# Patient Record
Sex: Male | Born: 1940 | Race: White | Hispanic: No | Marital: Married | Smoking: Never smoker
Health system: Southern US, Community
[De-identification: ages and names within clinical notes are randomized; demographics above are authoritative.]

## PROBLEM LIST (undated history)

## (undated) DIAGNOSIS — I771 Stricture of artery: Secondary | ICD-10-CM

## (undated) DIAGNOSIS — I255 Ischemic cardiomyopathy: Secondary | ICD-10-CM

## (undated) DIAGNOSIS — C61 Malignant neoplasm of prostate: Secondary | ICD-10-CM

## (undated) DIAGNOSIS — Z951 Presence of aortocoronary bypass graft: Secondary | ICD-10-CM

## (undated) DIAGNOSIS — I7121 Aneurysm of the ascending aorta, without rupture: Secondary | ICD-10-CM

## (undated) DIAGNOSIS — Z953 Presence of xenogenic heart valve: Secondary | ICD-10-CM

## (undated) DIAGNOSIS — M1990 Unspecified osteoarthritis, unspecified site: Secondary | ICD-10-CM

## (undated) DIAGNOSIS — I2510 Atherosclerotic heart disease of native coronary artery without angina pectoris: Secondary | ICD-10-CM

## (undated) DIAGNOSIS — I6522 Occlusion and stenosis of left carotid artery: Secondary | ICD-10-CM

## (undated) DIAGNOSIS — I351 Nonrheumatic aortic (valve) insufficiency: Secondary | ICD-10-CM

## (undated) DIAGNOSIS — I712 Thoracic aortic aneurysm, without rupture: Secondary | ICD-10-CM

## (undated) DIAGNOSIS — Z9889 Other specified postprocedural states: Secondary | ICD-10-CM

## (undated) DIAGNOSIS — Z8679 Personal history of other diseases of the circulatory system: Secondary | ICD-10-CM

## (undated) DIAGNOSIS — I6521 Occlusion and stenosis of right carotid artery: Secondary | ICD-10-CM

## (undated) DIAGNOSIS — Z8719 Personal history of other diseases of the digestive system: Secondary | ICD-10-CM

## (undated) HISTORY — PX: TRANSURETHRAL RESECTION OF PROSTATE: SHX73

## (undated) HISTORY — DX: Ischemic cardiomyopathy: I25.5

## (undated) HISTORY — DX: Atherosclerotic heart disease of native coronary artery without angina pectoris: I25.10

---

## 2012-07-10 DIAGNOSIS — Z8546 Personal history of malignant neoplasm of prostate: Secondary | ICD-10-CM | POA: Insufficient documentation

## 2016-08-30 DIAGNOSIS — F39 Unspecified mood [affective] disorder: Secondary | ICD-10-CM | POA: Insufficient documentation

## 2016-08-30 DIAGNOSIS — M810 Age-related osteoporosis without current pathological fracture: Secondary | ICD-10-CM | POA: Insufficient documentation

## 2016-08-30 DIAGNOSIS — K219 Gastro-esophageal reflux disease without esophagitis: Secondary | ICD-10-CM | POA: Insufficient documentation

## 2016-08-30 DIAGNOSIS — G5791 Unspecified mononeuropathy of right lower limb: Secondary | ICD-10-CM | POA: Insufficient documentation

## 2016-12-04 ENCOUNTER — Inpatient Hospital Stay (HOSPITAL_COMMUNITY)
Admission: AD | Admit: 2016-12-04 | Discharge: 2016-12-16 | DRG: 216 | Disposition: A | Discharge status: Home Health | Payer: Medicare Other | Source: Other Acute Inpatient Hospital | Attending: Thoracic Surgery (Cardiothoracic Vascular Surgery) | Admitting: Thoracic Surgery (Cardiothoracic Vascular Surgery)

## 2016-12-04 ENCOUNTER — Inpatient Hospital Stay
Admission: EM | Admit: 2016-12-04 | Discharge: 2016-12-04 | DRG: 280 | Disposition: A | Discharge status: Other Acute Inpatient Hospital | Payer: Medicare Other | Source: Other Acute Inpatient Hospital | Attending: Internal Medicine | Admitting: Internal Medicine

## 2016-12-04 ENCOUNTER — Inpatient Hospital Stay: Payer: Medicare Other

## 2016-12-04 ENCOUNTER — Encounter
Admission: EM | Disposition: A | Discharge status: Other Acute Inpatient Hospital | Payer: Self-pay | Source: Other Acute Inpatient Hospital | Attending: Internal Medicine

## 2016-12-04 ENCOUNTER — Inpatient Hospital Stay
Admission: EM | Admit: 2016-12-04 | Payer: Medicare Other | Source: Other Acute Inpatient Hospital | Admitting: Internal Medicine

## 2016-12-04 DIAGNOSIS — I771 Stricture of artery: Secondary | ICD-10-CM | POA: Diagnosis present

## 2016-12-04 DIAGNOSIS — I130 Hypertensive heart and chronic kidney disease with heart failure and stage 1 through stage 4 chronic kidney disease, or unspecified chronic kidney disease: Secondary | ICD-10-CM | POA: Diagnosis present

## 2016-12-04 DIAGNOSIS — K449 Diaphragmatic hernia without obstruction or gangrene: Secondary | ICD-10-CM | POA: Diagnosis not present

## 2016-12-04 DIAGNOSIS — Z7983 Long term (current) use of bisphosphonates: Secondary | ICD-10-CM

## 2016-12-04 DIAGNOSIS — I110 Hypertensive heart disease with heart failure: Secondary | ICD-10-CM | POA: Diagnosis present

## 2016-12-04 DIAGNOSIS — R1033 Periumbilical pain: Secondary | ICD-10-CM | POA: Diagnosis present

## 2016-12-04 DIAGNOSIS — I272 Pulmonary hypertension, unspecified: Secondary | ICD-10-CM | POA: Diagnosis present

## 2016-12-04 DIAGNOSIS — I509 Heart failure, unspecified: Secondary | ICD-10-CM

## 2016-12-04 DIAGNOSIS — Z8679 Personal history of other diseases of the circulatory system: Secondary | ICD-10-CM

## 2016-12-04 DIAGNOSIS — K219 Gastro-esophageal reflux disease without esophagitis: Secondary | ICD-10-CM | POA: Diagnosis present

## 2016-12-04 DIAGNOSIS — I351 Nonrheumatic aortic (valve) insufficiency: Secondary | ICD-10-CM | POA: Diagnosis present

## 2016-12-04 DIAGNOSIS — R410 Disorientation, unspecified: Secondary | ICD-10-CM | POA: Diagnosis not present

## 2016-12-04 DIAGNOSIS — I160 Hypertensive urgency: Secondary | ICD-10-CM | POA: Diagnosis present

## 2016-12-04 DIAGNOSIS — I2510 Atherosclerotic heart disease of native coronary artery without angina pectoris: Secondary | ICD-10-CM | POA: Diagnosis present

## 2016-12-04 DIAGNOSIS — I6523 Occlusion and stenosis of bilateral carotid arteries: Secondary | ICD-10-CM | POA: Diagnosis present

## 2016-12-04 DIAGNOSIS — Z01810 Encounter for preprocedural cardiovascular examination: Secondary | ICD-10-CM | POA: Diagnosis not present

## 2016-12-04 DIAGNOSIS — N170 Acute kidney failure with tubular necrosis: Secondary | ICD-10-CM | POA: Diagnosis not present

## 2016-12-04 DIAGNOSIS — R001 Bradycardia, unspecified: Secondary | ICD-10-CM | POA: Diagnosis not present

## 2016-12-04 DIAGNOSIS — I340 Nonrheumatic mitral (valve) insufficiency: Secondary | ICD-10-CM | POA: Diagnosis not present

## 2016-12-04 DIAGNOSIS — E876 Hypokalemia: Secondary | ICD-10-CM | POA: Diagnosis not present

## 2016-12-04 DIAGNOSIS — R079 Chest pain, unspecified: Secondary | ICD-10-CM

## 2016-12-04 DIAGNOSIS — Z8546 Personal history of malignant neoplasm of prostate: Secondary | ICD-10-CM | POA: Diagnosis not present

## 2016-12-04 DIAGNOSIS — I214 Non-ST elevation (NSTEMI) myocardial infarction: Principal | ICD-10-CM | POA: Diagnosis present

## 2016-12-04 DIAGNOSIS — I6521 Occlusion and stenosis of right carotid artery: Secondary | ICD-10-CM | POA: Diagnosis present

## 2016-12-04 DIAGNOSIS — I5032 Chronic diastolic (congestive) heart failure: Secondary | ICD-10-CM | POA: Diagnosis present

## 2016-12-04 DIAGNOSIS — I714 Abdominal aortic aneurysm, without rupture: Secondary | ICD-10-CM | POA: Diagnosis present

## 2016-12-04 DIAGNOSIS — E1122 Type 2 diabetes mellitus with diabetic chronic kidney disease: Secondary | ICD-10-CM | POA: Diagnosis present

## 2016-12-04 DIAGNOSIS — I25110 Atherosclerotic heart disease of native coronary artery with unstable angina pectoris: Secondary | ICD-10-CM | POA: Diagnosis not present

## 2016-12-04 DIAGNOSIS — I6522 Occlusion and stenosis of left carotid artery: Secondary | ICD-10-CM | POA: Diagnosis present

## 2016-12-04 DIAGNOSIS — E7800 Pure hypercholesterolemia, unspecified: Secondary | ICD-10-CM | POA: Diagnosis not present

## 2016-12-04 DIAGNOSIS — Z9079 Acquired absence of other genital organ(s): Secondary | ICD-10-CM

## 2016-12-04 DIAGNOSIS — H547 Unspecified visual loss: Secondary | ICD-10-CM | POA: Diagnosis not present

## 2016-12-04 DIAGNOSIS — Z7982 Long term (current) use of aspirin: Secondary | ICD-10-CM

## 2016-12-04 DIAGNOSIS — E785 Hyperlipidemia, unspecified: Secondary | ICD-10-CM | POA: Diagnosis not present

## 2016-12-04 DIAGNOSIS — I472 Ventricular tachycardia: Secondary | ICD-10-CM | POA: Diagnosis present

## 2016-12-04 DIAGNOSIS — Z8673 Personal history of transient ischemic attack (TIA), and cerebral infarction without residual deficits: Secondary | ICD-10-CM

## 2016-12-04 DIAGNOSIS — I708 Atherosclerosis of other arteries: Secondary | ICD-10-CM | POA: Diagnosis not present

## 2016-12-04 DIAGNOSIS — Z9889 Other specified postprocedural states: Secondary | ICD-10-CM

## 2016-12-04 DIAGNOSIS — R109 Unspecified abdominal pain: Secondary | ICD-10-CM

## 2016-12-04 DIAGNOSIS — I252 Old myocardial infarction: Secondary | ICD-10-CM

## 2016-12-04 DIAGNOSIS — M1990 Unspecified osteoarthritis, unspecified site: Secondary | ICD-10-CM | POA: Diagnosis present

## 2016-12-04 DIAGNOSIS — I471 Supraventricular tachycardia: Secondary | ICD-10-CM | POA: Diagnosis present

## 2016-12-04 DIAGNOSIS — M1611 Unilateral primary osteoarthritis, right hip: Secondary | ICD-10-CM | POA: Diagnosis not present

## 2016-12-04 DIAGNOSIS — I10 Essential (primary) hypertension: Secondary | ICD-10-CM | POA: Diagnosis not present

## 2016-12-04 DIAGNOSIS — D62 Acute posthemorrhagic anemia: Secondary | ICD-10-CM | POA: Diagnosis not present

## 2016-12-04 DIAGNOSIS — N182 Chronic kidney disease, stage 2 (mild): Secondary | ICD-10-CM | POA: Diagnosis present

## 2016-12-04 DIAGNOSIS — I712 Thoracic aortic aneurysm, without rupture: Secondary | ICD-10-CM | POA: Diagnosis present

## 2016-12-04 DIAGNOSIS — J9811 Atelectasis: Secondary | ICD-10-CM

## 2016-12-04 DIAGNOSIS — Z951 Presence of aortocoronary bypass graft: Secondary | ICD-10-CM

## 2016-12-04 DIAGNOSIS — M545 Low back pain: Secondary | ICD-10-CM | POA: Diagnosis present

## 2016-12-04 DIAGNOSIS — N289 Disorder of kidney and ureter, unspecified: Secondary | ICD-10-CM | POA: Diagnosis present

## 2016-12-04 DIAGNOSIS — I25118 Atherosclerotic heart disease of native coronary artery with other forms of angina pectoris: Secondary | ICD-10-CM | POA: Diagnosis present

## 2016-12-04 DIAGNOSIS — Z79899 Other long term (current) drug therapy: Secondary | ICD-10-CM

## 2016-12-04 DIAGNOSIS — R21 Rash and other nonspecific skin eruption: Secondary | ICD-10-CM | POA: Diagnosis not present

## 2016-12-04 DIAGNOSIS — I5021 Acute systolic (congestive) heart failure: Secondary | ICD-10-CM | POA: Diagnosis present

## 2016-12-04 DIAGNOSIS — Z952 Presence of prosthetic heart valve: Secondary | ICD-10-CM

## 2016-12-04 DIAGNOSIS — Z9689 Presence of other specified functional implants: Secondary | ICD-10-CM

## 2016-12-04 DIAGNOSIS — I739 Peripheral vascular disease, unspecified: Secondary | ICD-10-CM | POA: Diagnosis present

## 2016-12-04 DIAGNOSIS — I7121 Aneurysm of the ascending aorta, without rupture: Secondary | ICD-10-CM | POA: Diagnosis present

## 2016-12-04 HISTORY — DX: Occlusion and stenosis of right carotid artery: I65.21

## 2016-12-04 HISTORY — DX: Malignant neoplasm of prostate: C61

## 2016-12-04 HISTORY — DX: Presence of aortocoronary bypass graft: Z95.1

## 2016-12-04 HISTORY — DX: Presence of xenogenic heart valve: Z95.3

## 2016-12-04 HISTORY — DX: Unspecified osteoarthritis, unspecified site: M19.90

## 2016-12-04 HISTORY — DX: Other specified postprocedural states: Z98.890

## 2016-12-04 HISTORY — DX: Personal history of other diseases of the circulatory system: Z86.79

## 2016-12-04 HISTORY — DX: Thoracic aortic aneurysm, without rupture: I71.2

## 2016-12-04 HISTORY — DX: Personal history of other diseases of the digestive system: Z87.19

## 2016-12-04 HISTORY — DX: Stricture of artery: I77.1

## 2016-12-04 HISTORY — PX: LEFT HEART CATH AND CORONARY ANGIOGRAPHY: CATH118249

## 2016-12-04 HISTORY — DX: Nonrheumatic aortic (valve) insufficiency: I35.1

## 2016-12-04 HISTORY — DX: Occlusion and stenosis of left carotid artery: I65.22

## 2016-12-04 HISTORY — DX: Aneurysm of the ascending aorta, without rupture: I71.21

## 2016-12-04 LAB — COMPREHENSIVE METABOLIC PANEL
ALT: 17 U/L (ref 17–63)
AST: 34 U/L (ref 15–41)
Albumin: 4.2 g/dL (ref 3.5–5.0)
Alkaline Phosphatase: 80 U/L (ref 38–126)
Anion gap: 13 (ref 5–15)
BUN: 21 mg/dL — ABNORMAL HIGH (ref 6–20)
CO2: 24 mmol/L (ref 22–32)
Calcium: 9.3 mg/dL (ref 8.9–10.3)
Chloride: 103 mmol/L (ref 101–111)
Creatinine, Ser: 1.26 mg/dL — ABNORMAL HIGH (ref 0.61–1.24)
GFR calc Af Amer: >60 mL/min (ref >60)
GFR calc non Af Amer: 54 mL/min — ABNORMAL LOW (ref >60)
Glucose, Bld: 110 mg/dL — ABNORMAL HIGH (ref 65–99)
Potassium: 3.3 mmol/L — ABNORMAL LOW (ref 3.5–5.1)
Sodium: 140 mmol/L (ref 135–145)
Total Bilirubin: 0.8 mg/dL (ref 0.3–1.2)
Total Protein: 7.8 g/dL (ref 6.5–8.1)

## 2016-12-04 LAB — CBC
HCT: 36.4 % — ABNORMAL LOW (ref 40.0–52.0)
Hemoglobin: 12.4 g/dL — ABNORMAL LOW (ref 13.0–18.0)
MCH: 30.7 pg (ref 26.0–34.0)
MCHC: 34 g/dL (ref 32.0–36.0)
MCV: 90.5 fL (ref 80.0–100.0)
Platelets: 274 10*3/uL (ref 150–440)
RBC: 4.02 MIL/uL — ABNORMAL LOW (ref 4.40–5.90)
RDW: 14.3 % (ref 11.5–14.5)
WBC: 11.7 10*3/uL — ABNORMAL HIGH (ref 3.8–10.6)

## 2016-12-04 LAB — TSH: TSH: 1.969 u[IU]/mL (ref 0.350–4.500)

## 2016-12-04 LAB — TROPONIN I
TROPONIN I: 0.59 ng/mL — AB (ref <0.03)
Troponin I: 0.57 ng/mL (ref <0.03)
Troponin I: 0.38 ng/mL (ref <0.03)

## 2016-12-04 LAB — HEPARIN LEVEL (UNFRACTIONATED): Heparin Unfractionated: 0.38 IU/mL (ref 0.30–0.70)

## 2016-12-04 LAB — HEMOGLOBIN A1C
Hgb A1c MFr Bld: 5.7 % — ABNORMAL HIGH (ref 4.8–5.6)
Mean Plasma Glucose: 116.89 mg/dL

## 2016-12-04 LAB — PROTIME-INR
INR: 1.03
Prothrombin Time: 13.4 seconds (ref 11.4–15.2)

## 2016-12-04 SURGERY — LEFT HEART CATH AND CORONARY ANGIOGRAPHY
Anesthesia: Moderate Sedation

## 2016-12-04 MED ORDER — ATORVASTATIN CALCIUM 80 MG PO TABS: 80.0000 mg | ORAL_TABLET | Freq: Every day | ORAL | Status: DC

## 2016-12-04 MED ORDER — AMLODIPINE BESYLATE 5 MG PO TABS
5.0000 mg | ORAL_TABLET | Freq: Every day | ORAL | Status: DC
Start: 2016-12-05 — End: 2016-12-07
  Administered 2016-12-05 – 2016-12-06 (×2): 5 mg via ORAL
  Filled 2016-12-04 (×2): qty 1

## 2016-12-04 MED ORDER — ONDANSETRON HCL 4 MG/2ML IJ SOLN: 4.0000 mg | Freq: Four times a day (QID) | INTRAMUSCULAR | Status: DC | PRN

## 2016-12-04 MED ORDER — SODIUM CHLORIDE 0.9% FLUSH: 3.0000 mL | INTRAVENOUS | Status: DC | PRN

## 2016-12-04 MED ORDER — ATORVASTATIN CALCIUM 80 MG PO TABS
80.0000 mg | ORAL_TABLET | Freq: Every day | ORAL | Status: DC
  Administered 2016-12-05 – 2016-12-06 (×2): 80 mg via ORAL
  Filled 2016-12-04 (×2): qty 1

## 2016-12-04 MED ORDER — CARVEDILOL 6.25 MG PO TABS
6.2500 mg | ORAL_TABLET | Freq: Two times a day (BID) | ORAL | Status: DC
  Administered 2016-12-05 – 2016-12-06 (×4): 6.25 mg via ORAL
  Filled 2016-12-04 (×4): qty 1

## 2016-12-04 MED ORDER — VERAPAMIL HCL 2.5 MG/ML IV SOLN
INTRAVENOUS | Status: DC | PRN
  Administered 2016-12-04: 2.5 mg via INTRA_ARTERIAL

## 2016-12-04 MED ORDER — ASPIRIN 81 MG PO CHEW
81.0000 mg | CHEWABLE_TABLET | Freq: Every day | ORAL | Status: DC
  Administered 2016-12-04: 81 mg via ORAL
  Filled 2016-12-04: qty 1

## 2016-12-04 MED ORDER — ACETAMINOPHEN 325 MG PO TABS: 650.0000 mg | ORAL_TABLET | ORAL | Status: DC | PRN

## 2016-12-04 MED ORDER — ACETAMINOPHEN 325 MG PO TABS: 650.0000 mg | ORAL_TABLET | Freq: Four times a day (QID) | ORAL | Status: DC | PRN

## 2016-12-04 MED ORDER — SODIUM CHLORIDE 0.9% FLUSH
3.0000 mL | Freq: Two times a day (BID) | INTRAVENOUS | Status: DC
  Administered 2016-12-04: 3 mL via INTRAVENOUS

## 2016-12-04 MED ORDER — ATORVASTATIN CALCIUM 20 MG PO TABS
80.0000 mg | ORAL_TABLET | Freq: Every day | ORAL | Status: DC
  Administered 2016-12-04: 80 mg via ORAL
  Filled 2016-12-04: qty 4

## 2016-12-04 MED ORDER — SODIUM CHLORIDE 0.9% FLUSH
3.0000 mL | Freq: Two times a day (BID) | INTRAVENOUS | Status: DC
  Administered 2016-12-05 – 2016-12-06 (×3): 3 mL via INTRAVENOUS

## 2016-12-04 MED ORDER — ASPIRIN 81 MG PO CHEW: 81.0000 mg | CHEWABLE_TABLET | ORAL | Status: DC

## 2016-12-04 MED ORDER — MIDAZOLAM HCL 2 MG/2ML IJ SOLN
INTRAMUSCULAR | Status: AC
  Filled 2016-12-04: qty 2

## 2016-12-04 MED ORDER — NITROGLYCERIN 2 % TD OINT: 1.0000 [in_us] | TOPICAL_OINTMENT | Freq: Four times a day (QID) | TRANSDERMAL | 0 refills | Status: DC

## 2016-12-04 MED ORDER — HYDRALAZINE HCL 20 MG/ML IJ SOLN: 10.0000 mg | Freq: Four times a day (QID) | INTRAMUSCULAR | Status: DC | PRN

## 2016-12-04 MED ORDER — SODIUM CHLORIDE 0.9 % IV SOLN: 250.0000 mL | INTRAVENOUS | Status: DC | PRN

## 2016-12-04 MED ORDER — POTASSIUM CHLORIDE 20 MEQ PO PACK: 20.0000 meq | PACK | Freq: Once | ORAL | Status: DC

## 2016-12-04 MED ORDER — ONDANSETRON HCL 4 MG PO TABS: 4.0000 mg | ORAL_TABLET | Freq: Four times a day (QID) | ORAL | Status: DC | PRN

## 2016-12-04 MED ORDER — HEPARIN (PORCINE) IN NACL 2-0.9 UNIT/ML-% IJ SOLN
INTRAMUSCULAR | Status: AC
  Filled 2016-12-04: qty 500

## 2016-12-04 MED ORDER — HEPARIN BOLUS VIA INFUSION
4000.0000 [IU] | Freq: Once | INTRAVENOUS | Status: DC
  Filled 2016-12-04: qty 4000

## 2016-12-04 MED ORDER — SODIUM CHLORIDE 0.9 % WEIGHT BASED INFUSION: 1.0000 mL/kg/h | INTRAVENOUS | Status: DC

## 2016-12-04 MED ORDER — NITROGLYCERIN 0.4 MG SL SUBL: 0.4000 mg | SUBLINGUAL_TABLET | SUBLINGUAL | Status: DC | PRN

## 2016-12-04 MED ORDER — NITROGLYCERIN 2 % TD OINT
1.0000 [in_us] | TOPICAL_OINTMENT | Freq: Four times a day (QID) | TRANSDERMAL | Status: DC
  Administered 2016-12-04: 1 [in_us] via TOPICAL
  Filled 2016-12-04 (×2): qty 1

## 2016-12-04 MED ORDER — ASPIRIN EC 81 MG PO TBEC
81.0000 mg | DELAYED_RELEASE_TABLET | Freq: Every day | ORAL | Status: DC
  Administered 2016-12-05 – 2016-12-06 (×2): 81 mg via ORAL
  Filled 2016-12-04 (×2): qty 1

## 2016-12-04 MED ORDER — HEPARIN SODIUM (PORCINE) 1000 UNIT/ML IJ SOLN
INTRAMUSCULAR | Status: AC
  Filled 2016-12-04: qty 1

## 2016-12-04 MED ORDER — FENTANYL CITRATE (PF) 100 MCG/2ML IJ SOLN
INTRAMUSCULAR | Status: AC
  Filled 2016-12-04: qty 2

## 2016-12-04 MED ORDER — HEPARIN (PORCINE) IN NACL 100-0.45 UNIT/ML-% IJ SOLN
850.0000 [IU]/h | INTRAMUSCULAR | Status: DC
  Administered 2016-12-04: 850 [IU]/h via INTRAVENOUS

## 2016-12-04 MED ORDER — FAMOTIDINE 20 MG PO TABS
20.0000 mg | ORAL_TABLET | Freq: Every day | ORAL | Status: DC
  Administered 2016-12-04: 20 mg via ORAL
  Filled 2016-12-04: qty 1

## 2016-12-04 MED ORDER — SODIUM CHLORIDE 0.9 % WEIGHT BASED INFUSION: 3.0000 mL/kg/h | INTRAVENOUS | Status: DC

## 2016-12-04 MED ORDER — SODIUM CHLORIDE 0.9% FLUSH: 3.0000 mL | Freq: Two times a day (BID) | INTRAVENOUS | Status: DC

## 2016-12-04 MED ORDER — CARVEDILOL 6.25 MG PO TABS: 6.2500 mg | ORAL_TABLET | Freq: Two times a day (BID) | ORAL | Status: DC

## 2016-12-04 MED ORDER — VERAPAMIL HCL 2.5 MG/ML IV SOLN
INTRAVENOUS | Status: AC
  Filled 2016-12-04: qty 2

## 2016-12-04 MED ORDER — IOPAMIDOL (ISOVUE-300) INJECTION 61%
INTRAVENOUS | Status: DC | PRN
  Administered 2016-12-04: 90 mL via INTRA_ARTERIAL

## 2016-12-04 MED ORDER — FENTANYL CITRATE (PF) 100 MCG/2ML IJ SOLN
INTRAMUSCULAR | Status: DC | PRN
  Administered 2016-12-04: 50 ug via INTRAVENOUS

## 2016-12-04 MED ORDER — MIDAZOLAM HCL 2 MG/2ML IJ SOLN
INTRAMUSCULAR | Status: DC | PRN
  Administered 2016-12-04: 1 mg via INTRAVENOUS

## 2016-12-04 MED ORDER — ACETAMINOPHEN 650 MG RE SUPP: 650.0000 mg | Freq: Four times a day (QID) | RECTAL | Status: DC | PRN

## 2016-12-04 MED ORDER — NITROGLYCERIN 2 % TD OINT
1.0000 [in_us] | TOPICAL_OINTMENT | Freq: Four times a day (QID) | TRANSDERMAL | Status: DC
  Administered 2016-12-04 – 2016-12-07 (×10): 1 [in_us] via TOPICAL
  Filled 2016-12-04: qty 30

## 2016-12-04 MED ORDER — DOCUSATE SODIUM 100 MG PO CAPS
100.0000 mg | ORAL_CAPSULE | Freq: Two times a day (BID) | ORAL | Status: DC
  Administered 2016-12-04: 100 mg via ORAL
  Filled 2016-12-04: qty 1

## 2016-12-04 MED ORDER — HEPARIN (PORCINE) IN NACL 100-0.45 UNIT/ML-% IJ SOLN: 850.0000 [IU]/h | INTRAMUSCULAR | Status: DC

## 2016-12-04 MED ORDER — CARVEDILOL 6.25 MG PO TABS
6.2500 mg | ORAL_TABLET | Freq: Two times a day (BID) | ORAL | Status: DC
  Administered 2016-12-04: 6.25 mg via ORAL
  Filled 2016-12-04: qty 1

## 2016-12-04 MED ORDER — SODIUM CHLORIDE 0.9 % IV SOLN
INTRAVENOUS | Status: DC
  Administered 2016-12-04: 06:00:00 via INTRAVENOUS

## 2016-12-04 MED ORDER — AMLODIPINE BESYLATE 5 MG PO TABS
5.0000 mg | ORAL_TABLET | Freq: Every day | ORAL | Status: DC
  Administered 2016-12-04: 5 mg via ORAL
  Filled 2016-12-04: qty 1

## 2016-12-04 MED ORDER — ASPIRIN 81 MG PO CHEW: 81.0000 mg | CHEWABLE_TABLET | Freq: Every day | ORAL | Status: DC

## 2016-12-04 MED ORDER — SODIUM CHLORIDE 0.9 % IV SOLN
INTRAVENOUS | Status: AC
  Administered 2016-12-04: 19:00:00 via INTRAVENOUS

## 2016-12-04 MED ORDER — HEPARIN SODIUM (PORCINE) 1000 UNIT/ML IJ SOLN
INTRAMUSCULAR | Status: DC | PRN
  Administered 2016-12-04: 3500 [IU] via INTRAVENOUS

## 2016-12-04 MED ORDER — HEPARIN (PORCINE) IN NACL 100-0.45 UNIT/ML-% IJ SOLN
850.0000 [IU]/h | INTRAMUSCULAR | Status: DC
  Administered 2016-12-05 – 2016-12-06 (×2): 850 [IU]/h via INTRAVENOUS
  Filled 2016-12-04 (×2): qty 250

## 2016-12-04 SURGICAL SUPPLY — 8 items
CATH AMP RT 5F (CATHETERS) ×3 IMPLANT
CATH INFINITI 5 FR JL3.5 (CATHETERS) ×3 IMPLANT
CATH INFINITI JR4 5F (CATHETERS) ×3 IMPLANT
DEVICE RAD TR BAND REGULAR (VASCULAR PRODUCTS) ×3 IMPLANT
GLIDESHEATH SLEND SS 6F .021 (SHEATH) ×3 IMPLANT
KIT MANI 3VAL PERCEP (MISCELLANEOUS) ×3 IMPLANT
PACK CARDIAC CATH (CUSTOM PROCEDURE TRAY) ×3 IMPLANT
WIRE ROSEN-J .035X260CM (WIRE) ×3 IMPLANT

## 2016-12-04 NOTE — Progress Notes (Signed)
Chaplain responded to an OR for prayer. [place] visit with pt but pt was not available. CH met wife and daughter who states that pt was taken to ultrasound. [place] will follow up pt as needed.   12/04/16 1000  Clinical Encounter Type  Visited With Family  Visit Type Initial;Spiritual support  Referral From Nurse  Consult/Referral To Chaplain  Spiritual Encounters  Spiritual Needs Prayer

## 2016-12-04 NOTE — Care Management (Signed)
Direct admit from [facility] to received cardiac care not available at [facility].

## 2016-12-04 NOTE — H&P (Signed)
Kevin Burgess is an 76 y.o. male.   Chief Complaint: Chest pain HPI: the patient with past medical history of prostate cancer presents to [facility] from [facility] for higher level of care. The patient developed chest pain at rest earlier this evening. Troponin at the other facility was 0.065 and EKG showed ST depressions. Dr. Audelia Acton of [facility] was contacted and agreed the patient may need catheterization. He was placed on a heparin drip en route. Upon arrival the patient was asymptomatic (chest pain resolved hours ago with sublingual nitroglycerin). He has also been short of breath for at least a week. CXR at the outside hospital showed pulmonary edema which improved with Lasix. He has no oxygen requirement at this time.   No past medical history on file.  No past surgical history on file.  No family history on file. Social History:  reports that he has never smoked. He has never used smokeless tobacco. He reports that he does not drink alcohol or use drugs.  Allergies: No Known Allergies  No prescriptions prior to admission.    Results for orders placed or performed during the hospital encounter of 12/04/16 (from the past 48 hour(s))  TSH     Status: None   Collection Time: 12/04/16  5:07 AM  Result Value Ref Range   TSH 1.969 0.350 - 4.500 uIU/mL    Comment: Performed by a 3rd Generation assay with a functional sensitivity of <=0.01 uIU/mL.  Troponin I     Status: Abnormal   Collection Time: 12/04/16  5:07 AM  Result Value Ref Range   Troponin I 0.59 (HH) <0.03 ng/mL    Comment: CRITICAL RESULT CALLED TO, READ BACK BY AND VERIFIED WITH ALISA SCOTT @ 0615 ON 12/04/2016 BY CAF   Comprehensive metabolic panel     Status: Abnormal   Collection Time: 12/04/16  5:07 AM  Result Value Ref Range   Sodium 140 135 - 145 mmol/L   Potassium 3.3 (L) 3.5 - 5.1 mmol/L   Chloride 103 101 - 111 mmol/L   CO2 24 22 - 32 mmol/L   Glucose, Bld 110 (H) 65 - 99 mg/dL   BUN 21 (H) 6 - 20 mg/dL   Creatinine, Ser 1.26 (H) 0.61 - 1.24 mg/dL   Calcium 9.3 8.9 - 10.3 mg/dL   Total Protein 7.8 6.5 - 8.1 g/dL   Albumin 4.2 3.5 - 5.0 g/dL   AST 34 15 - 41 U/L   ALT 17 17 - 63 U/L   Alkaline Phosphatase 80 38 - 126 U/L   Total Bilirubin 0.8 0.3 - 1.2 mg/dL   GFR calc non Af Amer 54 (L) >60 mL/min   GFR calc Af Amer >60 >60 mL/min    Comment: (NOTE) The eGFR has been calculated using the CKD EPI equation. This calculation has not been validated in all clinical situations. eGFR's persistently <60 mL/min signify possible Chronic Kidney Disease.    Anion gap 13 5 - 15  Protime-INR     Status: None   Collection Time: 12/04/16  5:07 AM  Result Value Ref Range   Prothrombin Time 13.4 11.4 - 15.2 seconds   INR 1.03   CBC     Status: Abnormal   Collection Time: 12/04/16  5:07 AM  Result Value Ref Range   WBC 11.7 (H) 3.8 - 10.6 K/uL   RBC 4.02 (L) 4.40 - 5.90 MIL/uL   Hemoglobin 12.4 (L) 13.0 - 18.0 g/dL   HCT 36.4 (L) 40.0 - 52.0 %  MCV 90.5 80.0 - 100.0 fL   MCH 30.7 26.0 - 34.0 pg   MCHC 34.0 32.0 - 36.0 g/dL   RDW 14.3 11.5 - 14.5 %   Platelets 274 150 - 440 K/uL  Heparin level (unfractionated)     Status: None   Collection Time: 12/04/16  5:07 AM  Result Value Ref Range   Heparin Unfractionated 0.38 0.30 - 0.70 IU/mL    Comment:        IF HEPARIN RESULTS ARE BELOW EXPECTED VALUES, AND PATIENT DOSAGE HAS BEEN CONFIRMED, SUGGEST FOLLOW UP TESTING OF ANTITHROMBIN III LEVELS.    No results found.  Review of Systems  Constitutional: Positive for diaphoresis. Negative for chills and fever.  HENT: Negative for sore throat and tinnitus.   Eyes: Negative for blurred vision and redness.  Respiratory: Positive for shortness of breath. Negative for cough.   Cardiovascular: Negative for chest pain, palpitations, orthopnea and PND.  Gastrointestinal: Positive for abdominal pain (resolved). Negative for diarrhea, nausea and vomiting.  Genitourinary: Negative for dysuria,  frequency and urgency.  Musculoskeletal: Negative for joint pain and myalgias.  Skin: Negative for rash.       No lesions  Neurological: Negative for speech change, focal weakness and weakness.  Endo/Heme/Allergies: Does not bruise/bleed easily.       No temperature intolerance  Psychiatric/Behavioral: Negative for depression and suicidal ideas.    Blood pressure (!) 188/86, pulse 93, temperature 98.1 F (36.7 C), temperature source Oral, resp. rate (!) 22, height 5' 7"  (1.702 m), weight 69.8 kg (153 lb 14.4 oz), SpO2 100 %. Physical Exam  Vitals reviewed. Constitutional: He is oriented to person, place, and time. He appears well-developed and well-nourished. No distress.  HENT:  Head: Normocephalic and atraumatic.  Mouth/Throat: Oropharynx is clear and moist.  Eyes: Pupils are equal, round, and reactive to light. Conjunctivae and EOM are normal. No scleral icterus.  Neck: Normal range of motion. Neck supple. No JVD present. No tracheal deviation present. No thyromegaly present.  Cardiovascular: Normal rate, regular rhythm and normal heart sounds.  Exam reveals no gallop and no friction rub.   No murmur heard. Respiratory: Effort normal and breath sounds normal. No respiratory distress. He has no wheezes.  GI: Soft. Bowel sounds are normal. He exhibits no distension. There is no tenderness.  Genitourinary:  Genitourinary Comments: Deferred  Musculoskeletal: Normal range of motion. He exhibits no edema.  Lymphadenopathy:    He has no cervical adenopathy.  Neurological: He is alert and oriented to person, place, and time. No cranial nerve deficit.  Skin: Skin is warm and dry. No rash noted. No erythema.  Psychiatric: He has a normal mood and affect. His behavior is normal. Judgment and thought content normal.     Assessment/Plan This is a 76 year old male admitted for NSTEMI. 1. NSTEMI: anterior lateral ST abnormalities with elevated troponin. Continue heparin drip. He is chest  pain-free. Cardiology is consulted. Monitor telemetry. 2. CHF: acute; systolic. 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 Pulmonary edema improved following Lasix. Assess EF during cath.  3. CAD: troponin trending downward. Check HbA1c and lipids 4. Hypertension: uncontrolled; start Coreg 5. GERD: Pepcid 6. DVT prophylaxis: as above 7. GI prophylaxis: as above The patient is a full code. Time spent on admission orders and patient care approximately 45 minutes.  Harrie Foreman, MD 12/04/2016, 7:20 AM

## 2016-12-04 NOTE — H&P (View-Only) (Signed)
Cardiology Consult    Patient ID: Kevin Burgess MRN: [id number], DOB/AGE: August 19, 1940   Admit date: 12/04/2016 Date of Consult: 12/04/2016  Primary Physician: [name], MD Primary Cardiologist: new - C. End, MD  Requesting Provider: S. Patel  Patient Profile    Kevin Burgess is a 76 y.o. male with a history of prostate cancer and probably untreated HTN, who is being seen today for the evaluation of NSTEMI at the request of Dr. Serita Grit.  Past Medical History   Past Medical History:  Diagnosis Date  . OA (osteoarthritis)    a. Pending R Hip THA  . Prostate cancer ([place])    a. s/p TURP ~ 1999.    Past Surgical History:  Procedure Laterality Date  . TRANSURETHRAL RESECTION OF PROSTATE     a. ~ 1999     Allergies  No Known Allergies  History of Present Illness    76 year old male without prior cardiac history. Does have a history of untreated hypertension, arthritis of the right hip, pending replacement, and prostate cancer status post TURP in 1999. Patient does not typically see doctors but in June of this year, was seen by primary care physician because of lower back and right hip pain. He subsequently underwent imaging which showed osteoarthritis of the right hip and he has been followed by orthopedics. He is tentatively scheduled for right total hip arthroplasty on October 9. His activity has been limited in that setting and recently, his wife has noticed that he has been getting short of breath with activity. He was otherwise in his usual state of health until the evening of September 24, with approximate 6 PM, he developed periumbilical pain that was followed by dyspnea, cough, and clear sputum/foaming from his mouth. The symptoms lasted for about 2+ hours prior to them calling their grandson who then drove him to [facility]. There, his chest x-ray showed pulmonary edema while blood pressure was elevated at 170/91. Troponin was mildly elevated at 0.065. He was  treated with sublingual nitroglycerin upon arrival with complete resolution of all symptoms. He was subsequent transferred to [facility] for further evaluation as there were no beds at [facility], [place], or [place]. Here, patient has been pain-free but has remained hypertensive. Follow-up troponin was higher at 0.59. Of note, though he denies prior episodes of chest discomfort, his wife said that last night he was holding his chest some and in the past has reported chest discomfort as well.  Inpatient Medications    . amLODipine  5 mg Oral Daily  . aspirin  81 mg Oral Daily  . [START ON 12/05/2016] aspirin  81 mg Oral Pre-Cath  . atorvastatin  80 mg Oral q1800  . carvedilol  6.25 mg Oral BID WC  . docusate sodium  100 mg Oral BID  . famotidine  20 mg Oral Daily  . nitroGLYCERIN  1 inch Topical Q6H  . potassium chloride  20 mEq Oral Once  . sodium chloride flush  3 mL Intravenous Q12H    Family History    No premature coronary artery disease. Both parents are deceased.  Social History    Social History   Social History  . Marital status: Married    Spouse name: N/A  . Number of children: N/A  . Years of education: N/A   Occupational History  . 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 Smoking status: Never Smoker  .  Smokeless tobacco: Never Used  . Alcohol use No  . Drug use: No  . Sexual activity: Not on file   Other Topics Concern  . Not on file   Social History Narrative   [place] of [place]  Raises chickens.     Review of Systems    General:  No chills, fever, night sweats or weight changes.  Cardiovascular:  +++ chest pain, +++ dyspnea on exertion, no edema, orthopnea, palpitations, paroxysmal nocturnal dyspnea. Dermatological: No rash, lesions/masses Respiratory: +++ cough, +++ dyspnea Urologic: No hematuria, dysuria Abdominal:   Abdominal/periumbilical discomfort last  night. No nausea, vomiting, diarrhea, bright red blood per rectum, melena, or hematemesis Neurologic:  No visual changes, wkns, changes in mental status. All other systems reviewed and are otherwise negative except as noted above.  Physical Exam    Blood pressure (!) 157/75, pulse 81, temperature 98.3 F (36.8 C), temperature source Oral, resp. rate 18, height 5\' 7"  (1.702 m), weight 153 lb 14.4 oz (69.8 kg), SpO2 95 %.  General: Pleasant, NAD Psych: Normal affect. Neuro: Alert and oriented X 3. Moves all extremities spontaneously. HEENT: Normal  Neck: Supple without bruits or JVD. Lungs:  Resp regular and unlabored, CTA. Heart: RRR no s3, s4, or murmurs. Abdomen: protuberant, firm, non-tender, BS + x 4.  Extremities: No clubbing, cyanosis or edema. DP/PT/Radials 2+ and equal bilaterally.  Labs     Recent Labs  12/04/16 0507 12/04/16 1106  TROPONINI 0.59* 0.57*   Lab Results  Component Value Date   WBC 11.7 (H) 12/04/2016   HGB 12.4 (L) 12/04/2016   HCT 36.4 (L) 12/04/2016   MCV 90.5 12/04/2016   PLT 274 12/04/2016     Recent Labs Lab 12/04/16 0507  NA 140  K 3.3*  CL 103  CO2 24  BUN 21*  CREATININE 1.26*  CALCIUM 9.3  PROT 7.8  BILITOT 0.8  ALKPHOS 80  ALT 17  AST 34  GLUCOSE 110*    Radiology Studies    [place] Retroperitoneal Comp  Result Date: 12/04/2016 CLINICAL DATA:  Abdominal pain. EXAM: ULTRASOUND RETROPERITONEAL COMPLETE TECHNIQUE: Ultrasound examination of the abdominal aorta was performed to evaluate for abdominal aortic aneurysm. The common iliac arteries, IVC, and kidneys were also evaluated. COMPARISON:  None. FINDINGS: Abdominal Aorta No aneurysm identified. Maximum AP Diameter:  2.3 cm. Maximum TRV Diameter: 2.0 cm. Right Common Iliac Artery No aneurysm identified. Left Common Iliac Artery No aneurysm identified. IVC No abnormality visualized. Right Kidney Length: 9.3 cm Echogenicity within normal limits. No mass or hydronephrosis visualized. Left  Kidney Length: 9 cm Echogenicity within normal limits. No mass or hydronephrosis visualized. IMPRESSION: No definite abnormality seen in the visualized retroperitoneum. No abdominal aortic aneurysm is noted. Normal kidneys. Electronically Signed   By: Marijo Conception, M.D.   On: 12/04/2016 10:34    ECG & Cardiac Imaging    Sinus tach, 104, LAE, LVH, repol abnormalities  Assessment & Plan    1.  NSTEMI:  Pt presented on tx from [facility] after presenting with periumbilical pain, HTN urgency, dyspnea, and productive cough.  CXR suggestive of pulm infiltrates/edema.  Ss resolved with sl NTG.  Trop mildly elevated initially.  Tx to [facility].  Trop now 0.59.  No recurrent Ss.  BP remains high - 188/86.  In the setting of periumbilical pain, I did obtain an abdominal vascular ultrasound which shows no evidence of abdominal aortic aneurysm. We will therefore plan on diagnostic catheterization today.  The patient understands that risks include but are not limited to stroke (1 in 1000), death (1 in 43), kidney failure [usually temporary] (1 in 500), bleeding (1 in 200), allergic reaction [possibly serious] (1 in 200), and agrees to proceed.  Cont  blocker.  Adding asa, statin, NTP.  Hold off on ACEI/ARB for now with mild renal insuff.  F/u echo.    2.  Abd Pain:  Pt presented w/ abd/peri-umbilical pain in the setting of marked HTN.  Based on ECG, w/ LVH, he likely has a long h/o untreated HTN (does not routinely seek medical care). Abdominal ultrasound negative for aneurysm.  BP mgmt as below.  3.  Hypertensive Urgency:  In setting of above.  Blood pressure now in the 150s with addition of  blocker, ccb, nitrate, and prn hydralazine.  Eventual ARB but holding off for now in setting of mild renal insuff.  4.  Lipids:  F/u lipids.  Adding high potency statin in setting of #1.  5.  Renal Insufficiency:  ? Chronicity.  Creat mildly elevated @ 1.26.  Follow.  6.  OA:  Pending R THA in [place] currently  sched for 10/9.  As above, needs cardiac eval/cath.  Pending results, surgery may need to be delayed.  Signed, Murray Hodgkins, NP 12/04/2016, 1:15 PM  For questions or updates, please contact   Please consult [email or web address] for contact info under Cardiology/STEMI.

## 2016-12-04 NOTE — Progress Notes (Signed)
Dr Marcille Blanco made aware of troponin results.  No new orders at this time.

## 2016-12-04 NOTE — Consult Note (Signed)
Cardiology Consult    Patient ID: Kevin Burgess MRN: [id number], DOB/AGE: 18-Mar-1940   Admit date: 12/04/2016 Date of Consult: 12/04/2016  Primary Physician: [name], MD Primary Cardiologist: new - C. End, MD  Requesting Provider: S. Patel  Patient Profile    Kevin Burgess is a 76 y.o. male with a history of prostate cancer and probably untreated HTN, who is being seen today for the evaluation of NSTEMI at the request of Dr. Serita Grit.  Past Medical History   Past Medical History:  Diagnosis Date  . OA (osteoarthritis)    a. Pending R Hip THA  . Prostate cancer ([place])    a. s/p TURP ~ 1999.    Past Surgical History:  Procedure Laterality Date  . TRANSURETHRAL RESECTION OF PROSTATE     a. ~ 1999     Allergies  No Known Allergies  History of Present Illness    76 year old male without prior cardiac history. Does have a history of untreated hypertension, arthritis of the right hip, pending replacement, and prostate cancer status post TURP in 1999. Patient does not typically see doctors but in June of this year, was seen by primary care physician because of lower back and right hip pain. He subsequently underwent imaging which showed osteoarthritis of the right hip and he has been followed by orthopedics. He is tentatively scheduled for right total hip arthroplasty on October 9. His activity has been limited in that setting and recently, his wife has noticed that he has been getting short of breath with activity. He was otherwise in his usual state of health until the evening of September 24, with approximate 6 PM, he developed periumbilical pain that was followed by dyspnea, cough, and clear sputum/foaming from his mouth. The symptoms lasted for about 2+ hours prior to them calling their grandson who then drove him to [facility]. There, his chest x-ray showed pulmonary edema while blood pressure was elevated at 170/91. Troponin was mildly elevated at 0.065. He was  treated with sublingual nitroglycerin upon arrival with complete resolution of all symptoms. He was subsequent transferred to [facility] for further evaluation as there were no beds at [facility], [place], or [place]. Here, patient has been pain-free but has remained hypertensive. Follow-up troponin was higher at 0.59. Of note, though he denies prior episodes of chest discomfort, his wife said that last night he was holding his chest some and in the past has reported chest discomfort as well.  Inpatient Medications    . amLODipine  5 mg Oral Daily  . aspirin  81 mg Oral Daily  . [START ON 12/05/2016] aspirin  81 mg Oral Pre-Cath  . atorvastatin  80 mg Oral q1800  . carvedilol  6.25 mg Oral BID WC  . docusate sodium  100 mg Oral BID  . famotidine  20 mg Oral Daily  . nitroGLYCERIN  1 inch Topical Q6H  . potassium chloride  20 mEq Oral Once  . sodium chloride flush  3 mL Intravenous Q12H    Family History    No premature coronary artery disease. Both parents are deceased.  Social History    Social History   Social History  . Marital status: Married    Spouse name: N/A  . Number of children: N/A  . Years of education: N/A   Occupational History  . 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 Smoking status: Never Smoker  .  Smokeless tobacco: Never Used  . Alcohol use No  . Drug use: No  . Sexual activity: Not on file   Other Topics Concern  . Not on file   Social History Narrative   Lives outside of [place] the [place]  Raises chickens.     Review of Systems    General:  No chills, fever, night sweats or weight changes.  Cardiovascular:  +++ chest pain, +++ dyspnea on exertion, no edema, orthopnea, palpitations, paroxysmal nocturnal dyspnea. Dermatological: No rash, lesions/masses Respiratory: +++ cough, +++ dyspnea Urologic: No hematuria, dysuria Abdominal:   Abdominal/periumbilical discomfort last  night. No nausea, vomiting, diarrhea, bright red blood per rectum, melena, or hematemesis Neurologic:  No visual changes, wkns, changes in mental status. All other systems reviewed and are otherwise negative except as noted above.  Physical Exam    Blood pressure (!) 157/75, pulse 81, temperature 98.3 F (36.8 C), temperature source Oral, resp. rate 18, height 5\' 7"  (1.702 m), weight 153 lb 14.4 oz (69.8 kg), SpO2 95 %.  General: Pleasant, NAD Psych: Normal affect. Neuro: Alert and oriented X 3. Moves all extremities spontaneously. HEENT: Normal  Neck: Supple without bruits or JVD. Lungs:  Resp regular and unlabored, CTA. Heart: RRR no s3, s4, or murmurs. Abdomen: protuberant, firm, non-tender, BS + x 4.  Extremities: No clubbing, cyanosis or edema. DP/PT/Radials 2+ and equal bilaterally.  Labs     Recent Labs  12/04/16 0507 12/04/16 1106  TROPONINI 0.59* 0.57*   Lab Results  Component Value Date   WBC 11.7 (H) 12/04/2016   HGB 12.4 (L) 12/04/2016   HCT 36.4 (L) 12/04/2016   MCV 90.5 12/04/2016   PLT 274 12/04/2016     Recent Labs Lab 12/04/16 0507  NA 140  K 3.3*  CL 103  CO2 24  BUN 21*  CREATININE 1.26*  CALCIUM 9.3  PROT 7.8  BILITOT 0.8  ALKPHOS 80  ALT 17  AST 34  GLUCOSE 110*    Radiology Studies    [place] Retroperitoneal Comp  Result Date: 12/04/2016 CLINICAL DATA:  Abdominal pain. EXAM: ULTRASOUND RETROPERITONEAL COMPLETE TECHNIQUE: Ultrasound examination of the abdominal aorta was performed to evaluate for abdominal aortic aneurysm. The common iliac arteries, IVC, and kidneys were also evaluated. COMPARISON:  None. FINDINGS: Abdominal Aorta No aneurysm identified. Maximum AP Diameter:  2.3 cm. Maximum TRV Diameter: 2.0 cm. Right Common Iliac Artery No aneurysm identified. Left Common Iliac Artery No aneurysm identified. IVC No abnormality visualized. Right Kidney Length: 9.3 cm Echogenicity within normal limits. No mass or hydronephrosis visualized. Left  Kidney Length: 9 cm Echogenicity within normal limits. No mass or hydronephrosis visualized. IMPRESSION: No definite abnormality seen in the visualized retroperitoneum. No abdominal aortic aneurysm is noted. Normal kidneys. Electronically Signed   By: Marijo Conception, M.D.   On: 12/04/2016 10:34    ECG & Cardiac Imaging    Sinus tach, 104, LAE, LVH, repol abnormalities  Assessment & Plan    1.  NSTEMI:  Pt presented on tx from [facility] after presenting with periumbilical pain, HTN urgency, dyspnea, and productive cough.  CXR suggestive of pulm infiltrates/edema.  Ss resolved with sl NTG.  Trop mildly elevated initially.  Tx to [facility].  Trop now 0.59.  No recurrent Ss.  BP remains high - 188/86.  In the setting of periumbilical pain, I did obtain an abdominal vascular ultrasound which shows no evidence of abdominal aortic aneurysm. We will therefore plan on diagnostic catheterization today.  The patient understands that risks include but are not limited to stroke (1 in 1000), death (1 in 73), kidney failure [usually temporary] (1 in 500), bleeding (1 in 200), allergic reaction [possibly serious] (1 in 200), and agrees to proceed.  Cont  blocker.  Adding asa, statin, NTP.  Hold off on ACEI/ARB for now with mild renal insuff.  F/u echo.    2.  Abd Pain:  Pt presented w/ abd/peri-umbilical pain in the setting of marked HTN.  Based on ECG, w/ LVH, he likely has a long h/o untreated HTN (does not routinely seek medical care). Abdominal ultrasound negative for aneurysm.  BP mgmt as below.  3.  Hypertensive Urgency:  In setting of above.  Blood pressure now in the 150s with addition of  blocker, ccb, nitrate, and prn hydralazine.  Eventual ARB but holding off for now in setting of mild renal insuff.  4.  Lipids:  F/u lipids.  Adding high potency statin in setting of #1.  5.  Renal Insufficiency:  ? Chronicity.  Creat mildly elevated @ 1.26.  Follow.  6.  OA:  Pending R THA in [place] currently  sched for 10/9.  As above, needs cardiac eval/cath.  Pending results, surgery may need to be delayed.  Signed, Murray Hodgkins, NP 12/04/2016, 1:15 PM  For questions or updates, please contact   Please consult [email or web address] for contact info under Cardiology/STEMI.

## 2016-12-04 NOTE — Progress Notes (Signed)
ANTICOAGULATION CONSULT NOTE - Initial Consult  Pharmacy Consult for heparin Indication: chest pain/ACS  No Known Allergies  Patient Measurements: Height: 5\' 7"  (170.2 cm) Weight: 153 lb (69.4 kg) IBW/kg (Calculated) : 66.1 Heparin Dosing Weight: 69.8 kg  Vital Signs: Temp: 98.5 F (36.9 C) (09/25 1320) Temp Source: Oral (09/25 1320) BP: 139/72 (09/25 1630) Pulse Rate: 70 (09/25 1645)  Labs:  Recent Labs  12/04/16 0507 12/04/16 1106  HGB 12.4*  --   HCT 36.4*  --   PLT 274  --   LABPROT 13.4  --   INR 1.03  --   HEPARINUNFRC 0.38  --   CREATININE 1.26*  --   TROPONINI 0.59* 0.57*    Estimated Creatinine Clearance: 47.4 mL/min (A) (by C-G formula based on SCr of 1.26 mg/dL (H)).   Medical History: Past Medical History:  Diagnosis Date  . History of hiatal hernia   . OA (osteoarthritis)    a. Pending R Hip THA  . Prostate cancer ([place])    a. s/p TURP ~ 1999.    Medications:  Scheduled:  . [MAR Hold] amLODipine  5 mg Oral Daily  . [MAR Hold] aspirin  81 mg Oral Daily  . [START ON 12/05/2016] aspirin  81 mg Oral Pre-Cath  . [MAR Hold] atorvastatin  80 mg Oral q1800  . [MAR Hold] carvedilol  6.25 mg Oral BID WC  . [MAR Hold] docusate sodium  100 mg Oral BID  . [MAR Hold] famotidine  20 mg Oral Daily  . [MAR Hold] nitroGLYCERIN  1 inch Topical Q6H  . [MAR Hold] potassium chloride  20 mEq Oral Once  . sodium chloride flush  3 mL Intravenous Q12H  . sodium chloride flush  3 mL Intravenous Q12H    Assessment: Patient was a direct admit from [facility]. Has CP and elevated trops @ 0.06. Was on heparin drip in route, but was stopped. He is being placed back on heparin here without bolus since he was receiving heparin prior.  Goal of Therapy:  Heparin level 0.3-0.7 units/ml Monitor platelets by anticoagulation protocol: Yes   Plan:  Will not bolus since patient received heparin bolus of 4400 units at outside hospital. Start heparin infusion at 850  units/hr  Baseline labs ordered including aPTT, PT/INR, CBC, HL. Will check follow up heparin level @ 1400 8 hrs after start of drip.  9/25 16:57 cath lab calls to tell us TR band was deflated at 16:45. Dr. Saunders Revel orders heparin to resume 2 hours after TR band removed. Will restart UFH at 850 units/hr at 18:45 and check HL 8 hours after resuming infusion.   Elyon Zoll A. Jordan Hawks, PharmD, BCPS Clinical Pharmacist 12/04/2016

## 2016-12-04 NOTE — Brief Op Note (Signed)
Brief Cardiac Catheterization Note  Date: 12/04/2016 Time: 2:55 PM  PATIENT:  Kevin Burgess  76 y.o. male  PRE-OPERATIVE DIAGNOSIS:  NSTEMI  POST-OPERATIVE DIAGNOSIS:  Same  PROCEDURE:  Procedure(s): LEFT HEART CATH AND CORONARY ANGIOGRAPHY (N/A)  SURGEON:  Surgeon(s) and Role:    Nelva Bush, MD - Primary  FINDINGS: 1. Severe 3-vessel coronary artery disease. 2. Moderately reduced LVEF. 3. Upper normal to mildly elevated LVEDP.  RECOMMENDATIONS: 1. Transfer to [facility] for CABG evaluation. 2. Restart heparin 2 hours after deflation of TR band. 3. Continue BP control. 4. Obtain transthoracic echo.  Nelva Bush, MD [facility] Pager: [phone]

## 2016-12-04 NOTE — H&P (Signed)
Cardiology Consult    Patient ID: Kevin Burgess MRN: [id number], DOB/AGE: 75-Jun-1942   Admit date: 12/04/2016 Date of Consult: 12/04/2016  Primary Physician: [name], MD Primary Cardiologist: new - C. End, MD  Requesting Provider: S. Patel  Patient Profile    Kevin Burgess is a 76 y.o. male with a history of prostate cancer and probably untreated HTN, who is being seen today for the evaluation of NSTEMI at the request of Dr. Serita Grit.  Past Medical History       Past Medical History:  Diagnosis Date  . OA (osteoarthritis)    a. Pending R Hip THA  . Prostate cancer ([place])    a. s/p TURP ~ 1999.         Past Surgical History:  Procedure Laterality Date  . TRANSURETHRAL RESECTION OF PROSTATE     a. ~ 1999     Allergies  No Known Allergies  History of Present Illness    76 year old male without prior cardiac history. Does have a history of untreated hypertension, arthritis of the right hip, pending replacement, and prostate cancer status post TURP in 1999. Patient does not typically see doctors but in June of this year, was seen by primary care physician because of lower back and right hip pain. He subsequently underwent imaging which showed osteoarthritis of the right hip and he has been followed by orthopedics. He is tentatively scheduled for right total hip arthroplasty on October 9. His activity has been limited in that setting and recently, his wife has noticed that he has been getting short of breath with activity. He was otherwise in his usual state of health until the evening of September 24, with approximate 6 PM, he developed periumbilical pain that was followed by dyspnea, cough, and clear sputum/foaming from his mouth. The symptoms lasted for about 2+ hours prior to them calling their grandson who then drove him to [facility]. There, his chest x-ray showed pulmonary edema while blood pressure was elevated at 170/91. Troponin was mildly  elevated at 0.065. He was treated with sublingual nitroglycerin upon arrival with complete resolution of all symptoms. He was subsequent transferred to [facility] for further evaluation as there were no beds at [facility], [place], or [place]. Here, patient has been pain-free but has remained hypertensive. Follow-up troponin was higher at 0.59. Of note, though he denies prior episodes of chest discomfort, his wife said that last night he was holding his chest some and in the past has reported chest discomfort as well.  Inpatient Medications    . amLODipine  5 mg Oral Daily  . aspirin  81 mg Oral Daily  . [START ON 12/05/2016] aspirin  81 mg Oral Pre-Cath  . atorvastatin  80 mg Oral q1800  . carvedilol  6.25 mg Oral BID WC  . docusate sodium  100 mg Oral BID  . famotidine  20 mg Oral Daily  . nitroGLYCERIN  1 inch Topical Q6H  . potassium chloride  20 mEq Oral Once  . sodium chloride flush  3 mL Intravenous Q12H    Family History    No premature coronary artery disease. Both parents are deceased.  Social History    Social History        Social History  . Marital status: Married    Spouse name: N/A  . Number of children: N/A  . Years of education: N/A       Occupational History  . Advertising account planner   . worked in  furniture industry for many years        Social History Main Topics  . Smoking status: Never Smoker  . Smokeless tobacco: Never Used  . Alcohol use No  . Drug use: No  . Sexual activity: Not on file       Other Topics Concern  . Not on file      Social History Narrative   [place] of [place]  Raises chickens.     Review of Systems    General:  No chills, fever, night sweats or weight changes.  Cardiovascular:  +++ chest pain, +++ dyspnea on exertion, no edema, orthopnea, palpitations, paroxysmal nocturnal dyspnea. Dermatological: No rash, lesions/masses Respiratory: +++ cough, +++ dyspnea Urologic: No hematuria,  dysuria Abdominal:   Abdominal/periumbilical discomfort last night. No nausea, vomiting, diarrhea, bright red blood per rectum, melena, or hematemesis Neurologic:  No visual changes, wkns, changes in mental status. All other systems reviewed and are otherwise negative except as noted above.  Physical Exam    Blood pressure (!) 157/75, pulse 81, temperature 98.3 F (36.8 C), temperature source Oral, resp. rate 18, height 5\' 7"  (1.702 m), weight 153 lb 14.4 oz (69.8 kg), SpO2 95 %.  General: Pleasant, NAD Psych: Normal affect. Neuro: Alert and oriented X 3. Moves all extremities spontaneously. HEENT: Normal  Neck: Supple without bruits or JVD. Lungs:  Resp regular and unlabored, CTA. Heart: RRR no s3, s4, or murmurs. Abdomen: protuberant, firm, non-tender, BS + x 4.  Extremities: No clubbing, cyanosis or edema. DP/PT/Radials 2+ and equal bilaterally.  Labs     Recent Labs (last 2 labs)    Recent Labs  12/04/16 0507 12/04/16 1106  TROPONINI 0.59* 0.57*     Recent Labs       Lab Results  Component Value Date   WBC 11.7 (H) 12/04/2016   HGB 12.4 (L) 12/04/2016   HCT 36.4 (L) 12/04/2016   MCV 90.5 12/04/2016   PLT 274 12/04/2016       Last Labs    Recent Labs Lab 12/04/16 0507  NA 140  K 3.3*  CL 103  CO2 24  BUN 21*  CREATININE 1.26*  CALCIUM 9.3  PROT 7.8  BILITOT 0.8  ALKPHOS 80  ALT 17  AST 34  GLUCOSE 110*      Radiology Studies     Imaging Results  [place] Retroperitoneal Comp  Result Date: 12/04/2016 CLINICAL DATA:  Abdominal pain. EXAM: ULTRASOUND RETROPERITONEAL COMPLETE TECHNIQUE: Ultrasound examination of the abdominal aorta was performed to evaluate for abdominal aortic aneurysm. The common iliac arteries, IVC, and kidneys were also evaluated. COMPARISON:  None. FINDINGS: Abdominal Aorta No aneurysm identified. Maximum AP Diameter:  2.3 cm. Maximum TRV Diameter: 2.0 cm. Right Common Iliac Artery No aneurysm identified. Left Common  Iliac Artery No aneurysm identified. IVC No abnormality visualized. Right Kidney Length: 9.3 cm Echogenicity within normal limits. No mass or hydronephrosis visualized. Left Kidney Length: 9 cm Echogenicity within normal limits. No mass or hydronephrosis visualized. IMPRESSION: No definite abnormality seen in the visualized retroperitoneum. No abdominal aortic aneurysm is noted. Normal kidneys. Electronically Signed   By: Marijo Conception, M.D.   On: 12/04/2016 10:34     ECG & Cardiac Imaging    Sinus tach, 104, LAE, LVH, repol abnormalities  Assessment & Plan    1.  NSTEMI:  Pt presented on tx from [facility] after presenting with periumbilical pain, HTN urgency, dyspnea, and productive cough.  CXR  suggestive of pulm infiltrates/edema.  Ss resolved with sl NTG.  Trop mildly elevated initially.  Tx to [facility].  Trop now 0.59.  No recurrent Ss.  BP remains high - 188/86.  In the setting of periumbilical pain, I did obtain an abdominal vascular ultrasound which shows no evidence of abdominal aortic aneurysm. Cath today has revealed severe 3VD.  Plan to tx to [facility] for CT surgical eval.  Cont ? blocker.  Adding asa, statin, NTP.  Hold off on ACEI/ARB for now with mild renal insuff.  F/u echo.    2.  Abd Pain:  Pt presented w/ abd/peri-umbilical pain in the setting of marked HTN.  Based on ECG, w/ LVH, he likely has a long h/o untreated HTN (does not routinely seek medical care). Abdominal ultrasound negative for aneurysm.  BP mgmt as below.  3.  Hypertensive Urgency:  In setting of above.  Blood pressure now in the 150s with addition of ? blocker, ccb, nitrate, and prn hydralazine.  Eventual ARB but holding off for now in setting of mild renal insuff.  4.  Lipids:  F/u lipids.  Adding high potency statin in setting of #1.  5.  Renal Insufficiency:  ? Chronicity.  Creat mildly elevated @ 1.26.  Follow.  6.  OA:  Pending R THA in [place] currently sched for 10/9.  This will need to be delayed in  the setting of above.  Signed, Murray Hodgkins, NP 12/04/2016, 4:40 PM  For questions or updates, please contact   Please consult [email or web address] contactinfo under Cardiology/STEMI.

## 2016-12-04 NOTE — Interval H&P Note (Signed)
History and Physical Interval Note:  12/04/2016 2:06 PM  Kevin Burgess  has presented today for cardiac catheterization, with the diagnosis of NSTEMI. The various methods of treatment have been discussed with the patient and family. After consideration of risks, benefits and other options for treatment, the patient has consented to  Procedure(s): LEFT HEART CATH AND CORONARY ANGIOGRAPHY (N/A) as a surgical intervention .  The patient's history has been reviewed, patient examined, no change in status, stable for surgery.  I have reviewed the patient's chart and labs.  Questions were answered to the patient's satisfaction.    Cath Lab Visit (complete for each Cath Lab visit)  Clinical Evaluation Leading to the Procedure:   ACS: Yes.    Non-ACS: N/A  Stavros Cail

## 2016-12-04 NOTE — Progress Notes (Signed)
CH made a follow up visit with pt. Pt was in the procedure and have not returned to the [facility]. [place] to follow up pt as needed.   12/04/16 1600  Clinical Encounter Type  Visited With Patient  Visit Type Follow-up  Referral From [place];Other (Comment)

## 2016-12-04 NOTE — Discharge Summary (Signed)
[facility] [place] at [facility], 76 y.o., DOB [birth year], MRN [id number]. Admission date: 12/04/2016 Discharge Date 12/04/2016 Primary MD [name], MD Admitting Physician Lance Coon, MD   TRANSFER Summary   Admission Diagnosis  CHF SHORTNESS OF BREATH HYPERKALEMIA nstemi nstemi  Discharge Diagnosis   Active Problems:   NSTEMI (non-ST elevated myocardial infarction) [facility]) Severe 3 vessel disease Acute chf type unknown Essential htn gerd H/o prostate cancer       Hospital Course  the patient with past medical history of prostate cancer presents to [facility] from [facility] for higher level of care. The patient developed chest pain at rest earlier this evening. Troponin at the other facility was 0.065 and EKG showed ST depressions. Pt was accepted in transfer, was seen by [facility]ilogy and underwent CATH which showed severe 3 vessel disease. Pt was arranged tranfer by cardiology to [facility] for CABG.            Consults cards Significant Tests:  See full reports for all details    [place] Retroperitoneal Comp  Result Date: 12/04/2016 CLINICAL DATA:  Abdominal pain. EXAM: ULTRASOUND RETROPERITONEAL COMPLETE TECHNIQUE: Ultrasound examination of the abdominal aorta was performed to evaluate for abdominal aortic aneurysm. The common iliac arteries, IVC, and kidneys were also evaluated. COMPARISON:  None. FINDINGS: Abdominal Aorta No aneurysm identified. Maximum AP Diameter:  2.3 cm. Maximum TRV Diameter: 2.0 cm. Right Common Iliac Artery No aneurysm identified. Left Common Iliac Artery No aneurysm identified. IVC No abnormality visualized. Right Kidney Length: 9.3 cm Echogenicity within normal limits. No mass or hydronephrosis visualized. Left Kidney Length: 9 cm Echogenicity within normal limits. No mass or hydronephrosis visualized. IMPRESSION: No definite abnormality seen in the visualized retroperitoneum. No abdominal aortic  aneurysm is noted. Normal kidneys. Electronically Signed   By: Marijo Conception, M.D.   On: 12/04/2016 10:34       Today   Subjective:   Kevin Burgess no further chest pain  Objective:   Blood pressure 139/72, pulse 70, temperature 98.5 F (36.9 C), temperature source Oral, resp. rate 19, height 5\' 7"  (1.702 m), weight 153 lb (69.4 kg), SpO2 92 %.  .  Intake/Output Summary (Last 24 hours) at 12/04/16 1925 Last data filed at 12/04/16 0609  Gross per 24 hour  Intake            24.46 ml  Output              200 ml  Net          -175.54 ml    Exam VITAL SIGNS: Blood pressure 139/72, pulse 70, temperature 98.5 F (36.9 C), temperature source Oral, resp. rate 19, height 5\' 7"  (1.702 m), weight 153 lb (69.4 kg), SpO2 92 %.  GENERAL:  76 y.o.-year-old patient lying in the bed with no acute distress.  EYES: Pupils equal, round, reactive to light and accommodation. No scleral icterus. Extraocular muscles intact.  HEENT: Head atraumatic, normocephalic. Oropharynx and nasopharynx clear.  NECK:  Supple, no jugular venous distention. No thyroid enlargement, no tenderness.  LUNGS: Normal breath sounds bilaterally, no wheezing, rales,rhonchi or crepitation. No use of accessory muscles of respiration.  CARDIOVASCULAR: S1, S2 normal. No murmurs, rubs, or gallops.  ABDOMEN: Soft, nontender, nondistended. Bowel sounds present. No organomegaly or mass.  EXTREMITIES: No pedal edema, cyanosis, or clubbing.  NEUROLOGIC: Cranial nerves II through XII are intact. Muscle strength 5/5 in all extremities. Sensation intact. Gait not checked.  PSYCHIATRIC:  The patient is alert and oriented x 3.  SKIN: No obvious rash, lesion, or ulcer.   Data Review     CBC w Diff: Lab Results  Component Value Date   WBC 11.7 (H) 12/04/2016   HGB 12.4 (L) 12/04/2016   HCT 36.4 (L) 12/04/2016   PLT 274 12/04/2016   CMP: Lab Results  Component Value Date   NA 140 12/04/2016   K 3.3 (L) 12/04/2016   CL 103  12/04/2016   CO2 24 12/04/2016   BUN 21 (H) 12/04/2016   CREATININE 1.26 (H) 12/04/2016   PROT 7.8 12/04/2016   ALBUMIN 4.2 12/04/2016   BILITOT 0.8 12/04/2016   ALKPHOS 80 12/04/2016   AST 34 12/04/2016   ALT 17 12/04/2016  .  Micro Results No results found for this or any previous visit (from the past 240 hour(s)).      Code Status Orders        Start     Ordered   12/04/16 0504  Full code  Continuous     12/04/16 0506    Code Status History    Date Active Date Inactive Code Status Order ID Comments User Context   This patient has a current code status but no historical code status.    Advance Directive Documentation     Most Recent Value  Type of Advance Directive  Healthcare Power of Attorney  Pre-existing out of facility DNR order (yellow form or pink MOST form)  -  "MOST" Form in Place?  -            Discharge Medications   Allergies as of 12/04/2016   No Known Allergies     Medication List    TAKE these medications   aspirin 81 MG chewable tablet Chew 1 tablet (81 mg total) by mouth daily.   atorvastatin 80 MG tablet Commonly known as:  LIPITOR Take 1 tablet (80 mg total) by mouth daily at 6 PM.   carvedilol 6.25 MG tablet Commonly known as:  COREG Take 1 tablet (6.25 mg total) by mouth 2 (two) times daily with a meal.   heparin 100-0.45 UNIT/ML-% infusion Inject 850 Units/hr into the vein continuous.   nitroGLYCERIN 2 % ointment Commonly known as:  NITROGLYN Apply 1 inch topically every 6 (six) hours.            Discharge Care Instructions        Start     Ordered   12/05/16 0000  aspirin 81 MG chewable tablet  Daily     12/04/16 1925   12/05/16 0000  nitroGLYCERIN (NITROGLYN) 2 % ointment  Every 6 hours     12/04/16 1925   12/05/16 0000  atorvastatin (LIPITOR) 80 MG tablet  Daily-1800     12/04/16 1925   12/04/16 0000  heparin 100-0.45 UNIT/ML-% infusion  Continuous     12/04/16 1925   12/04/16 0000  carvedilol (COREG)  6.25 MG tablet  2 times daily with meals     12/04/16 1925         Total Time in preparing paper work, data evaluation and todays exam - 35 minutes  Dustin Flock M.D on 12/04/2016 at 7:25 PM  [place]  [phone]

## 2016-12-04 NOTE — Progress Notes (Signed)
ANTICOAGULATION CONSULT NOTE - Initial Consult  Pharmacy Consult for heparin Indication: chest pain/ACS  No Known Allergies  Patient Measurements: Weight: 153 lb 14.4 oz (69.8 kg) Heparin Dosing Weight: 69.8 kg  Vital Signs: Temp: 98.1 F (36.7 C) (09/25 0402) Temp Source: Oral (09/25 0402) BP: 188/86 (09/25 0402) Pulse Rate: 93 (09/25 0402)  Labs:  Recent Labs  12/04/16 0507  LABPROT 13.4  INR 1.03    CrCl cannot be calculated (No order found.).   Medical History: No past medical history on file.  Medications:  Scheduled:  . docusate sodium  100 mg Oral BID  . heparin  4,000 Units Intravenous Once    Assessment: Patient was a direct admit from [facility]. Has CP and elevated trops @ 0.06. Was on heparin drip in route, but was stopped. He is being placed back on heparin here without bolus since he was receiving heparin prior.  Goal of Therapy:  Heparin level 0.3-0.7 units/ml Monitor platelets by anticoagulation protocol: Yes   Plan:  Will not bolus since patient received heparin bolus of 4400 units at outside hospital. Start heparin infusion at 850 units/hr  Baseline labs ordered including aPTT, PT/INR, CBC, HL. Will check follow up heparin level @ 1400 8 hrs after start of drip.  Tobie Lords, PharmD, BCPS Clinical Pharmacist 12/04/2016

## 2016-12-04 NOTE — Progress Notes (Signed)
[place] [facility]                                                                                                                                                                                  Patient Demographics   Kevin Burgess, is a 76 y.o. 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 No fatigue, weakness. No weight gain, no weight loss.  EYES: No blurry or double vision.  ENT: No tinnitus. No postnasal drip. No redness of the oropharynx.  RESPIRATORY: No cough, no wheeze, no hemoptysis. No dyspnea.  CARDIOVASCULAR: No chest pain. No orthopnea. No palpitations. No syncope.  GASTROINTESTINAL: No nausea, no vomiting or diarrhea. No abdominal pain. No melena or hematochezia.  GENITOURINARY: No dysuria or hematuria.  ENDOCRINE: No polyuria or nocturia. No heat or cold intolerance.  HEMATOLOGY: No anemia. No bruising. No bleeding.  INTEGUMENTARY: No rashes. No lesions.  MUSCULOSKELETAL: No arthritis. No swelling. No gout.  NEUROLOGIC: No numbness, tingling, or ataxia. No seizure-type activity.  PSYCHIATRIC: No anxiety. No insomnia. No ADD.    Vitals:   Vitals:   12/04/16 1401 12/04/16 1544 12/04/16 1600 12/04/16 1601  BP:  136/70  (!) 141/70  Pulse:  73 76 76  Resp:  17 20 20   Temp:      TempSrc:      SpO2: 99% 94% 93% 91%  Weight:      Height:        Wt Readings from Last 3 Encounters:  12/04/16 153 lb (69.4 kg)     Intake/Output Summary (Last 24 hours) at 12/04/16 1609 Last data filed at 12/04/16 0609  Gross per 24 hour  Intake            24.46 ml  Output              200 ml  Net           -175.54 ml    Physical Exam:   GENERAL: Pleasant-appearing in no apparent distress.  HEAD, EYES, EARS, NOSE AND THROAT: Atraumatic, normocephalic. Extraocular muscles are intact. Pupils equal and reactive to light. Sclerae anicteric. No conjunctival injection. No oro-pharyngeal erythema.  NECK: Supple. There is no jugular venous distention. No bruits, no lymphadenopathy, no thyromegaly.  HEART: Regular rate and rhythm,. No murmurs, no rubs, no clicks.  LUNGS: Clear to auscultation bilaterally. No rales or rhonchi. No wheezes.  ABDOMEN: Soft, flat, nontender, nondistended. Has good bowel sounds. No hepatosplenomegaly appreciated.  EXTREMITIES: No evidence of any cyanosis, clubbing, or peripheral edema.  +2 pedal and radial pulses bilaterally.  NEUROLOGIC: The patient is alert, awake, and oriented x3 with no focal motor or sensory deficits appreciated bilaterally.  SKIN: Moist and warm with no rashes appreciated.  Psych: Not anxious, depressed LN: No inguinal LN enlargement    Antibiotics   Anti-infectives    None      Medications   Scheduled Meds: . [MAR Hold] amLODipine  5 mg Oral Daily  . [MAR Hold] aspirin  81 mg Oral Daily  . [START ON 12/05/2016] aspirin  81 mg Oral Pre-Cath  . [MAR Hold] atorvastatin  80 mg Oral q1800  . [MAR Hold] carvedilol  6.25 mg Oral BID WC  . [MAR Hold] docusate sodium  100 mg Oral BID  . [MAR Hold] famotidine  20 mg Oral Daily  . [MAR Hold] nitroGLYCERIN  1 inch Topical Q6H  . [MAR Hold] potassium chloride  20 mEq Oral Once  . sodium chloride flush  3 mL Intravenous Q12H  . sodium chloride flush  3 mL Intravenous Q12H   Continuous Infusions: . sodium chloride 100 mL/hr at 12/04/16 0555  . sodium chloride    . sodium chloride    . sodium chloride    . [START ON 12/05/2016] sodium chloride     Followed by  . [START ON 12/05/2016] sodium chloride    . heparin 850 Units/hr (12/04/16 0601)   PRN Meds:.sodium chloride, sodium chloride, [MAR Hold]  acetaminophen **OR** [MAR Hold] acetaminophen, [MAR Hold] hydrALAZINE, [MAR Hold] ondansetron **OR** [MAR Hold] ondansetron (ZOFRAN) IV, sodium chloride flush, sodium chloride flush   Data Review:   Micro Results No results found for this or any previous visit (from the past 240 hour(s)).  Radiology Reports [place] Retroperitoneal Comp  Result Date: 12/04/2016 CLINICAL DATA:  Abdominal pain. EXAM: ULTRASOUND RETROPERITONEAL COMPLETE TECHNIQUE: Ultrasound examination of the abdominal aorta was performed to evaluate for abdominal aortic aneurysm. The common iliac arteries, IVC, and kidneys were also evaluated. COMPARISON:  None. FINDINGS: Abdominal Aorta No aneurysm identified. Maximum AP Diameter:  2.3 cm. Maximum TRV Diameter: 2.0 cm. Right Common Iliac Artery No aneurysm identified. Left Common Iliac Artery No aneurysm identified. IVC No abnormality visualized. Right Kidney Length: 9.3 cm Echogenicity within normal limits. No mass or hydronephrosis visualized. Left Kidney Length: 9 cm Echogenicity within normal limits. No mass or hydronephrosis visualized. IMPRESSION: No definite abnormality seen in the visualized retroperitoneum. No abdominal aortic aneurysm is noted. Normal kidneys. Electronically Signed   By: Marijo Conception, M.D.   On: 12/04/2016 10:34     CBC  Recent Labs Lab 12/04/16 0507  WBC 11.7*  HGB 12.4*  HCT 36.4*  PLT 274  MCV 90.5  MCH 30.7  MCHC 34.0  RDW 14.3    Chemistries   Recent Labs Lab 12/04/16 0507  NA 140  K 3.3*  CL 103  CO2 24  GLUCOSE 110*  BUN 21*  CREATININE 1.26*  CALCIUM 9.3  AST 34  ALT 17  ALKPHOS 80  BILITOT 0.8   ------------------------------------------------------------------------------------------------------------------ estimated creatinine clearance is 47.4 mL/min (A) (by C-G formula based on SCr of 1.26 mg/dL  (H)). ------------------------------------------------------------------------------------------------------------------  Recent Labs  12/04/16 0507  HGBA1C 5.7*   ------------------------------------------------------------------------------------------------------------------ No results for input(s): CHOL, HDL, LDLCALC, TRIG, CHOLHDL, LDLDIRECT in the last 72  hours. ------------------------------------------------------------------------------------------------------------------  Recent Labs  12/04/16 0507  TSH 1.969   ------------------------------------------------------------------------------------------------------------------ No results for input(s): VITAMINB12, FOLATE, FERRITIN, TIBC, IRON, RETICCTPCT in the last 72 hours.  Coagulation profile  Recent Labs Lab 12/04/16 0507  INR 1.03    No results for input(s): DDIMER in the last 72 hours.  Cardiac Enzymes  Recent Labs Lab 12/04/16 0507 12/04/16 1106  TROPONINI 0.59* 0.57*   ------------------------------------------------------------------------------------------------------------------ Invalid input(s): POCBNP    Assessment & Plan   1. NSTEMI: anterior lateral ST abnormalities with elevated troponin. Continued therapy with aspirin, lipitor,  Status post cardiac catheter with three-vessel disease cardiology to arrange transfer 2. CHF: acute; systolic. 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 Pulmonary edema improved following Lasix.echo pending 3. Hypertension: continue Coreg 4. GERD: Pepcid 5. DVT prophylaxis: as above 6 GI prophylaxis: as above      Code Status Orders        Start     Ordered   12/04/16 0504  Full code  Continuous     12/04/16 0506    Code Status History    Date Active Date Inactive Code Status Order ID Comments User Context   This patient has a current code status but no historical code status.    Advance Directive Documentation     Most Recent Value  Type of Advance Directive   Healthcare Power of Attorney  Pre-existing out of facility DNR order (yellow form or pink MOST form)  -  "MOST" Form in Place?  -           Consults  cardiology DVT Prophylaxis  Heparin,  Lab Results  Component Value Date   PLT 274 12/04/2016     Time Spent in minutes   55min Greater than 50% of time spent in care coordination and counseling patient regarding the condition and plan of care.   Dustin Flock M.D on 12/04/2016 at 4:09 PM  Between 7am to 6pm - Pager - [phone]  After 6pm go to [email or web address] - password [email or web address]   Office  [phone]

## 2016-12-05 ENCOUNTER — Other Ambulatory Visit (HOSPITAL_COMMUNITY): Payer: Medicare Other

## 2016-12-05 ENCOUNTER — Other Ambulatory Visit: Payer: Self-pay | Admitting: *Deleted

## 2016-12-05 ENCOUNTER — Encounter (HOSPITAL_COMMUNITY): Payer: Self-pay

## 2016-12-05 DIAGNOSIS — I214 Non-ST elevation (NSTEMI) myocardial infarction: Secondary | ICD-10-CM

## 2016-12-05 DIAGNOSIS — I10 Essential (primary) hypertension: Secondary | ICD-10-CM

## 2016-12-05 DIAGNOSIS — I2510 Atherosclerotic heart disease of native coronary artery without angina pectoris: Secondary | ICD-10-CM

## 2016-12-05 DIAGNOSIS — I25110 Atherosclerotic heart disease of native coronary artery with unstable angina pectoris: Secondary | ICD-10-CM

## 2016-12-05 DIAGNOSIS — E7800 Pure hypercholesterolemia, unspecified: Secondary | ICD-10-CM

## 2016-12-05 LAB — SURGICAL PCR SCREEN
MRSA, PCR: NEGATIVE
STAPHYLOCOCCUS AUREUS: POSITIVE — AB

## 2016-12-05 LAB — URINALYSIS, COMPLETE (UACMP) WITH MICROSCOPIC
Bacteria, UA: NONE SEEN
Bilirubin Urine: NEGATIVE
GLUCOSE, UA: NEGATIVE mg/dL
HGB URINE DIPSTICK: NEGATIVE
Ketones, ur: NEGATIVE mg/dL
Leukocytes, UA: NEGATIVE
NITRITE: NEGATIVE
PH: 6 (ref 5.0–8.0)
Protein, ur: NEGATIVE mg/dL
RBC / HPF: NONE SEEN RBC/hpf (ref 0–5)
SPECIFIC GRAVITY, URINE: 1.018 (ref 1.005–1.030)
Squamous Epithelial / LPF: NONE SEEN

## 2016-12-05 LAB — BASIC METABOLIC PANEL
ANION GAP: 7 (ref 5–15)
BUN: 15 mg/dL (ref 6–20)
CALCIUM: 8.8 mg/dL — AB (ref 8.9–10.3)
CHLORIDE: 107 mmol/L (ref 101–111)
CO2: 26 mmol/L (ref 22–32)
Creatinine, Ser: 1.16 mg/dL (ref 0.61–1.24)
GFR calc non Af Amer: 60 mL/min — ABNORMAL LOW (ref >60)
Glucose, Bld: 98 mg/dL (ref 65–99)
POTASSIUM: 3.5 mmol/L (ref 3.5–5.1)
Sodium: 140 mmol/L (ref 135–145)

## 2016-12-05 LAB — CBC
HEMATOCRIT: 32.2 % — AB (ref 39.0–52.0)
HEMOGLOBIN: 10.7 g/dL — AB (ref 13.0–17.0)
MCH: 30.2 pg (ref 26.0–34.0)
MCHC: 33.2 g/dL (ref 30.0–36.0)
MCV: 91 fL (ref 78.0–100.0)
Platelets: 262 10*3/uL (ref 150–400)
RBC: 3.54 MIL/uL — AB (ref 4.22–5.81)
RDW: 14 % (ref 11.5–15.5)
WBC: 8.3 10*3/uL (ref 4.0–10.5)

## 2016-12-05 LAB — LIPID PANEL
CHOLESTEROL: 256 mg/dL — AB (ref 0–200)
HDL: 47 mg/dL (ref >40)
LDL Cholesterol: 188 mg/dL — ABNORMAL HIGH (ref 0–99)
Total CHOL/HDL Ratio: 5.4 RATIO
Triglycerides: 106 mg/dL (ref <150)
VLDL: 21 mg/dL (ref 0–40)

## 2016-12-05 LAB — HEPARIN LEVEL (UNFRACTIONATED): HEPARIN UNFRACTIONATED: 0.37 [IU]/mL (ref 0.30–0.70)

## 2016-12-05 SURGERY — LEFT HEART CATH AND CORONARY ANGIOGRAPHY
Anesthesia: Moderate Sedation

## 2016-12-05 MED ORDER — MUPIROCIN 2 % EX OINT
1.0000 "application " | TOPICAL_OINTMENT | Freq: Two times a day (BID) | CUTANEOUS | Status: DC
  Administered 2016-12-05 – 2016-12-06 (×4): 1 via NASAL
  Filled 2016-12-05: qty 22

## 2016-12-05 MED ORDER — INFLUENZA VAC SPLIT HIGH-DOSE 0.5 ML IM SUSY
0.5000 mL | PREFILLED_SYRINGE | INTRAMUSCULAR | Status: DC
  Filled 2016-12-05: qty 0.5

## 2016-12-05 MED ORDER — CHLORHEXIDINE GLUCONATE CLOTH 2 % EX PADS
6.0000 | MEDICATED_PAD | Freq: Every day | CUTANEOUS | Status: DC
  Administered 2016-12-05 – 2016-12-06 (×2): 6 via TOPICAL

## 2016-12-05 MED ORDER — PNEUMOCOCCAL VAC POLYVALENT 25 MCG/0.5ML IJ INJ: 0.5000 mL | INJECTION | INTRAMUSCULAR | Status: DC

## 2016-12-05 NOTE — Consult Note (Addendum)
[address]       [place] [address]             [phone]          CARDIOTHORACIC SURGERY CONSULTATION REPORT  PCP is [name], MD Referring Provider is End, Harrell Gave, MD   Reason for consultation:  3 vessel CAD s/p acute non-STEMI  HPI:  Patient is a 76 year old male with no previous history of coronary artery disease and risk factors notable primarily for that of hyperlipidemia who was admitted to [facility] 12/03/2016 with acute systolic congestive heart failure and non-ST segment elevation myocardial infarction and was transferred to [facility] for surgical consultation to discuss treatment options for management of severe three-vessel coronary artery disease. The patient has been fairly healthy for most of his life and physically active. He notes that over the last several weeks or more he has developed exertional shortness of breath and occasional episodes of epigastric chest pain which typically were postprandial and attributed to possible dyspepsia. On 12/03/2016 he developed severe epigastric chest pain associated with shortness of breath after eating supper.  Symptoms persisted for more than 2 hours prompting the patient to be seen emergently at [facility] where the patient was notably hypertensive and chest x-ray revealed pulmonary edema.  Troponin levels were mildly elevated at 0.065. He was treated with sublingual nitroglycerin and symptoms probably resolved. He was transferred to [facility] and follow-up troponin levels rose further to 0.59. The patient subsequently underwent diagnostic cardiac catheterization by Dr. Saunders Revel and was found to have severe three-vessel coronary artery disease with preserved left ventricular systolic function. The patient was transferred to [facility] for further management.  He has not had any recurrent episodes of epigastric chest  pain nor resting shortness of breath since admission.  Patient is married and lives with his wife near [place], [place]. 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 He has been physically active and functionally independent all of his life and has been working up until immediately prior to admission. He has been having problems with pain in his right hip for the past year or more, and he was recently evaluated for possible hip replacement. He admits to exertional shortness of breath over the last several weeks or more. He has been slowing down a fair amount. He denies any resting shortness of breath, PND, orthopnea, or lower extremity edema. He has had some dizzy spells without syncope. Despite the pain in his hip he is still ambulatory and he does not require a cane or any other assistance for mobility.  Past Medical History:  Diagnosis Date  . History of hiatal hernia   . OA (osteoarthritis)    a. Pending R Hip THA  . Prostate cancer ([place])    a. s/p TURP ~ 1999.    Past Surgical History:  Procedure Laterality Date  . LEFT HEART CATH AND CORONARY ANGIOGRAPHY N/A 12/04/2016   Procedure: LEFT HEART CATH AND CORONARY ANGIOGRAPHY;  Surgeon: Nelva Bush, MD;  Location: [facility];  Service: Cardiovascular;  Laterality: N/A;  . TRANSURETHRAL RESECTION OF PROSTATE     a. ~ 1999    History reviewed. No pertinent family history.  Social History   Social History  . Marital status: Married    Spouse name: N/A  . Number of children: N/A  . Years of education: N/A   Occupational History  .  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 Smoking status: Never Smoker  . Smokeless tobacco: Never Used  . Alcohol use No  . Drug use: No  . Sexual activity: Not on file   Other Topics Concern  . Not on file   Social History Narrative   [place] of [place]  Raises chickens.    Prior to  Admission medications   Medication Sig Start Date End Date Taking? Authorizing Provider  alendronate (FOSAMAX) 70 MG tablet Take 70 mg by mouth every 7 (seven) days. 09/27/16  Yes [provider]  aspirin 81 MG chewable tablet Chew 1 tablet (81 mg total) by mouth daily. 12/05/16  Yes Dustin Flock, MD  bicalutamide (CASODEX) 50 MG tablet Take 50 mg by mouth daily. 10/29/16  Yes [provider]  escitalopram (LEXAPRO) 10 MG tablet Take 10 mg by mouth daily. 08/30/16  Yes [provider]  ibuprofen (ADVIL,MOTRIN) 800 MG tablet Take 800 mg by mouth 3 (three) times daily as needed for pain. 12/01/16  Yes [provider]  omeprazole (PRILOSEC) 20 MG capsule Take 20 mg by mouth daily. 11/22/16  Yes [provider]  atorvastatin (LIPITOR) 80 MG tablet Take 1 tablet (80 mg total) by mouth daily at 6 PM. 12/05/16   Dustin Flock, MD  carvedilol (COREG) 6.25 MG tablet Take 1 tablet (6.25 mg total) by mouth 2 (two) times daily with a meal. 12/04/16   Dustin Flock, MD  heparin 100-0.45 UNIT/ML-% infusion Inject 850 Units/hr into the vein continuous. 12/04/16   Dustin Flock, MD  nitroGLYCERIN (NITROGLYN) 2 % ointment Apply 1 inch topically every 6 (six) hours. 12/05/16   Dustin Flock, MD    Current Facility-Administered Medications  Medication Dose Route Frequency Provider Last Rate Last Dose  . 0.9 %  sodium chloride infusion  250 mL Intravenous PRN Rogelia Mire, NP 10 mL/hr at 12/05/16 0615 250 mL at 12/05/16 0615  . acetaminophen (TYLENOL) tablet 650 mg  650 mg Oral Q4H PRN Rogelia Mire, NP      . amLODipine (NORVASC) tablet 5 mg  5 mg Oral Daily Rogelia Mire, NP   5 mg at 12/05/16 0948  . aspirin EC tablet 81 mg  81 mg Oral Daily Rogelia Mire, NP   81 mg at 12/05/16 0948  . atorvastatin (LIPITOR) tablet 80 mg  80 mg Oral q1800 Rogelia Mire, NP   80 mg at 12/05/16 1708  . carvedilol (COREG) tablet 6.25 mg  6.25 mg Oral  BID WC Murray Hodgkins R, NP   6.25 mg at 12/05/16 1708  . Chlorhexidine Gluconate Cloth 2 % PADS 6 each  6 each Topical Daily End, Christopher, MD   6 each at 12/05/16 1007  . heparin ADULT infusion 100 units/mL (25000 units/22mL sodium chloride 0.45%)  850 Units/hr Intravenous Continuous Carney, Jessica C, RPH 8.5 mL/hr at 12/05/16 1007 850 Units/hr at 12/05/16 1007  . hydrALAZINE (APRESOLINE) injection 10 mg  10 mg Intravenous Q6H PRN Rogelia Mire, NP      . Derrill Memo ON 12/06/2016] Influenza vac split quadrivalent PF (FLUZONE HIGH-DOSE) injection 0.5 mL  0.5 mL Intramuscular Tomorrow-1000 End, Christopher, MD      . mupirocin ointment (BACTROBAN) 2 % 1 application  1 application Nasal BID End, Harrell Gave, MD   1 application at 32/20/25 0948  . nitroGLYCERIN (NITROGLYN) 2 % ointment 1  inch  1 inch Topical Q6H Rogelia Mire, NP   1 inch at 12/05/16 1709  . nitroGLYCERIN (NITROSTAT) SL tablet 0.4 mg  0.4 mg Sublingual Q5 Min x 3 PRN Rogelia Mire, NP      . ondansetron [facility]) injection 4 mg  4 mg Intravenous Q6H PRN Rogelia Mire, NP      . Derrill Memo ON 12/06/2016] pneumococcal 23 valent vaccine (PNU-IMMUNE) injection 0.5 mL  0.5 mL Intramuscular Tomorrow-1000 End, Christopher, MD      . sodium chloride flush (NS) 0.9 % injection 3 mL  3 mL Intravenous Q12H Murray Hodgkins R, NP      . sodium chloride flush (NS) 0.9 % injection 3 mL  3 mL Intravenous PRN Rogelia Mire, NP        No Known Allergies    Review of Systems:   General:  normal appetite, decreased energy, no weight gain, no weight loss, no fever  Cardiac:  + chest pain with exertion, + chest pain at rest, +SOB with exertion, no resting SOB, no PND, no orthopnea, no palpitations, no arrhythmia, no atrial fibrillation, no LE edema, + dizzy spells, no syncope  Respiratory:  + shortness of breath, no home oxygen, + productive cough, no dry cough, no bronchitis, no wheezing, no hemoptysis, no asthma,  no pain with inspiration or cough, no sleep apnea, no CPAP at night  GI:   no difficulty swallowing, no reflux, no frequent heartburn, no hiatal hernia, + abdominal pain, no constipation, no diarrhea, no hematochezia, no hematemesis, no melena  GU:   no dysuria,  no frequency, no urinary tract infection, no hematuria, no enlarged prostate, no kidney stones, no kidney disease  Vascular:  no pain suggestive of claudication, no pain in feet, no leg cramps, no varicose veins, no DVT, no non-healing foot ulcer  Neuro:   no stroke, + TIA's, no seizures, no headaches, + temporary blindness one eye,  no slurred speech, no peripheral neuropathy, no chronic pain, no instability of gait, no memory/cognitive dysfunction  Musculoskeletal: + arthritis - primarily involving the right hip, no joint swelling, no myalgias, + difficulty walking, normal mobility   Skin:   no rash, no itching, no skin infections, no pressure sores or ulcerations  Psych:   no anxiety, no depression, no nervousness, no unusual recent stress  Eyes:   no blurry vision, no floaters, no recent vision changes, + wears glasses or contacts  ENT:   no hearing loss, no loose or painful teeth, no dentures, last saw dentist within the past month  Hematologic:  no easy bruising, no abnormal bleeding, no clotting disorder, no frequent epistaxis  Endocrine:  no diabetes, does not check CBG's at home     Physical Exam:   BP 123/71 (BP Location: Right Arm)   Pulse 67   Temp 97.9 F (36.6 C) (Oral)   Resp 19   Ht 5\' 7"  (1.702 m)   Wt 155 lb 3.3 oz (70.4 kg)   SpO2 97%   BMI 24.31 kg/m   General:  Elderly but  well-appearing  HEENT:  Unremarkable   Neck:   no JVD, no bruits, no adenopathy   Chest:   clear to auscultation, symmetrical breath sounds, no wheezes, no rhonchi   CV:   RRR, no  murmur   Abdomen:  soft, non-tender, no masses   Extremities:  warm, well-perfused, pulses diminished, no lower extremity  edema  Rectal/GU  Deferred  Neuro:   Grossly non-focal and  symmetrical throughout  Skin:   Clean and dry, no rashes, no breakdown  Diagnostic Tests:  LEFT HEART CATH AND CORONARY ANGIOGRAPHY  Conclusion   Conclusions: 1. Severe three-vessel coronary artery disease, as detailed below. 2. Moderately reduced left ventricular contraction. 3. Upper normal to mildly elevated left ventricular filling pressure.  Recommendations: 1. Transfer to [facility] for surgical consultation for CABG. 2. Restart heparin infusion 2 hours after TR band removal. 3. Continue blood pressure control and aggressive secondary prevention with high intensity statin.  Nelva Bush, MD [facility] Pager: [phone]   Indications   Non-ST elevation (NSTEMI) myocardial infarction ([place]) [I21.4 (ICD-10-CM)]  Procedural Details/Technique   Technical Details Indication: 76 y.o. year-old man with history of prostate cancer, admitted with abdominal pain and marked hypertension. His family had noted Mr. Quebedeaux to be clutching his chest and visibly short of breath earlier in the day. Mr. Greenawalt denies any chest pain and is currently asymptomatic.  GFR: 54 ml/min  Procedure: The risks, benefits, complications, treatment options, and expected outcomes were discussed with the patient. The patient and/or family concurred with the proposed plan, giving informed consent. The patient was brought to the cath lab after IV hydration was begun and oral premedication was given. The patient was further sedated with Versed and Fentanyl. The right wrist was assessed with a modified Allens test which was normal. The right wrist was prepped and draped in a sterile fashion. 1% lidocaine was used for local anesthesia. Using the modified Seldinger access technique, a 28F slender Glidesheath was placed in the right radial artery. 3 mg Verapamil was given through the sheath. Heparin 3,500 units were administered.  Selective  coronary angiography was performed using 28F JL3.5 and AR-modified catheters to engage the left and right coronary arteries, respectively. Left heart catheterization was performed using a 28F JR4 catheter. Left ventriculogram was performed with a hand injection of contrast.  At the end of the procedure, the radial artery sheath was removed and a TR band applied to achieve patent hemostasis. There were no immediate complications. The patient was taken to the recovery area in stable condition.  Contrast used: 90 mL Isovue Fluoroscopy time: 7.1 min Radiation dose: 1,414 mGy   Estimated blood loss <50 mL.  During this procedure the patient was administered the following to achieve and maintain moderate conscious sedation: Versed 1 mg, Fentanyl 50 mcg, while the patient's heart rate, blood pressure, and oxygen saturation were continuously monitored. The period of conscious sedation was 26 minutes, of which I was present face-to-face 100% of this time.    Complications   Complications documented before study signed (12/04/2016 3:35 PM EDT)    No complications were associated with this study.  Documented by Nelva Bush, MD - 12/04/2016 3:22 PM EDT    Coronary Findings   Dominance: Right  Left Main  Vessel is large. Vessel is angiographically normal.  Left Anterior Descending  Ost LAD lesion, 80% stenosed. Hazy, eccentric lesion.  Prox LAD lesion, 70% stenosed. The lesion is calcified.  Mid LAD lesion, 80% stenosed. The lesion is calcified.  First Diagonal Branch  Vessel is small in size.  Ost 1st Diag lesion, 50% stenosed.  Second Diagonal Branch  Vessel is moderate in size.  Ost 2nd Diag lesion, 70% stenosed.  Ramus Intermedius  Vessel is moderate in size. Vessel is angiographically normal.  Left Circumflex  Vessel is moderate in size.  Ost Cx lesion, 80% stenosed.  First Obtuse Marginal Branch  Vessel is small in  size.  Second Obtuse Marginal Branch  Vessel is small in size.   Right Coronary Artery  Vessel is large.  Ost RCA to Prox RCA lesion, 95% stenosed. The lesion is eccentric. The lesion is calcified.  Mid RCA lesion, 30% stenosed.  Right Posterior Descending Artery  Vessel is moderate in size.  Ost RPDA lesion, 90% stenosed.  Right Posterior Atrioventricular Branch  Vessel is moderate in size.  Wall Motion              Left Heart   Left Ventricle The left ventricular size is normal. There is moderate left ventricular systolic dysfunction. Upper normal to mildly elevated LV end diastolic pressure. The left ventricular ejection fraction is 35-45% by visual estimate. There are LV function abnormalities due to global hypokinesis.    Aortic Valve There is no aortic valve stenosis.    Coronary Diagrams   Diagnostic Diagram       Implants     No implant documentation for this case.  PACS Images   Show images for CARDIAC CATHETERIZATION   Link to Procedure Log   Procedure Log    Hemo Data   AO Systolic Cath Pressure AO Diastolic Cath Pressure AO Mean Cath Pressure LV Systolic Cath Pressure LV End Diastolic  -- -- -- 696 mmHg 16 mmHg  -- -- -- 98 mmHg 12 mmHg  -- -- -- 104 mmHg 16 mmHg  106 57 mmHg 75 mmHg -- --  105 54 mmHg 69 mmHg -- --  107 54 mmHg 61 mmHg --       Impression:  Patient has severe three-vessel coronary artery disease with preserved left ventricular systolic function. He presents with acute coronary syndrome and has ruled in for an acute non-ST segment elevation myocardial infarction. He has remained pain free on medical therapy since hospitalization. I personally reviewed the patient's diagnostic cardiac catheterization. He has severe three-vessel coronary artery disease with subtotal occlusion of the proximal right coronary artery and high-grade proximal disease involving the both left anterior descending and the left circumflex coronary distributions. Left ventricular systolic function remains preserved. I agree  the patient needs surgical revascularization.  Risks associated with surgery will be mildly elevated because of the patient's advanced age, but overall he appears to be an acceptable candidate for surgery.  Plan:  I have reviewed the indications, risks, and potential benefits of coronary artery bypass grafting with the patient and his entire family.  Alternative treatment strategies have been discussed, including the relative risks, benefits and long term prognosis associated with medical therapy, percutaneous coronary intervention, and surgical revascularization.  The patient understands and accepts all potential associated risks of surgery including but not limited to risk of death, stroke or other neurologic complication, myocardial infarction, congestive heart failure, respiratory failure, renal failure, bleeding requiring blood transfusion and/or reexploration, aortic dissection or other major vascular complication, arrhythmia, heart block or bradycardia requiring permanent pacemaker, pneumonia, pleural effusion, wound infection, pulmonary embolus or other thromboembolic complication, chronic pain or other delayed complications related to median sternotomy, or the late recurrence of symptomatic ischemic heart disease and/or congestive heart failure.  The importance of long term risk modification have been emphasized.  Expectations for his postoperative convalescence at been discussed. All questions answered.  We plan to proceed with surgery on Friday, 12/07/2016.     I spent in excess of 120 minutes during the conduct of this hospital consultation and >50% of this time involved direct face-to-face encounter for counseling and/or coordination of the patient's  care.   Valentina Gu. Roxy Manns, MD 12/05/2016 6:12 PM

## 2016-12-05 NOTE — Progress Notes (Signed)
[id number] Gave pt and wife an OHS booklet and care guide. Wrote down how to view pre op video. Discussed the importance of IS and mobility after surgery. Pt can get 750 ml correctly on IS. Discussed sternal precautions and demonstrated how to get up and down without use of arms. Pt stated he needs hip surgery. Discussed how we can help him get up after surgery. Wife will be available 24/7 after discharge. Pt waiting to see surgeon. Will continue to follow. Graylon Good RN BSN 12/05/2016 3:08 PM

## 2016-12-05 NOTE — Progress Notes (Signed)
Progress Note  Patient Name: Kevin Burgess Date of Encounter: 12/05/2016  Primary Cardiologist: Dr. Saunders Revel (new)   Subjective   Feeling well.  Denies any recurrent chest or abdominal pain.  Breathing stable.    Inpatient Medications    Scheduled Meds: . amLODipine  5 mg Oral Daily  . aspirin EC  81 mg Oral Daily  . atorvastatin  80 mg Oral q1800  . carvedilol  6.25 mg Oral BID WC  . Chlorhexidine Gluconate Cloth  6 each Topical Daily  . [START ON 12/06/2016] Influenza vac split quadrivalent PF  0.5 mL Intramuscular Tomorrow-1000  . mupirocin ointment  1 application Nasal BID  . nitroGLYCERIN  1 inch Topical Q6H  . [START ON 12/06/2016] pneumococcal 23 valent vaccine  0.5 mL Intramuscular Tomorrow-1000  . sodium chloride flush  3 mL Intravenous Q12H   Continuous Infusions: . sodium chloride 250 mL (12/05/16 0615)  . heparin 850 Units/hr (12/05/16 1007)   PRN Meds: sodium chloride, acetaminophen, hydrALAZINE, nitroGLYCERIN, ondansetron (ZOFRAN) IV, sodium chloride flush   Vital Signs    Vitals:   12/04/16 2110 12/05/16 0455 12/05/16 0819 12/05/16 1139  BP: (!) 149/66 (!) 150/83 (!) 166/77 116/63  Pulse: 87 77 86 63  Resp: 19  20 15   Temp: 98.1 F (36.7 C) 98.2 F (36.8 C) 98.1 F (36.7 C) 97.9 F (36.6 C)  TempSrc: Oral Axillary Oral Oral  SpO2: 94% 99% 100% 99%  Weight: 70.4 kg (155 lb 3.3 oz) 70.4 kg (155 lb 3.3 oz)    Height: 5\' 7"  (1.702 m)       Intake/Output Summary (Last 24 hours) at 12/05/16 1311 Last data filed at 12/05/16 0615  Gross per 24 hour  Intake           296.46 ml  Output              460 ml  Net          -163.54 ml   Filed Weights   12/04/16 2105 12/04/16 2110 12/05/16 0455  Weight: 70.4 kg (155 lb 4.8 oz) 70.4 kg (155 lb 3.3 oz) 70.4 kg (155 lb 3.3 oz)    Telemetry    Sinus rhythm.  No arrhythmias.  - Personally Reviewed  ECG    Sinus rhythm.  Rate 74 bpm.  LVH with secondary repolarization abnormalities.  - Personally  Reviewed  Physical Exam   GEN: No acute distress.   Neck: No JVD Cardiac: RRR, no murmurs, rubs, or gallops.  Respiratory: Clear to auscultation bilaterally. GI: Soft, nontender, non-distended  MS: No edema; No deformity. Neuro:  Nonfocal  Psych: Normal affect   Labs    Chemistry Recent Labs Lab 12/04/16 0507 12/05/16 0304  NA 140 140  K 3.3* 3.5  CL 103 107  CO2 24 26  GLUCOSE 110* 98  BUN 21* 15  CREATININE 1.26* 1.16  CALCIUM 9.3 8.8*  PROT 7.8  --   ALBUMIN 4.2  --   AST 34  --   ALT 17  --   ALKPHOS 80  --   BILITOT 0.8  --   GFRNONAA 54* 60*  GFRAA >60 >60  ANIONGAP 13 7     Hematology Recent Labs Lab 12/04/16 0507 12/05/16 0304  WBC 11.7* 8.3  RBC 4.02* 3.54*  HGB 12.4* 10.7*  HCT 36.4* 32.2*  MCV 90.5 91.0  MCH 30.7 30.2  MCHC 34.0 33.2  RDW 14.3 14.0  PLT 274 262    Cardiac Enzymes Recent  Labs Lab 12/04/16 0507 12/04/16 1106 12/04/16 1801  TROPONINI 0.59* 0.57* 0.38*   No results for input(s): TROPIPOC in the last 168 hours.   BNPNo results for input(s): BNP, PROBNP in the last 168 hours.   DDimer No results for input(s): DDIMER in the last 168 hours.   Radiology    [place] Retroperitoneal Comp  Result Date: 12/04/2016 CLINICAL DATA:  Abdominal pain. EXAM: ULTRASOUND RETROPERITONEAL COMPLETE TECHNIQUE: Ultrasound examination of the abdominal aorta was performed to evaluate for abdominal aortic aneurysm. The common iliac arteries, IVC, and kidneys were also evaluated. COMPARISON:  None. FINDINGS: Abdominal Aorta No aneurysm identified. Maximum AP Diameter:  2.3 cm. Maximum TRV Diameter: 2.0 cm. Right Common Iliac Artery No aneurysm identified. Left Common Iliac Artery No aneurysm identified. IVC No abnormality visualized. Right Kidney Length: 9.3 cm Echogenicity within normal limits. No mass or hydronephrosis visualized. Left Kidney Length: 9 cm Echogenicity within normal limits. No mass or hydronephrosis visualized. IMPRESSION: No definite  abnormality seen in the visualized retroperitoneum. No abdominal aortic aneurysm is noted. Normal kidneys. Electronically Signed   By: Marijo Conception, M.D.   On: 12/04/2016 10:34    Cardiac Studies   Echo Pending  LHC 12/04/16: Conclusions: 1. Severe three-vessel coronary artery disease, as detailed below. 2. Moderately reduced left ventricular contraction. 3. Upper normal to mildly elevated left ventricular filling pressure.  Recommendations: 1. Transfer to [facility] for surgical consultation for CABG. 2. Restart heparin infusion 2 hours after TR band removal. 3. Continue blood pressure control and aggressive secondary prevention with high intensity statin.  Patient Profile     75 y.o. male with hypertension and NSTEMI.  Found to have 3v CAD on cath.   Assessment & Plan    # NSTEMI: # 3v CAD:  # Hyperlipidemia:  [name] is currently chest pain free. It looks as though he is scheduled for CABG with Dr. Roxy Manns on 9/28.  Continue aspirin, heparin and carvedilol.  LDL 188 12/05/16.  Atorvastatin started this admission.  He will need lipids and a CMP in 6 weeks.   # Hypertension: [name] was not on any medication prior to hospitalization.  Continue amlodipine and carvedilol.      For questions or updates, please contact [facility] Please consult [email or web address] for contact info under Cardiology/STEMI.      Signed, Skeet Latch, MD  12/05/2016, 1:11 PM

## 2016-12-05 NOTE — Progress Notes (Signed)
ANTICOAGULATION CONSULT NOTE - Follow Up Consult  Pharmacy Consult for Heparin Indication: chest pain/ACS/ NSTEMI - 3v CAD, awaiting TCTS consult for CABG  No Known Allergies  Patient Measurements: Height: 5\' 7"  (170.2 cm) Weight: 155 lb 3.3 oz (70.4 kg) IBW/kg (Calculated) : 66.1 Heparin Dosing Weight: 70.4 kg  Vital Signs: Temp: 98.1 F (36.7 C) (09/26 0819) Temp Source: Oral (09/26 0819) BP: 166/77 (09/26 0819) Pulse Rate: 86 (09/26 0819)  Labs:  Recent Labs  12/04/16 0507 12/04/16 1106 12/04/16 1801 12/05/16 0304  HGB 12.4*  --   --  10.7*  HCT 36.4*  --   --  32.2*  PLT 274  --   --  262  LABPROT 13.4  --   --   --   INR 1.03  --   --   --   HEPARINUNFRC 0.38  --   --  0.37  CREATININE 1.26*  --   --  1.16  TROPONINI 0.59* 0.57* 0.38*  --     Estimated Creatinine Clearance: 51.4 mL/min (by C-G formula based on SCr of 1.16 mg/dL).  Assessment:   76 yr old male transferred to [facility] from [facility] after cath on 12/04/16. 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    Heparin level was therapeutic (0.38) on 850 units/hr pre-cath on 9/25 am.   Heparin drip resumed ~9:30 pm last night post-cath at [facility].   Heparin level remains therapeutic (0.37) on 850 units/hr.  Goal of Therapy:  Heparin level 0.3-0.7 units/ml Monitor platelets by anticoagulation protocol: Yes   Plan:   Continue heparin drip at 850 units/hr.  Daily heparin level and CBC while on heparin.  Follow up plans.  Arty Baumgartner, [place] Pager: [phone] 12/05/2016,11:10 AM

## 2016-12-06 ENCOUNTER — Inpatient Hospital Stay (HOSPITAL_COMMUNITY): Payer: Medicare Other

## 2016-12-06 DIAGNOSIS — I7121 Aneurysm of the ascending aorta, without rupture: Secondary | ICD-10-CM | POA: Diagnosis present

## 2016-12-06 DIAGNOSIS — I340 Nonrheumatic mitral (valve) insufficiency: Secondary | ICD-10-CM

## 2016-12-06 DIAGNOSIS — I712 Thoracic aortic aneurysm, without rupture: Secondary | ICD-10-CM | POA: Diagnosis present

## 2016-12-06 DIAGNOSIS — Z01810 Encounter for preprocedural cardiovascular examination: Secondary | ICD-10-CM

## 2016-12-06 DIAGNOSIS — I25110 Atherosclerotic heart disease of native coronary artery with unstable angina pectoris: Secondary | ICD-10-CM

## 2016-12-06 DIAGNOSIS — I351 Nonrheumatic aortic (valve) insufficiency: Secondary | ICD-10-CM

## 2016-12-06 LAB — COMPREHENSIVE METABOLIC PANEL
ALK PHOS: 61 U/L (ref 38–126)
ALT: 15 U/L — ABNORMAL LOW (ref 17–63)
ANION GAP: 9 (ref 5–15)
AST: 26 U/L (ref 15–41)
Albumin: 3.3 g/dL — ABNORMAL LOW (ref 3.5–5.0)
BILIRUBIN TOTAL: 1.1 mg/dL (ref 0.3–1.2)
BUN: 16 mg/dL (ref 6–20)
CALCIUM: 9 mg/dL (ref 8.9–10.3)
CO2: 25 mmol/L (ref 22–32)
Chloride: 104 mmol/L (ref 101–111)
Creatinine, Ser: 1.15 mg/dL (ref 0.61–1.24)
GFR calc Af Amer: >60 mL/min (ref >60)
GLUCOSE: 91 mg/dL (ref 65–99)
POTASSIUM: 3.4 mmol/L — AB (ref 3.5–5.1)
Sodium: 138 mmol/L (ref 135–145)
TOTAL PROTEIN: 6.1 g/dL — AB (ref 6.5–8.1)

## 2016-12-06 LAB — PULMONARY FUNCTION TEST
DL/VA % pred: 139 %
DL/VA: 6.14 ml/min/mmHg/L
DLCO COR % PRED: 56 %
DLCO cor: 15.89 ml/min/mmHg
DLCO unc % pred: 46 %
DLCO unc: 13.29 ml/min/mmHg
FEF 25-75 POST: 2.02 L/s
FEF 25-75 PRE: 1.05 L/s
FEF2575-%Change-Post: 93 %
FEF2575-%Pred-Post: 104 %
FEF2575-%Pred-Pre: 54 %
FEV1-%CHANGE-POST: 9 %
FEV1-%Pred-Post: 55 %
FEV1-%Pred-Pre: 50 %
FEV1-Post: 1.49 L
FEV1-Pre: 1.36 L
FEV1FVC-%Change-Post: 13 %
FEV1FVC-%PRED-PRE: 105 %
FEV6-%CHANGE-POST: -1 %
FEV6-%PRED-POST: 48 %
FEV6-%Pred-Pre: 49 %
FEV6-POST: 1.7 L
FEV6-Pre: 1.72 L
FEV6FVC-%CHANGE-POST: 2 %
FEV6FVC-%PRED-POST: 107 %
FEV6FVC-%Pred-Pre: 105 %
FVC-%CHANGE-POST: -3 %
FVC-%PRED-POST: 45 %
FVC-%Pred-Pre: 47 %
FVC-Post: 1.7 L
FVC-Pre: 1.76 L
PRE FEV6/FVC RATIO: 98 %
Post FEV1/FVC ratio: 88 %
Post FEV6/FVC ratio: 100 %
Pre FEV1/FVC ratio: 77 %
RV % PRED: 62 %
RV: 1.49 L
TLC % PRED: 49 %
TLC: 3.18 L

## 2016-12-06 LAB — VAS US DOPPLER PRE CABG
LCCADDIAS: 8 cm/s
LCCADSYS: 40 cm/s
LCCAPDIAS: 6 cm/s
LCCAPSYS: 33 cm/s
LEFT ECA DIAS: -40 cm/s
LEFT VERTEBRAL DIAS: -11 cm/s
LICADDIAS: -11 cm/s
LICADSYS: -40 cm/s
LICAPSYS: 584 cm/s
Left ICA prox dias: 140 cm/s
RCCAPSYS: -75 cm/s
RIGHT ECA DIAS: -14 cm/s
RIGHT VERTEBRAL DIAS: -24 cm/s
Right CCA prox dias: -22 cm/s
Right cca dist sys: -81 cm/s

## 2016-12-06 LAB — PROTIME-INR
INR: 1.09
Prothrombin Time: 14 seconds (ref 11.4–15.2)

## 2016-12-06 LAB — CBC
HEMATOCRIT: 29.5 % — AB (ref 39.0–52.0)
Hemoglobin: 9.9 g/dL — ABNORMAL LOW (ref 13.0–17.0)
MCH: 30.3 pg (ref 26.0–34.0)
MCHC: 33.6 g/dL (ref 30.0–36.0)
MCV: 90.2 fL (ref 78.0–100.0)
PLATELETS: 228 10*3/uL (ref 150–400)
RBC: 3.27 MIL/uL — ABNORMAL LOW (ref 4.22–5.81)
RDW: 13.9 % (ref 11.5–15.5)
WBC: 6.2 10*3/uL (ref 4.0–10.5)

## 2016-12-06 LAB — BLOOD GAS, ARTERIAL
Acid-Base Excess: 1.9 mmol/L (ref 0.0–2.0)
BICARBONATE: 25.9 mmol/L (ref 20.0–28.0)
Drawn by: 511471
FIO2: 21
O2 SAT: 93.7 %
PATIENT TEMPERATURE: 97.8
PCO2 ART: 39 mmHg (ref 32.0–48.0)
PO2 ART: 67.8 mmHg — AB (ref 83.0–108.0)
pH, Arterial: 7.435 (ref 7.350–7.450)

## 2016-12-06 LAB — PREALBUMIN: PREALBUMIN: 24.2 mg/dL (ref 18–38)

## 2016-12-06 LAB — ABO/RH: ABO/RH(D): O POS

## 2016-12-06 LAB — HEMOGLOBIN A1C
Hgb A1c MFr Bld: 5.7 % — ABNORMAL HIGH (ref 4.8–5.6)
Mean Plasma Glucose: 116.89 mg/dL

## 2016-12-06 LAB — HEPARIN LEVEL (UNFRACTIONATED): Heparin Unfractionated: 0.53 IU/mL (ref 0.30–0.70)

## 2016-12-06 LAB — ECHOCARDIOGRAM COMPLETE
HEIGHTINCHES: 67 in
WEIGHTICAEL: 2486.79 [oz_av]

## 2016-12-06 LAB — APTT: APTT: 108 s — AB (ref 24–36)

## 2016-12-06 MED ORDER — POTASSIUM CHLORIDE 2 MEQ/ML IV SOLN
80.0000 meq | INTRAVENOUS | Status: DC
  Filled 2016-12-06: qty 40

## 2016-12-06 MED ORDER — VANCOMYCIN HCL 1000 MG IV SOLR
INTRAVENOUS | Status: AC
  Administered 2016-12-07: 1000 mL
  Filled 2016-12-06: qty 1000

## 2016-12-06 MED ORDER — TRANEXAMIC ACID (OHS) PUMP PRIME SOLUTION
2.0000 mg/kg | INTRAVENOUS | Status: DC
  Filled 2016-12-06: qty 1.41

## 2016-12-06 MED ORDER — CHLORHEXIDINE GLUCONATE 0.12 % MT SOLN
15.0000 mL | Freq: Once | OROMUCOSAL | Status: AC
  Administered 2016-12-07: 15 mL via OROMUCOSAL
  Filled 2016-12-06: qty 15

## 2016-12-06 MED ORDER — CHLORHEXIDINE GLUCONATE 4 % EX LIQD
60.0000 mL | Freq: Once | CUTANEOUS | Status: AC
  Administered 2016-12-06: 4 via TOPICAL
  Filled 2016-12-06: qty 60

## 2016-12-06 MED ORDER — SODIUM CHLORIDE 0.9 % IV SOLN
INTRAVENOUS | Status: DC
  Filled 2016-12-06: qty 1

## 2016-12-06 MED ORDER — ALBUTEROL SULFATE (2.5 MG/3ML) 0.083% IN NEBU
2.5000 mg | INHALATION_SOLUTION | Freq: Once | RESPIRATORY_TRACT | Status: AC
  Administered 2016-12-06: 2.5 mg via RESPIRATORY_TRACT

## 2016-12-06 MED ORDER — TEMAZEPAM 7.5 MG PO CAPS: 15.0000 mg | ORAL_CAPSULE | Freq: Once | ORAL | Status: DC | PRN

## 2016-12-06 MED ORDER — CHLORHEXIDINE GLUCONATE 4 % EX LIQD
60.0000 mL | Freq: Once | CUTANEOUS | Status: AC
  Administered 2016-12-07: 4 via TOPICAL
  Filled 2016-12-06: qty 60

## 2016-12-06 MED ORDER — BISACODYL 5 MG PO TBEC
5.0000 mg | DELAYED_RELEASE_TABLET | Freq: Once | ORAL | Status: AC
  Administered 2016-12-06: 5 mg via ORAL
  Filled 2016-12-06: qty 1

## 2016-12-06 MED ORDER — TRANEXAMIC ACID 1000 MG/10ML IV SOLN
1.5000 mg/kg/h | INTRAVENOUS | Status: DC
  Filled 2016-12-06: qty 25

## 2016-12-06 MED ORDER — KENNESTONE BLOOD CARDIOPLEGIA (KBC) MANNITOL SYRINGE (20%, 32ML)
32.0000 mL | Freq: Once | INTRAVENOUS | Status: DC
  Filled 2016-12-06: qty 1

## 2016-12-06 MED ORDER — TRANEXAMIC ACID (OHS) BOLUS VIA INFUSION
15.0000 mg/kg | INTRAVENOUS | Status: AC
  Administered 2016-12-07: 1057.5 mg via INTRAVENOUS
  Filled 2016-12-06: qty 1058

## 2016-12-06 MED ORDER — KENNESTONE BLOOD CARDIOPLEGIA VIAL
13.0000 mL | Freq: Once | Status: DC
  Filled 2016-12-06: qty 1

## 2016-12-06 MED ORDER — DEXMEDETOMIDINE HCL IN NACL 400 MCG/100ML IV SOLN
0.1000 ug/kg/h | INTRAVENOUS | Status: DC
  Filled 2016-12-06: qty 100

## 2016-12-06 MED ORDER — IOPAMIDOL (ISOVUE-370) INJECTION 76%
INTRAVENOUS | Status: AC
  Administered 2016-12-06: 50 mL
  Filled 2016-12-06: qty 50

## 2016-12-06 MED ORDER — PLASMA-LYTE 148 IV SOLN
INTRAVENOUS | Status: AC
  Administered 2016-12-07: 500 mL
  Filled 2016-12-06: qty 2.5

## 2016-12-06 MED ORDER — SODIUM CHLORIDE 0.9 % IV SOLN
30.0000 ug/min | INTRAVENOUS | Status: DC
  Filled 2016-12-06: qty 2

## 2016-12-06 MED ORDER — DOPAMINE-DEXTROSE 3.2-5 MG/ML-% IV SOLN
0.0000 ug/kg/min | INTRAVENOUS | Status: DC
  Filled 2016-12-06: qty 250

## 2016-12-06 MED ORDER — SODIUM CHLORIDE 0.9 % IV SOLN
INTRAVENOUS | Status: DC
  Filled 2016-12-06: qty 30

## 2016-12-06 MED ORDER — MAGNESIUM SULFATE 50 % IJ SOLN
40.0000 meq | INTRAMUSCULAR | Status: DC
  Filled 2016-12-06: qty 10

## 2016-12-06 MED ORDER — VANCOMYCIN HCL 10 G IV SOLR
1250.0000 mg | INTRAVENOUS | Status: AC
  Administered 2016-12-07: 1250 mg via INTRAVENOUS
  Filled 2016-12-06: qty 1250

## 2016-12-06 MED ORDER — NITROGLYCERIN IN D5W 200-5 MCG/ML-% IV SOLN
2.0000 ug/min | INTRAVENOUS | Status: DC
  Filled 2016-12-06: qty 250

## 2016-12-06 MED ORDER — DEXTROSE 5 % IV SOLN
750.0000 mg | INTRAVENOUS | Status: DC
  Filled 2016-12-06: qty 750

## 2016-12-06 MED ORDER — METOPROLOL TARTRATE 12.5 MG HALF TABLET
12.5000 mg | ORAL_TABLET | Freq: Once | ORAL | Status: AC
  Administered 2016-12-07: 12.5 mg via ORAL
  Filled 2016-12-06: qty 1

## 2016-12-06 MED ORDER — EPINEPHRINE PF 1 MG/ML IJ SOLN
0.0000 ug/min | INTRAVENOUS | Status: DC
  Filled 2016-12-06: qty 4

## 2016-12-06 MED ORDER — DEXTROSE 5 % IV SOLN
1.5000 g | INTRAVENOUS | Status: AC
  Administered 2016-12-07: 1.5 g via INTRAVENOUS
  Administered 2016-12-07: .75 g via INTRAVENOUS
  Filled 2016-12-06: qty 1.5

## 2016-12-06 NOTE — Progress Notes (Signed)
ANTICOAGULATION CONSULT NOTE - Follow Up Consult  Pharmacy Consult for Heparin Indication: chest pain/ACS/ NSTEMI - 3v CAD, awaiting TCTS consult for CABG  No Known Allergies  Patient Measurements: Height: 5\' 7"  (170.2 cm) Weight: 155 lb 6.8 oz (70.5 kg) IBW/kg (Calculated) : 66.1 Heparin Dosing Weight: 70.4 kg  Vital Signs: Temp: 97.8 F (36.6 C) (09/27 0418) Temp Source: Oral (09/27 0418) BP: 150/73 (09/27 0418) Pulse Rate: 73 (09/27 0418)  Labs:  Recent Labs  12/04/16 0507 12/04/16 1106 12/04/16 1801 12/05/16 0304 12/06/16 0433  HGB 12.4*  --   --  10.7* 9.9*  HCT 36.4*  --   --  32.2* 29.5*  PLT 274  --   --  262 228  APTT  --   --   --   --  108*  LABPROT 13.4  --   --   --  14.0  INR 1.03  --   --   --  1.09  HEPARINUNFRC 0.38  --   --  0.37 0.53  CREATININE 1.26*  --   --  1.16 1.15  TROPONINI 0.59* 0.57* 0.38*  --   --    Estimated Creatinine Clearance: 51.9 mL/min (by C-G formula based on SCr of 1.15 mg/dL).  Assessment:   76 yr old male transferred to [facility] from [facility] after cath on 12/04/16. Severe 3v CAD, will be going for CABG on 9/28.   His H/H is down slightly but without noted bleeding and platelets are stable.   Heparin level is therapeutic (0.53) on 850 units/hr    Goal of Therapy:  Heparin level 0.3-0.7 units/ml Monitor platelets by anticoagulation protocol: Yes   Plan:   Continue heparin drip at 850 units/hr.  Daily heparin level and CBC while on heparin.  Rober Minion, PharmD., MS Clinical Pharmacist Pager:  [phone] Thank you for allowing pharmacy to be part of this patients care team. Pager: [phone] 12/06/2016,9:34 AM

## 2016-12-06 NOTE — Progress Notes (Signed)
      [address]       [place] [address]             [phone]     CARDIOTHORACIC SURGERY PROGRESS NOTE  Subjective: No chest pain.  No SOB  Objective: Vital signs in last 24 hours: Temp:  [97.8 F (36.6 C)-98 F (36.7 C)] 97.8 F (36.6 C) (09/27 0418) Pulse Rate:  [58-73] 58 (09/27 1038) Cardiac Rhythm: Ventricular tachycardia (09/27 1606) Resp:  [16-18] 18 (09/27 1038) BP: (110-150)/(72-74) 120/72 (09/27 1038) SpO2:  [93 %-99 %] 98 % (09/27 1038) Weight:  [155 lb 6.8 oz (70.5 kg)] 155 lb 6.8 oz (70.5 kg) (09/27 0418)  Physical Exam:  Rhythm:   sinus  Breath sounds: clear  Heart sounds:  RRR  Incisions:  n/a  Abdomen:  soft  Extremities:  warm   Intake/Output from previous day: 09/26 0701 - 09/27 0700 In: 900.4 [P.O.:720; I.V.:180.4] Out: 725 [Urine:725] Intake/Output this shift: Total I/O In: 812.5 [P.O.:720; I.V.:92.5] Out: 175 [Urine:175]  Lab Results:  Recent Labs  12/05/16 0304 12/06/16 0433  WBC 8.3 6.2  HGB 10.7* 9.9*  HCT 32.2* 29.5*  PLT 262 228   BMET:  Recent Labs  12/05/16 0304 12/06/16 0433  NA 140 138  K 3.5 3.4*  CL 107 104  CO2 26 25  GLUCOSE 98 91  BUN 15 16  CREATININE 1.16 1.15  CALCIUM 8.8* 9.0    CBG (last 3)  No results for input(s): GLUCAP in the last 72 hours. PT/INR:   Recent Labs  12/06/16 0433  LABPROT 14.0  INR 1.09    CXR:  CHEST  2 VIEW  COMPARISON:  None.  FINDINGS: Cardiac silhouette mildly enlarged. Thoracic and upper abdominal aorta mildly atherosclerotic. Hilar and mediastinal contours otherwise unremarkable. Mild diffuse interstitial pulmonary edema. No confluent airspace consolidation. Small bilateral pleural effusions. Degenerative changes involving the thoracic spine.  IMPRESSION: 1. Mild CHF, with mild cardiomegaly and mild diffuse interstitial pulmonary edema. 2.  Aortic Atherosclerosis (ICD10-170.0)   Electronically Signed   By: Evangeline Dakin M.D.  On: 12/06/2016 08:03   Assessment/Plan: S/P Procedure(s) (LRB): CORONARY ARTERY BYPASS GRAFTING (CABG) (N/A) TRANSESOPHAGEAL ECHOCARDIOGRAM (TEE) (N/A)  Stable from cardiac standpoint. Appreciate input from Dr Bridgett Larsson re question of possible left ICA stenosis Tentatively for CABG tomorrow pending results of CTA I have again reviewed the indications, risks and potential benefits of surgery - all questions answered  Rexene Alberts, MD 12/06/2016 5:13 PM

## 2016-12-06 NOTE — Progress Notes (Signed)
Progress Note  Patient Name: Kevin Burgess Date of Encounter: 12/06/2016  Primary Cardiologist: Dr. Saunders Revel (new)   Subjective   No chest pain or SOB. Wants to get the surgery done and get well.   Inpatient Medications    Scheduled Meds: . amLODipine  5 mg Oral Daily  . aspirin EC  81 mg Oral Daily  . atorvastatin  80 mg Oral q1800  . carvedilol  6.25 mg Oral BID WC  . Chlorhexidine Gluconate Cloth  6 each Topical Daily  . [START ON 12/07/2016] heparin-papaverine-plasmalyte irrigation   Irrigation To OR  . Influenza vac split quadrivalent PF  0.5 mL Intramuscular Tomorrow-1000  . [START ON 12/07/2016] [place] Blood Cardioplegia (KBC) lidocaine 2% Syringe (31mL)  13 mL Intracoronary Once  . [START ON 12/07/2016] [place] Blood Cardioplegia (KBC) lidocaine 2% Syringe (73mL)  13 mL Intracoronary Once  . [START ON 12/07/2016] [place] Blood Cardioplegia (KBC) mannitol 20% Syringe (38mL)  32 mL Intracoronary Once  . [START ON 12/07/2016] [place] Blood Cardioplegia (KBC) mannitol 20% Syringe (44mL)  32 mL Intracoronary Once  . [START ON 12/07/2016] magnesium sulfate  40 mEq Other To OR  . mupirocin ointment  1 application Nasal BID  . nitroGLYCERIN  1 inch Topical Q6H  . pneumococcal 23 valent vaccine  0.5 mL Intramuscular Tomorrow-1000  . [START ON 12/07/2016] potassium chloride  80 mEq Other To OR  . sodium chloride flush  3 mL Intravenous Q12H  . [START ON 12/07/2016] tranexamic acid  15 mg/kg Intravenous To OR  . [START ON 12/07/2016] tranexamic acid  2 mg/kg Intracatheter To OR  . [START ON 12/07/2016] vancomycin 1000 mg in NS (1000 ml) irrigation for Dr. Roxy Manns case   Irrigation To OR   Continuous Infusions: . sodium chloride 250 mL (12/05/16 0615)  . [START ON 12/07/2016] cefUROXime (ZINACEF)  IV    . [START ON 12/07/2016] dexmedetomidine    . [START ON 12/07/2016] DOPamine    . [START ON 12/07/2016] epinephrine    . [START ON 12/07/2016] heparin 30,000 units/NS 1000 mL solution  for CELLSAVER    . heparin 850 Units/hr (12/05/16 1007)  . [START ON 12/07/2016] insulin (NOVOLIN-R) infusion    . [START ON 12/07/2016] nitroGLYCERIN    . [START ON 12/07/2016] phenylephrine 20mg /221mL NS (0.08mg /ml) infusion    . [START ON 12/07/2016] tranexamic acid (CYKLOKAPRON) infusion ([facility])     PRN Meds: sodium chloride, acetaminophen, hydrALAZINE, nitroGLYCERIN, ondansetron (ZOFRAN) IV, sodium chloride flush   Vital Signs    Vitals:   12/05/16 1617 12/05/16 2011 12/06/16 0008 12/06/16 0418  BP: 123/71  110/74 (!) 150/73  Pulse: 67  66 73  Resp: 19 18  16   Temp:  98 F (36.7 C) 98 F (36.7 C) 97.8 F (36.6 C)  TempSrc:  Oral Oral Oral  SpO2: 97% 93% 98% 99%  Weight:    155 lb 6.8 oz (70.5 kg)  Height:        Intake/Output Summary (Last 24 hours) at 12/06/16 1032 Last data filed at 12/06/16 0929  Gross per 24 hour  Intake           1126.5 ml  Output              475 ml  Net            651.5 ml   Filed Weights   12/04/16 2110 12/05/16 0455 12/06/16 0418  Weight: 155 lb 3.3 oz (70.4 kg) 155 lb 3.3 oz (70.4 kg) 155 lb  6.8 oz (70.5 kg)    Telemetry    Sinus rhythm, sinus brady to mid-40s w/ out sx.  No other arrhythmias.  - Personally Reviewed  ECG    n/a  Physical Exam   GEN: No acute distress.   Neck: No JVD elevation Cardiac: RRR, soft murmur, no rubs, or gallops.  Respiratory: Clear to auscultation bilaterally. GI: Soft, nontender, non-distended  MS: No edema; No deformity. Neuro:  Nonfocal  Psych: Normal affect   Labs    Chemistry  Recent Labs Lab 12/04/16 0507 12/05/16 0304 12/06/16 0433  NA 140 140 138  K 3.3* 3.5 3.4*  CL 103 107 104  CO2 24 26 25   GLUCOSE 110* 98 91  BUN 21* 15 16  CREATININE 1.26* 1.16 1.15  CALCIUM 9.3 8.8* 9.0  PROT 7.8  --  6.1*  ALBUMIN 4.2  --  3.3*  AST 34  --  26  ALT 17  --  15*  ALKPHOS 80  --  61  BILITOT 0.8  --  1.1  GFRNONAA 54* 60* >60  GFRAA >60 >60 >60  ANIONGAP 13 7 9       Hematology  Recent Labs Lab 12/04/16 0507 12/05/16 0304 12/06/16 0433  WBC 11.7* 8.3 6.2  RBC 4.02* 3.54* 3.27*  HGB 12.4* 10.7* 9.9*  HCT 36.4* 32.2* 29.5*  MCV 90.5 91.0 90.2  MCH 30.7 30.2 30.3  MCHC 34.0 33.2 33.6  RDW 14.3 14.0 13.9  PLT 274 262 228    Cardiac Enzymes  Recent Labs Lab 12/04/16 0507 12/04/16 1106 12/04/16 1801  TROPONINI 0.59* 0.57* 0.38*    Lab Results  Component Value Date   CHOL 256 (H) 12/05/2016   HDL 47 12/05/2016   LDLCALC 188 (H) 12/05/2016   TRIG 106 12/05/2016   CHOLHDL 5.4 12/05/2016      Radiology    Dg Chest 2 View  Result Date: 12/06/2016 CLINICAL DATA:  Preoperative respiratory evaluation prior to CABG. Non ST elevation MI. History of prostate cancer. EXAM: CHEST  2 VIEW COMPARISON:  None. FINDINGS: Cardiac silhouette mildly enlarged. Thoracic and upper abdominal aorta mildly atherosclerotic. Hilar and mediastinal contours otherwise unremarkable. Mild diffuse interstitial pulmonary edema. No confluent airspace consolidation. Small bilateral pleural effusions. Degenerative changes involving the thoracic spine. IMPRESSION: 1. Mild CHF, with mild cardiomegaly and mild diffuse interstitial pulmonary edema. 2.  Aortic Atherosclerosis (ICD10-170.0) Electronically Signed   By: Evangeline Dakin M.D.   On: 12/06/2016 08:03    Cardiac Studies   Echo - done but not read  [place] 12/04/16: Conclusions: 1. Severe three-vessel coronary artery disease, as detailed below. 2. Moderately reduced left ventricular contraction. 3. Upper normal to mildly elevated left ventricular filling pressure.  Recommendations: 1. Transfer to [facility] for surgical consultation for CABG. 2. Restart heparin infusion 2 hours after TR band removal. 3. Continue blood pressure control and aggressive secondary prevention with high intensity statin.  Patient Profile     76 y.o. male with hypertension and NSTEMI.  Found to have 3v CAD on cath.   Assessment &  Plan    # NSTEMI: # 3v CAD:  - No CP on current rx - scheduled for CABG 09/28, Dr Roxy Manns  - on ASA, heparin, BB - has some bradycardia w/ BB, but is asymptomatic, no dose change.  # Hyperlipidemia:  -  Lipid profile above. Lipitor started this admit - recheck lipids and CMET in 6 weeks.  # Hypertension:  - no rx PTA - Now on Coreg  and Norvasc, tolerating well.      For questions or updates, please contact [place] Please consult [email or web address] for contact info under Cardiology/STEMI.      Jonetta Speak, PA-C  12/06/2016, 10:32 AM

## 2016-12-06 NOTE — Progress Notes (Signed)
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  Bilateral vertebral arteries appear patent and antegrade.  Upper Extremity Right Left  Brachial Pressures 125, Tri 131, Tri  Radial Waveforms Bi Tri  Ulnar Waveforms Tri Tri  Palmar Arch (Allen's Test) waveform is unchanged with radial compression and decreases less than 50% with ulnar compression waveform increases greater than 50% with radial compression and reverses with ulnar compression    Lower  Extremity Right Left  Dorsalis Pedis 160, Tri 168, Tri  Posterior Tibial 169, Tri 164, Tri  Ankle/Brachial Indices 1.3 1.3    Findings:  Bilateral ABI's are within normal limits  Preliminary result called to [facility] @ Dr. Guy Sandifer office@ 13:45  [place], RVT 1:49 PM  12/06/2016

## 2016-12-06 NOTE — Care Management Note (Signed)
Case Management Note Marvetta Gibbons RN, BSN Unit 4E-Case Manager [phone]  Patient Details  Name: Ishaq Maffei MRN: [id number] Date of Birth: June 04, 1940  Subjective/Objective:   Pt admitted with NSTEMI, severe  3VD found on cath plan for CABG on Friday 12/07/16               Action/Plan: PTA pt lived at home with wife- independent prior to admission, [place] and did not require any assist devices for mobility-was physically active- CM will follow post op for d/c needs  Expected Discharge Date:                  Expected Discharge Plan:     In-House Referral:     Discharge planning Services  CM Consult  Post Acute Care Choice:    Choice offered to:     DME Arranged:    DME Agency:     HH Arranged:    [place] Agency:     Status of Service:  In process, will continue to follow  If discussed at Long Length of Stay Meetings, dates discussed:    Discharge Disposition:   Additional Comments:  Dawayne Patricia, RN 12/06/2016, 11:35 AM

## 2016-12-06 NOTE — Consult Note (Addendum)
[place]   Reason for Consult:  Carotid stenosis Requesting Physician:  Roxy Manns MRN #:  [id number]  History of Present Illness: Kevin Burgess is a 76 y.o. (1940/07/28) male  who states a couple of days ago, he started coughing and was short of breath and went to [facility].  He states he did not have chest pain.  At that time, he was found to be hypertensive and chest xray revealed pulmonary edema.  His troponin levels were elevated and his sx resolved with NTG.  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  He was transferred to [facility] for further evaluation and treatment.  While here, he underwent a carotid duplex that revealed bilateral carotid artery stenosis with the left > right.  He states that he had a mini TIA 18 years ago that affected the vision in his left eye.  He continues to have decreased vision in that eye.  Otherwise, he denies any numbness or weakness in his extremities or speech difficulty.  He states that he has never smoked and his parents did not have any hx of CAD that he can recall.  His mother is deceased and had hx of DM and his father is deceased and had hx of prostate cancer.    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  The pt is on a statin for cholesterol management. He is on a beta blocker for blood pressure control.    Past Medical History:  Diagnosis Date  . History of hiatal hernia   . OA (osteoarthritis)    a. Pending R Hip THA  . Prostate cancer ([place])    a. s/p TURP ~ 1999.    Past Surgical History:  Procedure Laterality Date  . LEFT HEART CATH AND CORONARY ANGIOGRAPHY N/A 12/04/2016   Procedure: LEFT HEART CATH AND CORONARY ANGIOGRAPHY;  Surgeon: Nelva Bush, MD;  Location: [facility];  Service: Cardiovascular;  Laterality: N/A;  . TRANSURETHRAL RESECTION OF PROSTATE     a. ~ 1999    No Known Allergies  Prior to Admission  medications   Medication Sig Start Date End Date Taking? Authorizing Provider  alendronate (FOSAMAX) 70 MG tablet Take 70 mg by mouth every Thursday.  09/27/16  Yes [provider]  bicalutamide (CASODEX) 50 MG tablet Take 50 mg by mouth daily. 10/29/16  Yes [provider]  Cholecalciferol (VITAMIN D-3) 5000 units TABS Take 5,000 Units by mouth daily.   Yes [provider]  ibuprofen (ADVIL,MOTRIN) 800 MG tablet Take 800 mg by mouth 3 (three) times daily as needed for pain. 12/01/16  Yes [provider]  omeprazole (PRILOSEC) 20 MG capsule Take 20 mg by mouth daily. 11/22/16  Yes [provider]  aspirin 81 MG chewable tablet Chew 1 tablet (81 mg total) by mouth daily. 12/05/16   Dustin Flock, MD  atorvastatin (LIPITOR) 80 MG tablet Take 1 tablet (80 mg total) by mouth daily at 6 PM. 12/05/16   Dustin Flock, MD  carvedilol (COREG) 6.25 MG tablet Take 1 tablet (6.25 mg total) by mouth 2 (two) times daily with a meal. 12/04/16   Dustin Flock, MD  heparin 100-0.45 UNIT/ML-% infusion Inject 850 Units/hr into the vein continuous. 12/04/16   Dustin Flock, MD  nitroGLYCERIN (NITROGLYN) 2 % ointment Apply 1 inch topically every 6 (six) hours. 12/05/16   Dustin Flock, MD  Statin:  Yes.   Beta Blocker:  Yes.   Aspirin:  Yes.   ACEI:  No. ARB:  No. CCB use:  No Other antiplatelets/anticoagulants:  Yes.   heparin gtt   Social History   Social History  . Marital status: Married    Spouse name: N/A  . Number of children: N/A  . Years of education: N/A   Occupational History  . 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 Smoking status: Never Smoker  . Smokeless tobacco: Never Used  . Alcohol use No  . Drug use: No  . Sexual activity: Not on file   Other Topics Concern  . Not on file   Social History Narrative   [place] of [place]  Raises chickens.     Family History Mother deceased.  Hx of DM Father deceased.  Hx of Prostate cancer  ROS: [x]  Positive   [ ]  Negative   [ ]  All sytems reviewed and are negative  Cardiac: [x]  chest pain/pressure []  palpitations []  SOB lying flat [x]  DOE  Vascular: []  pain in legs while walking []  pain in legs at rest []  pain in legs at night []  non-healing ulcers []  hx of DVT []  swelling in legs  Pulmonary: []  productive cough []  asthma/wheezing []  home O2  Neurologic: []  weakness in []  arms []  legs []  numbness in []  arms []  legs []  hx of CVA []  mini stroke [] difficulty speaking or slurred speech [x]  vision loss left eye 18 years ago with TIA []  dizziness  Hematologic: [x]  hx of cancer-prostate []  bleeding problems []  problems with blood clotting easily  Endocrine:   []  diabetes []  thyroid disease  GI []  vomiting blood []  blood in stool [x]  hx hiatal hernia  GU: []  CKD/renal failure []  HD--[]  M/W/F or []  T/T/S []  burning with urination []  blood in urine  Psychiatric: []  anxiety []  depression  Musculoskeletal: [x]  arthritis-hip pain-right []  joint pain  Integumentary: []  rashes []  ulcers  Constitutional: []  fever []  chills   Physical Examination  Vitals:   12/06/16 0418 12/06/16 1038  BP: (!) 150/73 120/72  Pulse: 73 (!) 58  Resp: 16 18  Temp: 97.8 F (36.6 C)   SpO2: 99% 98%   Body mass index is 24.34 kg/m.  General:  WDWN in NAD Gait: Not observed HENT: WNL, normocephalic Pulmonary: normal non-labored breathing, without Rales, rhonchi,  wheezing Cardiac: regular, without  Murmurs, rubs or gallops; without carotid bruits Abdomen:  soft, NT/ND, no masses Skin: without rashes Vascular Exam/Pulses:  Right Left  Radial 2+ (normal) 2+ (normal)  Ulnar 2+ (normal) 2+ (normal)  Popliteal Unable to palpate  Unable to palpate   DP Unable to palpate  Unable to palpate   PT 2+ (normal) 2+ (normal)   Extremities: without ischemic changes,  without Gangrene , without cellulitis; without open wounds; tiny wound on the tip of the right great toe from where his wife clipped his skin with the clippers-healing Musculoskeletal: no muscle wasting or atrophy  Neurologic: A&O X 3;  No focal weakness or paresthesias are detected; speech is fluent/normal Psychiatric:  The pt has Normal affect. Lymph : No Cervical, Axillary, or Inguinal lymphadenopathy    Laboratory   CBC CBC Latest Ref Rng & Units 12/06/2016 12/05/2016 12/04/2016  WBC 4.0 - 10.5 K/uL 6.2 8.3 11.7(H)  Hemoglobin 13.0 - 17.0 g/dL 9.9(L) 10.7(L) 12.4(L)  Hematocrit 39.0 -  52.0 % 29.5(L) 32.2(L) 36.4(L)  Platelets 150 - 400 K/uL 228 262 274    BMP BMP Latest Ref Rng & Units 12/06/2016 12/05/2016 12/04/2016  Glucose 65 - 99 mg/dL 91 98 110(H)  BUN 6 - 20 mg/dL 16 15 21(H)  Creatinine 0.61 - 1.24 mg/dL 1.15 1.16 1.26(H)  Sodium 135 - 145 mmol/L 138 140 140  Potassium 3.5 - 5.1 mmol/L 3.4(L) 3.5 3.3(L)  Chloride 101 - 111 mmol/L 104 107 103  CO2 22 - 32 mmol/L 25 26 24   Calcium 8.9 - 10.3 mg/dL 9.0 8.8(L) 9.3    Coagulation Lab Results  Component Value Date   INR 1.09 12/06/2016   INR 1.03 12/04/2016   No results found for: PTT  Lipids    Component Value Date/Time   CHOL 256 (H) 12/05/2016 0304   TRIG 106 12/05/2016 0304   HDL 47 12/05/2016 0304   CHOLHDL 5.4 12/05/2016 0304   VLDL 21 12/05/2016 0304   LDLCALC 188 (H) 12/05/2016 0304    Radiology     Dg Chest 2 View  Result Date: 12/06/2016 CLINICAL DATA:  Preoperative respiratory evaluation prior to CABG. Non ST elevation MI. History of prostate cancer. EXAM: CHEST  2 VIEW COMPARISON:  None. FINDINGS: Cardiac silhouette mildly enlarged. Thoracic and upper abdominal aorta mildly atherosclerotic. Hilar and mediastinal contours otherwise unremarkable. Mild diffuse interstitial pulmonary edema. No confluent airspace consolidation. Small bilateral pleural effusions. Degenerative changes involving the thoracic  spine. IMPRESSION: 1. Mild CHF, with mild cardiomegaly and mild diffuse interstitial pulmonary edema. 2.  Aortic Atherosclerosis (ICD10-170.0) Electronically Signed   By: Evangeline Dakin M.D.   On: 12/06/2016 08:03     Non-Invasive Vascular Imaging:   Carotid duplex 12/06/16: Right proximal ICA demonstrates a 60-79% stenosis, the left proximal ICA demonstrated a 80-99% stenosis, with dampened flow in the mid/distal ICA.  Bilateral vertebral arteries appear patent and antegrade.     ASSESSMENT/PLAN: This is a 76 y.o. 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    -pt is asymptomatic from his bilateral carotid artery stenosis.  He states he does have a hx of TIA in the past that caused him visual disturbance in the left eye.  He continues to have decreased vision in the left eye.    -the left ICA stenosis is 80-99% and will need to be addressed.  Dr. Bridgett Larsson to see pt to determine timing of intervention.  Pt is to have CABG in the AM.  Right ICA stenosis with 60-79% and will need to be followed with duplex as outpatient.    Leontine Locket, PA-C [facility] [phone]   Addendum  I have independently interviewed and examined the patient, and I agree with the physician assistant's findings.  I reviewed the B carotid duplex there appears to be some discrepancy between doppler velocities and plaque morphology as evident in the multiple PSV documented in the scan.  I would obtained CTA neck as confirmatory study.    - Pt is remotely asx.  Can not verify that L ICA was source of what sounds like a retinal artery embolus. - Essentially this patient is asx at this point, so the current literature would lean toward separating the L CEA and CABG as he is sx from the CAD. - Additional recommendations to follow once CTA available.   Adele Barthel, MD, FACS [facility] [place] Office:  [phone] Pager: [phone]  12/06/2016,  4:35 PM   Addendum  Radiology     Dg Chest 2 View  Result Date: 12/06/2016 CLINICAL DATA:  Preoperative respiratory evaluation prior to CABG. Non ST elevation MI. History of prostate cancer. EXAM: CHEST  2 VIEW COMPARISON:  None. FINDINGS: Cardiac silhouette mildly enlarged. Thoracic and upper abdominal aorta mildly atherosclerotic. Hilar and mediastinal contours otherwise unremarkable. Mild diffuse interstitial pulmonary edema. No confluent airspace consolidation. Small bilateral pleural effusions. Degenerative changes involving the thoracic spine. IMPRESSION: 1. Mild CHF, with mild cardiomegaly and mild diffuse interstitial pulmonary edema. 2.  Aortic Atherosclerosis (ICD10-170.0) Electronically Signed   By: Evangeline Dakin M.D.   On: 12/06/2016 08:03   Ct Angio Neck W Or Wo Contrast  Result Date: 12/06/2016 CLINICAL DATA:  Followup carotid artery disease. Outside carotid ultrasound report describes proximal LEFT ICA severe stenosis. EXAM: CT ANGIOGRAPHY NECK TECHNIQUE: Multidetector CT imaging of the neck was performed using the standard protocol during bolus administration of intravenous contrast. Multiplanar CT image reconstructions and MIPs were obtained to evaluate the vascular anatomy. Carotid stenosis measurements (when applicable) are obtained utilizing NASCET criteria, using the distal internal carotid diameter as the denominator. CONTRAST:  50 cc Isovue 370 COMPARISON:  None. 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 Intimal thickening and calcific atherosclerosis resulting in severe stenosis RIGHT subclavian artery origin. RIGHT CAROTID SYSTEM: Common carotid artery is widely patent, mild eccentric intimal thickening. Eccentric intimal thickening calcific atherosclerosis results in less than 5 mm segment of 60% stenosis. Patent internal carotid artery. LEFT CAROTID  SYSTEM: Gradual loss of contrast opacification LEFT Common carotid artery. Intimal thickening. Poor contrast opacification LEFT carotid bifurcation. Severe calcific atherosclerosis, occluded LEFT internal carotid artery origin, no appreciable contrast opacification LEFT internal carotid artery. VERTEBRAL ARTERIES:Severe stenosis bilateral vertebral artery origins. Codominant vertebral artery's. SKELETON: No acute osseous process though bone windows have not been submitted. Moderate to severe cervical spondylosis and moderate facet arthropathy. Severe bilateral C3-4, RIGHT C4-5, bilateral C5-6 neural foraminal narrowing. OTHER NECK: Soft tissues of the neck are nonacute though, not tailored for evaluation. UPPER CHEST: Small pleural effusions. No superior mediastinal lymphadenopathy. IMPRESSION: 1. Occluded LEFT internal carotid artery. 2. 60% stenosis RIGHT internal carotid artery origin. 3. Severe stenosis RIGHT subclavian artery origin. Severe stenosis bilateral vertebral artery origins. 4. Multilevel severe neural foraminal narrowing. 5. **An incidental finding of potential clinical significance has been found. 4.7 cm ascending aortic aneurysm. Recommend semi-annual imaging followup by CTA or MRA and referral to cardiothoracic surgery if not already obtained. This recommendation follows 2010 ACCF/AHA/AATS/ACR/ASA/SCA/SCAI/SIR/STS/SVM Guidelines for the Diagnosis and Management of Patients With Thoracic Aortic Disease. Circulation. 2010; 121: A630-Z601** 6. These results will be called to the ordering clinician or representative by the Radiologist Assistant, and communication documented in the PACS or zVision Dashboard. Aortic Atherosclerosis (ICD10-I70.0). Electronically Signed   By: Elon Alas M.D.   On: 12/06/2016 19:09   I reviewed this patient CTA Neck.  While the time is sub-optimal, the L ICA appears to be chronically occluded with any evidence of retrograde flow.  The R carotid system appears  adequately perfuse with multiple segments with <50% disease.  - No indication for L CEA given chronic L ICA occlusion - Patient can follow up in the office once he has recovered from his CABG  Adele Barthel, MD, [facility] [place]: [phone] Pager: [phone]  12/06/2016, 7:29 PM

## 2016-12-06 NOTE — Progress Notes (Signed)
CARDIAC REHAB PHASE I   PRE:  Rate/Rhythm: 66 SR  BP:  Sitting: 128/67        SaO2: 99 RA  MODE:  Ambulation: 190 ft   POST:  Rate/Rhythm: 78 SR  BP:  Sitting: 131/72         SaO2: 98 RA  Pt in bed, agreeable to walk, Pt ambulated 190 ft on RA, IV, assist x1, slow, mostly steady gait, tolerated fairly well. Pt c/o some mild DOE, fatigue with distance, standing rest x1. Pt to recliner after walk, call bell within reach. Will follow post-op.   [phone] Lenna Sciara, RN, BSN 12/06/2016 2:48 PM

## 2016-12-06 NOTE — Anesthesia Preprocedure Evaluation (Addendum)
Anesthesia Evaluation  Patient identified by MRN, date of birth, ID band Patient awake    Reviewed: Allergy & Precautions, NPO status , Patient's Chart, lab work & pertinent test results  Airway Mallampati: IV  TM Distance: >3 FB Neck ROM: Full    Dental  (+) Dental Advisory Given, Poor Dentition   Pulmonary neg pulmonary ROS,    Pulmonary exam normal breath sounds clear to auscultation       Cardiovascular + CAD, + Past MI and + Peripheral Vascular Disease  Normal cardiovascular exam Rhythm:Regular Rate:Normal  Carotid US - 60-79% right ICAS, 80-99% left ICAS  TTE - Mild LVH. The estimated ejection   fraction was 40%. Diffuse hypokinesis. Grade 2 diastolic dysfunction. - Aortic valve: Moderate regurgitation. - Aorta: Dilated ascending aorta. Ascending aortic diameter: 45 mm  (S). - Mitral valve: There was mild regurgitation. - Left atrium: The atrium was mildly dilated. - Right ventricle: The cavity size was normal. Systolic function  was normal. - Tricuspid valve: Peak RV-RA gradient (S): 46 mm Hg. - Pulmonary arteries: PA peak pressure: 49 mm Hg (S).  Cath - LAD 80% stenosis, Left Circ 80%, RCA 95%, Right PDA 90%     Neuro/Psych negative neurological ROS  negative psych ROS   GI/Hepatic Neg liver ROS, hiatal hernia,   Endo/Other  negative endocrine ROS  Renal/GU negative Renal ROS  negative genitourinary   Musculoskeletal  (+) Arthritis , Osteoarthritis,    Abdominal   Peds  Hematology  (+) anemia ,   Anesthesia Other Findings Prostate Cancer  Reproductive/Obstetrics                            Anesthesia Physical Anesthesia Plan  ASA: V  Anesthesia Plan: General   Post-op Pain Management:    Induction: Intravenous  PONV Risk Score and Plan: 3 and Midazolam and Treatment may vary due to age or medical condition  Airway Management Planned: Oral ETT  Additional Equipment:  Arterial line, CVP, PA Cath, TEE and Ultrasound Guidance Line Placement  Intra-op Plan:   Post-operative Plan: Post-operative intubation/ventilation  Informed Consent: I have reviewed the patients History and Physical, chart, labs and discussed the procedure including the risks, benefits and alternatives for the proposed anesthesia with the patient or authorized representative who has indicated his/her understanding and acceptance.   Dental advisory given  Plan Discussed with: CRNA  Anesthesia Plan Comments:         Anesthesia Quick Evaluation

## 2016-12-06 NOTE — Progress Notes (Signed)
  Echocardiogram 2D Echocardiogram has been performed.  Bobbye Charleston 12/06/2016, 8:19 AM

## 2016-12-07 ENCOUNTER — Encounter (HOSPITAL_COMMUNITY): Payer: Self-pay | Admitting: Certified Registered"

## 2016-12-07 ENCOUNTER — Inpatient Hospital Stay (HOSPITAL_COMMUNITY)
Admission: AD | Disposition: A | Discharge status: Home Health | Payer: Self-pay | Source: Other Acute Inpatient Hospital | Attending: Thoracic Surgery (Cardiothoracic Vascular Surgery)

## 2016-12-07 ENCOUNTER — Inpatient Hospital Stay (HOSPITAL_COMMUNITY): Payer: Medicare Other | Admitting: Certified Registered"

## 2016-12-07 ENCOUNTER — Inpatient Hospital Stay (HOSPITAL_COMMUNITY): Payer: Medicare Other

## 2016-12-07 DIAGNOSIS — I351 Nonrheumatic aortic (valve) insufficiency: Secondary | ICD-10-CM | POA: Diagnosis present

## 2016-12-07 DIAGNOSIS — Z952 Presence of prosthetic heart valve: Secondary | ICD-10-CM

## 2016-12-07 DIAGNOSIS — Z953 Presence of xenogenic heart valve: Secondary | ICD-10-CM

## 2016-12-07 DIAGNOSIS — I6522 Occlusion and stenosis of left carotid artery: Secondary | ICD-10-CM | POA: Diagnosis present

## 2016-12-07 DIAGNOSIS — I25118 Atherosclerotic heart disease of native coronary artery with other forms of angina pectoris: Secondary | ICD-10-CM | POA: Diagnosis present

## 2016-12-07 DIAGNOSIS — I712 Thoracic aortic aneurysm, without rupture: Secondary | ICD-10-CM

## 2016-12-07 DIAGNOSIS — I6521 Occlusion and stenosis of right carotid artery: Secondary | ICD-10-CM | POA: Diagnosis present

## 2016-12-07 DIAGNOSIS — I2510 Atherosclerotic heart disease of native coronary artery without angina pectoris: Secondary | ICD-10-CM

## 2016-12-07 DIAGNOSIS — Z8679 Personal history of other diseases of the circulatory system: Secondary | ICD-10-CM

## 2016-12-07 DIAGNOSIS — Z9889 Other specified postprocedural states: Secondary | ICD-10-CM

## 2016-12-07 DIAGNOSIS — I771 Stricture of artery: Secondary | ICD-10-CM | POA: Diagnosis present

## 2016-12-07 DIAGNOSIS — Z951 Presence of aortocoronary bypass graft: Secondary | ICD-10-CM

## 2016-12-07 HISTORY — DX: Presence of xenogenic heart valve: Z95.3

## 2016-12-07 HISTORY — PX: BENTALL PROCEDURE: SHX5058

## 2016-12-07 HISTORY — DX: Personal history of other diseases of the circulatory system: Z86.79

## 2016-12-07 HISTORY — PX: CORONARY ARTERY BYPASS GRAFT: SHX141

## 2016-12-07 HISTORY — DX: Personal history of other diseases of the circulatory system: Z98.890

## 2016-12-07 HISTORY — PX: ENDOVEIN HARVEST OF GREATER SAPHENOUS VEIN: SHX5059

## 2016-12-07 HISTORY — PX: TEE WITHOUT CARDIOVERSION: SHX5443

## 2016-12-07 HISTORY — PX: THORACIC AORTIC ANEURYSM REPAIR: SHX799

## 2016-12-07 HISTORY — DX: Presence of aortocoronary bypass graft: Z95.1

## 2016-12-07 LAB — POCT I-STAT 3, ART BLOOD GAS (G3+)
ACID-BASE DEFICIT: 1 mmol/L (ref 0.0–2.0)
Acid-Base Excess: 2 mmol/L (ref 0.0–2.0)
Acid-Base Excess: 2 mmol/L (ref 0.0–2.0)
Acid-base deficit: 3 mmol/L — ABNORMAL HIGH (ref 0.0–2.0)
BICARBONATE: 25.4 mmol/L (ref 20.0–28.0)
BICARBONATE: 20.6 mmol/L (ref 20.0–28.0)
BICARBONATE: 23.1 mmol/L (ref 20.0–28.0)
Bicarbonate: 25.7 mmol/L (ref 20.0–28.0)
Bicarbonate: 23.3 mmol/L (ref 20.0–28.0)
O2 SAT: 99 %
O2 SAT: 97 %
O2 Saturation: 100 %
O2 Saturation: 100 %
O2 Saturation: 93 %
PCO2 ART: 28.5 mmHg — AB (ref 32.0–48.0)
PO2 ART: 225 mmHg — AB (ref 83.0–108.0)
PO2 ART: 57 mmHg — AB (ref 83.0–108.0)
PO2 ART: 107 mmHg (ref 83.0–108.0)
PO2 ART: 84 mmHg (ref 83.0–108.0)
Patient temperature: 35.7
TCO2: 27 mmol/L (ref 22–32)
TCO2: 26 mmol/L (ref 22–32)
TCO2: 21 mmol/L — AB (ref 22–32)
TCO2: 24 mmol/L (ref 22–32)
TCO2: 24 mmol/L (ref 22–32)
pCO2 arterial: 33.1 mmHg (ref 32.0–48.0)
pCO2 arterial: 33.7 mmHg (ref 32.0–48.0)
pCO2 arterial: 30.4 mmHg — ABNORMAL LOW (ref 32.0–48.0)
pCO2 arterial: 35 mmHg (ref 32.0–48.0)
pH, Arterial: 7.499 — ABNORMAL HIGH (ref 7.350–7.450)
pH, Arterial: 7.484 — ABNORMAL HIGH (ref 7.350–7.450)
pH, Arterial: 7.462 — ABNORMAL HIGH (ref 7.350–7.450)
pH, Arterial: 7.487 — ABNORMAL HIGH (ref 7.350–7.450)
pH, Arterial: 7.43 (ref 7.350–7.450)
pO2, Arterial: 359 mmHg — ABNORMAL HIGH (ref 83.0–108.0)

## 2016-12-07 LAB — POCT I-STAT, CHEM 8
BUN: 15 mg/dL (ref 6–20)
BUN: 13 mg/dL (ref 6–20)
BUN: 15 mg/dL (ref 6–20)
BUN: 13 mg/dL (ref 6–20)
BUN: 14 mg/dL (ref 6–20)
BUN: 14 mg/dL (ref 6–20)
BUN: 13 mg/dL (ref 6–20)
BUN: 11 mg/dL (ref 6–20)
CALCIUM ION: 0.94 mmol/L — AB (ref 1.15–1.40)
CALCIUM ION: 1.02 mmol/L — AB (ref 1.15–1.40)
CALCIUM ION: 1.02 mmol/L — AB (ref 1.15–1.40)
CALCIUM ION: 1 mmol/L — AB (ref 1.15–1.40)
CALCIUM ION: 1.03 mmol/L — AB (ref 1.15–1.40)
CHLORIDE: 102 mmol/L (ref 101–111)
CHLORIDE: 106 mmol/L (ref 101–111)
CHLORIDE: 102 mmol/L (ref 101–111)
CHLORIDE: 106 mmol/L (ref 101–111)
CREATININE: 0.7 mg/dL (ref 0.61–1.24)
CREATININE: 0.7 mg/dL (ref 0.61–1.24)
CREATININE: 0.8 mg/dL (ref 0.61–1.24)
CREATININE: 0.8 mg/dL (ref 0.61–1.24)
Calcium, Ion: 1.19 mmol/L (ref 1.15–1.40)
Calcium, Ion: 1.15 mmol/L (ref 1.15–1.40)
Calcium, Ion: 0.9 mmol/L — ABNORMAL LOW (ref 1.15–1.40)
Chloride: 105 mmol/L (ref 101–111)
Chloride: 103 mmol/L (ref 101–111)
Chloride: 102 mmol/L (ref 101–111)
Chloride: 103 mmol/L (ref 101–111)
Creatinine, Ser: 0.9 mg/dL (ref 0.61–1.24)
Creatinine, Ser: 0.8 mg/dL (ref 0.61–1.24)
Creatinine, Ser: 0.7 mg/dL (ref 0.61–1.24)
Creatinine, Ser: 0.9 mg/dL (ref 0.61–1.24)
GLUCOSE: 89 mg/dL (ref 65–99)
GLUCOSE: 94 mg/dL (ref 65–99)
GLUCOSE: 96 mg/dL (ref 65–99)
GLUCOSE: 111 mg/dL — AB (ref 65–99)
GLUCOSE: 112 mg/dL — AB (ref 65–99)
GLUCOSE: 154 mg/dL — AB (ref 65–99)
Glucose, Bld: 89 mg/dL (ref 65–99)
Glucose, Bld: 117 mg/dL — ABNORMAL HIGH (ref 65–99)
HCT: 21 % — ABNORMAL LOW (ref 39.0–52.0)
HCT: 23 % — ABNORMAL LOW (ref 39.0–52.0)
HCT: 21 % — ABNORMAL LOW (ref 39.0–52.0)
HCT: 17 % — ABNORMAL LOW (ref 39.0–52.0)
HCT: 20 % — ABNORMAL LOW (ref 39.0–52.0)
HCT: 27 % — ABNORMAL LOW (ref 39.0–52.0)
HEMATOCRIT: 23 % — AB (ref 39.0–52.0)
HEMATOCRIT: 23 % — AB (ref 39.0–52.0)
HEMOGLOBIN: 7.1 g/dL — AB (ref 13.0–17.0)
HEMOGLOBIN: 7.8 g/dL — AB (ref 13.0–17.0)
Hemoglobin: 7.8 g/dL — ABNORMAL LOW (ref 13.0–17.0)
Hemoglobin: 7.1 g/dL — ABNORMAL LOW (ref 13.0–17.0)
Hemoglobin: 7.8 g/dL — ABNORMAL LOW (ref 13.0–17.0)
Hemoglobin: 5.8 g/dL — CL (ref 13.0–17.0)
Hemoglobin: 6.8 g/dL — CL (ref 13.0–17.0)
Hemoglobin: 9.2 g/dL — ABNORMAL LOW (ref 13.0–17.0)
POTASSIUM: 3.5 mmol/L (ref 3.5–5.1)
Potassium: 3.6 mmol/L (ref 3.5–5.1)
Potassium: 3.5 mmol/L (ref 3.5–5.1)
Potassium: 3.7 mmol/L (ref 3.5–5.1)
Potassium: 4 mmol/L (ref 3.5–5.1)
Potassium: 5 mmol/L (ref 3.5–5.1)
Potassium: 4 mmol/L (ref 3.5–5.1)
Potassium: 3.7 mmol/L (ref 3.5–5.1)
SODIUM: 140 mmol/L (ref 135–145)
SODIUM: 141 mmol/L (ref 135–145)
Sodium: 141 mmol/L (ref 135–145)
Sodium: 139 mmol/L (ref 135–145)
Sodium: 142 mmol/L (ref 135–145)
Sodium: 140 mmol/L (ref 135–145)
Sodium: 137 mmol/L (ref 135–145)
Sodium: 139 mmol/L (ref 135–145)
TCO2: 26 mmol/L (ref 22–32)
TCO2: 26 mmol/L (ref 22–32)
TCO2: 27 mmol/L (ref 22–32)
TCO2: 28 mmol/L (ref 22–32)
TCO2: 29 mmol/L (ref 22–32)
TCO2: 26 mmol/L (ref 22–32)
TCO2: 24 mmol/L (ref 22–32)
TCO2: 22 mmol/L (ref 22–32)

## 2016-12-07 LAB — CBC WITH DIFFERENTIAL/PLATELET
BASOS ABS: 0 10*3/uL (ref 0.0–0.1)
BASOS PCT: 0 %
EOS ABS: 0.1 10*3/uL (ref 0.0–0.7)
Eosinophils Relative: 1 %
HCT: 23.4 % — ABNORMAL LOW (ref 39.0–52.0)
HEMOGLOBIN: 7.8 g/dL — AB (ref 13.0–17.0)
Lymphocytes Relative: 11 %
Lymphs Abs: 0.9 10*3/uL (ref 0.7–4.0)
MCH: 28.9 pg (ref 26.0–34.0)
MCHC: 33.3 g/dL (ref 30.0–36.0)
MCV: 86.7 fL (ref 78.0–100.0)
Monocytes Absolute: 0.2 10*3/uL (ref 0.1–1.0)
Monocytes Relative: 3 %
NEUTROS PCT: 85 %
Neutro Abs: 7.4 10*3/uL (ref 1.7–7.7)
Platelets: 144 10*3/uL — ABNORMAL LOW (ref 150–400)
RBC: 2.7 MIL/uL — AB (ref 4.22–5.81)
RDW: 15 % (ref 11.5–15.5)
WBC: 8.7 10*3/uL (ref 4.0–10.5)

## 2016-12-07 LAB — CBC
HCT: 29.9 % — ABNORMAL LOW (ref 39.0–52.0)
HCT: 26.5 % — ABNORMAL LOW (ref 39.0–52.0)
HEMOGLOBIN: 10.3 g/dL — AB (ref 13.0–17.0)
Hemoglobin: 8.9 g/dL — ABNORMAL LOW (ref 13.0–17.0)
MCH: 30.2 pg (ref 26.0–34.0)
MCH: 29 pg (ref 26.0–34.0)
MCHC: 34.4 g/dL (ref 30.0–36.0)
MCHC: 33.6 g/dL (ref 30.0–36.0)
MCV: 87.7 fL (ref 78.0–100.0)
MCV: 86.3 fL (ref 78.0–100.0)
Platelets: 152 10*3/uL (ref 150–400)
Platelets: 134 10*3/uL — ABNORMAL LOW (ref 150–400)
RBC: 3.41 MIL/uL — AB (ref 4.22–5.81)
RBC: 3.07 MIL/uL — ABNORMAL LOW (ref 4.22–5.81)
RDW: 14.8 % (ref 11.5–15.5)
RDW: 14.5 % (ref 11.5–15.5)
WBC: 8.2 10*3/uL (ref 4.0–10.5)
WBC: 7.9 10*3/uL (ref 4.0–10.5)

## 2016-12-07 LAB — GLUCOSE, CAPILLARY
GLUCOSE-CAPILLARY: 125 mg/dL — AB (ref 65–99)
Glucose-Capillary: 124 mg/dL — ABNORMAL HIGH (ref 65–99)
Glucose-Capillary: 124 mg/dL — ABNORMAL HIGH (ref 65–99)
Glucose-Capillary: 128 mg/dL — ABNORMAL HIGH (ref 65–99)
Glucose-Capillary: 130 mg/dL — ABNORMAL HIGH (ref 65–99)
Glucose-Capillary: 137 mg/dL — ABNORMAL HIGH (ref 65–99)
Glucose-Capillary: 146 mg/dL — ABNORMAL HIGH (ref 65–99)

## 2016-12-07 LAB — PREPARE RBC (CROSSMATCH)

## 2016-12-07 LAB — POCT I-STAT 4, (NA,K, GLUC, HGB,HCT)
GLUCOSE: 123 mg/dL — AB (ref 65–99)
HCT: 26 % — ABNORMAL LOW (ref 39.0–52.0)
Hemoglobin: 8.8 g/dL — ABNORMAL LOW (ref 13.0–17.0)
Potassium: 3.6 mmol/L (ref 3.5–5.1)
Sodium: 142 mmol/L (ref 135–145)

## 2016-12-07 LAB — PROTIME-INR
INR: 1.56
INR: 1.44
PROTHROMBIN TIME: 18.5 s — AB (ref 11.4–15.2)
Prothrombin Time: 17.4 seconds — ABNORMAL HIGH (ref 11.4–15.2)

## 2016-12-07 LAB — HEMOGLOBIN AND HEMATOCRIT, BLOOD
HCT: 21.7 % — ABNORMAL LOW (ref 39.0–52.0)
Hemoglobin: 7.1 g/dL — ABNORMAL LOW (ref 13.0–17.0)

## 2016-12-07 LAB — APTT
APTT: 38 s — AB (ref 24–36)
aPTT: 38 seconds — ABNORMAL HIGH (ref 24–36)

## 2016-12-07 LAB — CREATININE, SERUM
Creatinine, Ser: 1.04 mg/dL (ref 0.61–1.24)
GFR calc Af Amer: >60 mL/min (ref >60)
GFR calc non Af Amer: >60 mL/min (ref >60)

## 2016-12-07 LAB — MAGNESIUM: Magnesium: 3.4 mg/dL — ABNORMAL HIGH (ref 1.7–2.4)

## 2016-12-07 LAB — PLATELET COUNT: Platelets: 103 10*3/uL — ABNORMAL LOW (ref 150–400)

## 2016-12-07 SURGERY — CORONARY ARTERY BYPASS GRAFTING (CABG)
Anesthesia: General | Site: Leg Upper

## 2016-12-07 MED ORDER — ALBUMIN HUMAN 5 % IV SOLN
250.0000 mL | INTRAVENOUS | Status: AC | PRN
  Administered 2016-12-07 – 2016-12-08 (×4): 250 mL via INTRAVENOUS
  Filled 2016-12-07 (×3): qty 250

## 2016-12-07 MED ORDER — SODIUM CHLORIDE 0.9 % IV SOLN
INTRAVENOUS | Status: DC
  Administered 2016-12-07 – 2016-12-08 (×2): via INTRAVENOUS

## 2016-12-07 MED ORDER — SODIUM CHLORIDE 0.9 % IV SOLN: Freq: Once | INTRAVENOUS | Status: DC

## 2016-12-07 MED ORDER — MIDAZOLAM HCL 10 MG/2ML IJ SOLN
INTRAMUSCULAR | Status: AC
  Filled 2016-12-07: qty 2

## 2016-12-07 MED ORDER — TRANEXAMIC ACID 1000 MG/10ML IV SOLN
INTRAVENOUS | Status: DC | PRN
  Administered 2016-12-07: 1.5 mg/kg/h via INTRAVENOUS

## 2016-12-07 MED ORDER — FENTANYL CITRATE (PF) 250 MCG/5ML IJ SOLN
INTRAMUSCULAR | Status: AC
  Filled 2016-12-07: qty 5

## 2016-12-07 MED ORDER — SUCCINYLCHOLINE CHLORIDE 200 MG/10ML IV SOSY
PREFILLED_SYRINGE | INTRAVENOUS | Status: AC
  Filled 2016-12-07: qty 10

## 2016-12-07 MED ORDER — ROCURONIUM BROMIDE 10 MG/ML (PF) SYRINGE
PREFILLED_SYRINGE | INTRAVENOUS | Status: AC
  Filled 2016-12-07: qty 5

## 2016-12-07 MED ORDER — PROPOFOL 10 MG/ML IV BOLUS
INTRAVENOUS | Status: AC
  Filled 2016-12-07: qty 20

## 2016-12-07 MED ORDER — SODIUM CHLORIDE 0.9% FLUSH: 3.0000 mL | INTRAVENOUS | Status: DC | PRN

## 2016-12-07 MED ORDER — PROPOFOL 10 MG/ML IV BOLUS
INTRAVENOUS | Status: DC | PRN
  Administered 2016-12-07: 40 mg via INTRAVENOUS

## 2016-12-07 MED ORDER — LACTATED RINGERS IV SOLN
INTRAVENOUS | Status: DC
  Administered 2016-12-07: 16:00:00 via INTRAVENOUS

## 2016-12-07 MED ORDER — CHLORHEXIDINE GLUCONATE 0.12 % MT SOLN
15.0000 mL | OROMUCOSAL | Status: AC
  Administered 2016-12-07: 15 mL via OROMUCOSAL

## 2016-12-07 MED ORDER — MILRINONE LACTATE IN DEXTROSE 20-5 MG/100ML-% IV SOLN
INTRAVENOUS | Status: DC | PRN
  Administered 2016-12-07: .3 mg/kg/min via INTRAVENOUS

## 2016-12-07 MED ORDER — ACETAMINOPHEN 160 MG/5ML PO SOLN: 650.0000 mg | Freq: Once | ORAL | Status: AC

## 2016-12-07 MED ORDER — LACTATED RINGERS IV SOLN: 500.0000 mL | Freq: Once | INTRAVENOUS | Status: DC | PRN

## 2016-12-07 MED ORDER — SODIUM CHLORIDE 0.9 % IV SOLN
INTRAVENOUS | Status: DC | PRN
  Administered 2016-12-07: .7 ug/kg/h via INTRAVENOUS

## 2016-12-07 MED ORDER — FENTANYL CITRATE (PF) 250 MCG/5ML IJ SOLN
INTRAMUSCULAR | Status: DC | PRN
  Administered 2016-12-07 (×3): 100 ug via INTRAVENOUS
  Administered 2016-12-07: 150 ug via INTRAVENOUS
  Administered 2016-12-07: 50 ug via INTRAVENOUS
  Administered 2016-12-07: 100 ug via INTRAVENOUS
  Administered 2016-12-07: 150 ug via INTRAVENOUS
  Administered 2016-12-07: 200 ug via INTRAVENOUS
  Administered 2016-12-07: 100 ug via INTRAVENOUS
  Administered 2016-12-07: 250 ug via INTRAVENOUS
  Administered 2016-12-07: 150 ug via INTRAVENOUS
  Administered 2016-12-07: 50 ug via INTRAVENOUS

## 2016-12-07 MED ORDER — LIDOCAINE 2% (20 MG/ML) 5 ML SYRINGE
INTRAMUSCULAR | Status: DC | PRN
  Administered 2016-12-07: 80 mg via INTRAVENOUS

## 2016-12-07 MED ORDER — HEPARIN SODIUM (PORCINE) 1000 UNIT/ML IJ SOLN
INTRAMUSCULAR | Status: DC | PRN
  Administered 2016-12-07: 22000 [IU] via INTRAVENOUS
  Administered 2016-12-07: 3000 [IU] via INTRAVENOUS

## 2016-12-07 MED ORDER — ORAL CARE MOUTH RINSE
15.0000 mL | Freq: Four times a day (QID) | OROMUCOSAL | Status: DC
  Administered 2016-12-07 – 2016-12-08 (×2): 15 mL via OROMUCOSAL

## 2016-12-07 MED ORDER — VANCOMYCIN HCL IN DEXTROSE 1-5 GM/200ML-% IV SOLN
1000.0000 mg | Freq: Once | INTRAVENOUS | Status: AC
  Administered 2016-12-07: 1000 mg via INTRAVENOUS
  Filled 2016-12-07: qty 200

## 2016-12-07 MED ORDER — LACTATED RINGERS IV SOLN
INTRAVENOUS | Status: DC | PRN
  Administered 2016-12-07 (×2): via INTRAVENOUS

## 2016-12-07 MED ORDER — EPINEPHRINE PF 1 MG/10ML IJ SOSY
PREFILLED_SYRINGE | INTRAMUSCULAR | Status: DC | PRN
  Administered 2016-12-07 (×2): .1 mg via INTRAVENOUS

## 2016-12-07 MED ORDER — LACTATED RINGERS IV SOLN: INTRAVENOUS | Status: DC

## 2016-12-07 MED ORDER — DEXTROSE 5 % IV SOLN
1.5000 g | Freq: Two times a day (BID) | INTRAVENOUS | Status: AC
  Administered 2016-12-07 – 2016-12-09 (×4): 1.5 g via INTRAVENOUS
  Filled 2016-12-07 (×4): qty 1.5

## 2016-12-07 MED ORDER — CHLORHEXIDINE GLUCONATE CLOTH 2 % EX PADS: 6.0000 | MEDICATED_PAD | Freq: Every day | CUTANEOUS | Status: DC

## 2016-12-07 MED ORDER — MIDAZOLAM HCL 5 MG/5ML IJ SOLN
INTRAMUSCULAR | Status: DC | PRN
  Administered 2016-12-07: 2 mg via INTRAVENOUS
  Administered 2016-12-07: 4 mg via INTRAVENOUS
  Administered 2016-12-07: 1 mg via INTRAVENOUS
  Administered 2016-12-07: 2 mg via INTRAVENOUS
  Administered 2016-12-07: 1 mg via INTRAVENOUS

## 2016-12-07 MED ORDER — MORPHINE SULFATE (PF) 4 MG/ML IV SOLN: 1.0000 mg | INTRAVENOUS | Status: DC | PRN

## 2016-12-07 MED ORDER — ONDANSETRON HCL 4 MG/2ML IJ SOLN
INTRAMUSCULAR | Status: AC
  Filled 2016-12-07: qty 2

## 2016-12-07 MED ORDER — PHENYLEPHRINE HCL 10 MG/ML IJ SOLN
INTRAMUSCULAR | Status: DC | PRN
  Administered 2016-12-07 (×2): 80 ug via INTRAVENOUS
  Administered 2016-12-07 (×3): 120 ug via INTRAVENOUS

## 2016-12-07 MED ORDER — CHLORHEXIDINE GLUCONATE 0.12% ORAL RINSE (MEDLINE KIT): 15.0000 mL | Freq: Two times a day (BID) | OROMUCOSAL | Status: DC

## 2016-12-07 MED ORDER — ROCURONIUM BROMIDE 100 MG/10ML IV SOLN
INTRAVENOUS | Status: DC | PRN
  Administered 2016-12-07: 100 mg via INTRAVENOUS
  Administered 2016-12-07: 20 mg via INTRAVENOUS
  Administered 2016-12-07: 50 mg via INTRAVENOUS
  Administered 2016-12-07: 30 mg via INTRAVENOUS

## 2016-12-07 MED ORDER — CHLORHEXIDINE GLUCONATE CLOTH 2 % EX PADS
6.0000 | MEDICATED_PAD | Freq: Every day | CUTANEOUS | Status: AC
  Administered 2016-12-07 – 2016-12-10 (×4): 6 via TOPICAL

## 2016-12-07 MED ORDER — COAGULATION FACTOR VIIA RECOMB 1 MG IV SOLR
45.0000 ug/kg | Freq: Once | INTRAVENOUS | Status: AC
  Administered 2016-12-07: 3000 ug via INTRAVENOUS
  Filled 2016-12-07: qty 2

## 2016-12-07 MED ORDER — MORPHINE SULFATE (PF) 2 MG/ML IV SOLN: 1.0000 mg | INTRAVENOUS | Status: DC | PRN

## 2016-12-07 MED ORDER — SODIUM CHLORIDE 0.9 % IV SOLN
0.0000 ug/min | INTRAVENOUS | Status: DC
  Administered 2016-12-08: 50 ug/min via INTRAVENOUS
  Administered 2016-12-08: 80 ug/min via INTRAVENOUS
  Filled 2016-12-07 (×2): qty 2

## 2016-12-07 MED ORDER — ARTIFICIAL TEARS OPHTHALMIC OINT
TOPICAL_OINTMENT | OPHTHALMIC | Status: DC | PRN
  Administered 2016-12-07: 1 via OPHTHALMIC

## 2016-12-07 MED ORDER — MIDAZOLAM HCL 2 MG/2ML IJ SOLN: 2.0000 mg | INTRAMUSCULAR | Status: DC | PRN

## 2016-12-07 MED ORDER — SODIUM CHLORIDE 0.45 % IV SOLN
INTRAVENOUS | Status: DC | PRN
  Administered 2016-12-07: 16:00:00 via INTRAVENOUS

## 2016-12-07 MED ORDER — METOPROLOL TARTRATE 5 MG/5ML IV SOLN: 2.5000 mg | INTRAVENOUS | Status: DC | PRN

## 2016-12-07 MED ORDER — MORPHINE SULFATE (PF) 4 MG/ML IV SOLN
1.0000 mg | INTRAVENOUS | Status: DC | PRN
  Administered 2016-12-07 (×2): 2 mg via INTRAVENOUS
  Administered 2016-12-08 – 2016-12-09 (×2): 4 mg via INTRAVENOUS
  Filled 2016-12-07 (×3): qty 1

## 2016-12-07 MED ORDER — INSULIN REGULAR BOLUS VIA INFUSION
0.0000 [IU] | Freq: Three times a day (TID) | INTRAVENOUS | Status: DC
  Filled 2016-12-07: qty 10

## 2016-12-07 MED ORDER — SODIUM CHLORIDE 0.9 % IV SOLN: 250.0000 mL | INTRAVENOUS | Status: DC

## 2016-12-07 MED ORDER — METOPROLOL TARTRATE 12.5 MG HALF TABLET
12.5000 mg | ORAL_TABLET | Freq: Two times a day (BID) | ORAL | Status: DC
  Administered 2016-12-09 – 2016-12-11 (×5): 12.5 mg via ORAL
  Filled 2016-12-07 (×5): qty 1

## 2016-12-07 MED ORDER — FAMOTIDINE IN NACL 20-0.9 MG/50ML-% IV SOLN
20.0000 mg | Freq: Two times a day (BID) | INTRAVENOUS | Status: AC
  Administered 2016-12-07 (×2): 20 mg via INTRAVENOUS
  Filled 2016-12-07: qty 50

## 2016-12-07 MED ORDER — BISACODYL 10 MG RE SUPP: 10.0000 mg | Freq: Every day | RECTAL | Status: DC

## 2016-12-07 MED ORDER — ALBUMIN HUMAN 5 % IV SOLN
INTRAVENOUS | Status: DC | PRN
  Administered 2016-12-07 (×2): via INTRAVENOUS

## 2016-12-07 MED ORDER — PANTOPRAZOLE SODIUM 40 MG PO TBEC: 40.0000 mg | DELAYED_RELEASE_TABLET | Freq: Every day | ORAL | Status: DC

## 2016-12-07 MED ORDER — PHENYLEPHRINE 40 MCG/ML (10ML) SYRINGE FOR IV PUSH (FOR BLOOD PRESSURE SUPPORT)
PREFILLED_SYRINGE | INTRAVENOUS | Status: AC
  Filled 2016-12-07: qty 10

## 2016-12-07 MED ORDER — DOCUSATE SODIUM 100 MG PO CAPS
200.0000 mg | ORAL_CAPSULE | Freq: Every day | ORAL | Status: DC
  Administered 2016-12-08 – 2016-12-13 (×6): 200 mg via ORAL
  Filled 2016-12-07 (×8): qty 2

## 2016-12-07 MED ORDER — PHENYLEPHRINE HCL 10 MG/ML IJ SOLN
INTRAVENOUS | Status: DC | PRN
  Administered 2016-12-07: 20 ug/min via INTRAVENOUS

## 2016-12-07 MED ORDER — ASPIRIN 81 MG PO CHEW: 324.0000 mg | CHEWABLE_TABLET | Freq: Every day | ORAL | Status: DC

## 2016-12-07 MED ORDER — OXYCODONE HCL 5 MG PO TABS
5.0000 mg | ORAL_TABLET | ORAL | Status: DC | PRN
  Administered 2016-12-08 – 2016-12-09 (×2): 5 mg via ORAL
  Filled 2016-12-07 (×2): qty 1

## 2016-12-07 MED ORDER — LIDOCAINE 2% (20 MG/ML) 5 ML SYRINGE
INTRAMUSCULAR | Status: AC
  Filled 2016-12-07: qty 5

## 2016-12-07 MED ORDER — PROTAMINE SULFATE 10 MG/ML IV SOLN
INTRAVENOUS | Status: DC | PRN
  Administered 2016-12-07: 20 mg via INTRAVENOUS
  Administered 2016-12-07: 105 mg via INTRAVENOUS
  Administered 2016-12-07: 125 mg via INTRAVENOUS

## 2016-12-07 MED ORDER — SODIUM CHLORIDE 0.9% FLUSH
3.0000 mL | Freq: Two times a day (BID) | INTRAVENOUS | Status: DC
  Administered 2016-12-08 – 2016-12-10 (×5): 3 mL via INTRAVENOUS
  Administered 2016-12-10: 10 mL via INTRAVENOUS
  Administered 2016-12-11 – 2016-12-16 (×7): 3 mL via INTRAVENOUS

## 2016-12-07 MED ORDER — ACETAMINOPHEN 650 MG RE SUPP
650.0000 mg | Freq: Once | RECTAL | Status: AC
  Administered 2016-12-07: 650 mg via RECTAL

## 2016-12-07 MED ORDER — POTASSIUM CHLORIDE 10 MEQ/50ML IV SOLN
10.0000 meq | INTRAVENOUS | Status: AC
  Administered 2016-12-07 – 2016-12-08 (×3): 10 meq via INTRAVENOUS
  Filled 2016-12-07 (×3): qty 50

## 2016-12-07 MED ORDER — POTASSIUM CHLORIDE 10 MEQ/50ML IV SOLN
10.0000 meq | INTRAVENOUS | Status: AC
  Administered 2016-12-07 (×3): 10 meq via INTRAVENOUS

## 2016-12-07 MED ORDER — SODIUM CHLORIDE 0.9 % IJ SOLN
OROMUCOSAL | Status: DC | PRN
  Administered 2016-12-07 (×3): 4 mL via TOPICAL

## 2016-12-07 MED ORDER — MILRINONE LACTATE IN DEXTROSE 20-5 MG/100ML-% IV SOLN
0.3000 ug/kg/min | INTRAVENOUS | Status: DC
  Administered 2016-12-07 – 2016-12-08 (×3): 0.3 ug/kg/min via INTRAVENOUS
  Filled 2016-12-07 (×3): qty 100

## 2016-12-07 MED ORDER — SODIUM BICARBONATE 8.4 % IV SOLN
50.0000 meq | Freq: Once | INTRAVENOUS | Status: AC
  Administered 2016-12-07: 50 meq via INTRAVENOUS

## 2016-12-07 MED ORDER — SODIUM CHLORIDE 0.9 % IV SOLN
INTRAVENOUS | Status: AC
  Administered 2016-12-07: 16:00:00 via INTRAVENOUS

## 2016-12-07 MED ORDER — DIPHENHYDRAMINE HCL 50 MG/ML IJ SOLN
INTRAMUSCULAR | Status: DC | PRN
  Administered 2016-12-07: 25 mg via INTRAVENOUS

## 2016-12-07 MED ORDER — ACETAMINOPHEN 160 MG/5ML PO SOLN
1000.0000 mg | Freq: Four times a day (QID) | ORAL | Status: DC
  Administered 2016-12-07: 1000 mg
  Filled 2016-12-07: qty 40.6

## 2016-12-07 MED ORDER — CHLORHEXIDINE GLUCONATE 0.12% ORAL RINSE (MEDLINE KIT)
15.0000 mL | Freq: Two times a day (BID) | OROMUCOSAL | Status: DC
  Administered 2016-12-07 – 2016-12-16 (×11): 15 mL via OROMUCOSAL

## 2016-12-07 MED ORDER — ASPIRIN EC 325 MG PO TBEC
325.0000 mg | DELAYED_RELEASE_TABLET | Freq: Every day | ORAL | Status: DC
  Administered 2016-12-08 – 2016-12-11 (×4): 325 mg via ORAL
  Filled 2016-12-07 (×4): qty 1

## 2016-12-07 MED ORDER — CALCIUM CHLORIDE 10 % IV SOLN
INTRAVENOUS | Status: DC | PRN
  Administered 2016-12-07 (×5): 100 mg via INTRAVENOUS

## 2016-12-07 MED ORDER — SODIUM CHLORIDE 0.9 % IV SOLN
INTRAVENOUS | Status: DC
  Filled 2016-12-07: qty 1

## 2016-12-07 MED ORDER — SODIUM CHLORIDE 0.9 % IV SOLN
0.0000 ug/kg/h | INTRAVENOUS | Status: DC
  Administered 2016-12-07: 0.7 ug/kg/h via INTRAVENOUS
  Filled 2016-12-07: qty 2

## 2016-12-07 MED ORDER — ONDANSETRON HCL 4 MG/2ML IJ SOLN: 4.0000 mg | Freq: Four times a day (QID) | INTRAMUSCULAR | Status: DC | PRN

## 2016-12-07 MED ORDER — MORPHINE SULFATE (PF) 4 MG/ML IV SOLN
1.0000 mg | INTRAVENOUS | Status: DC | PRN
  Administered 2016-12-08: 2 mg via INTRAVENOUS
  Filled 2016-12-07 (×2): qty 1

## 2016-12-07 MED ORDER — SODIUM CHLORIDE 0.9 % IV SOLN
INTRAVENOUS | Status: DC | PRN
  Administered 2016-12-07: .6 [IU]/h via INTRAVENOUS

## 2016-12-07 MED ORDER — MILRINONE LACTATE IN DEXTROSE 20-5 MG/100ML-% IV SOLN
0.1250 ug/kg/min | INTRAVENOUS | Status: DC
  Filled 2016-12-07: qty 100

## 2016-12-07 MED ORDER — LACTATED RINGERS IV SOLN
INTRAVENOUS | Status: DC | PRN
  Administered 2016-12-07 (×2): via INTRAVENOUS

## 2016-12-07 MED ORDER — BISACODYL 5 MG PO TBEC
10.0000 mg | DELAYED_RELEASE_TABLET | Freq: Every day | ORAL | Status: DC
  Administered 2016-12-08 – 2016-12-13 (×6): 10 mg via ORAL
  Filled 2016-12-07 (×9): qty 2

## 2016-12-07 MED ORDER — ORAL CARE MOUTH RINSE: 15.0000 mL | Freq: Four times a day (QID) | OROMUCOSAL | Status: DC

## 2016-12-07 MED ORDER — EPHEDRINE SULFATE 50 MG/ML IJ SOLN
INTRAMUSCULAR | Status: DC | PRN
  Administered 2016-12-07 (×3): 5 mg via INTRAVENOUS
  Administered 2016-12-07 (×5): 10 mg via INTRAVENOUS

## 2016-12-07 MED ORDER — HEMOSTATIC AGENTS (NO CHARGE) OPTIME
TOPICAL | Status: DC | PRN
  Administered 2016-12-07: 1 via TOPICAL

## 2016-12-07 MED ORDER — 0.9 % SODIUM CHLORIDE (POUR BTL) OPTIME
TOPICAL | Status: DC | PRN
  Administered 2016-12-07: 5000 mL

## 2016-12-07 MED ORDER — TRAMADOL HCL 50 MG PO TABS
50.0000 mg | ORAL_TABLET | ORAL | Status: DC | PRN
  Administered 2016-12-15: 100 mg via ORAL
  Filled 2016-12-07: qty 2

## 2016-12-07 MED ORDER — DOPAMINE-DEXTROSE 3.2-5 MG/ML-% IV SOLN
0.0000 ug/kg/min | INTRAVENOUS | Status: DC
  Administered 2016-12-10 – 2016-12-11 (×2): 3 ug/kg/min via INTRAVENOUS
  Filled 2016-12-07 (×2): qty 250

## 2016-12-07 MED ORDER — EPHEDRINE 5 MG/ML INJ
INTRAVENOUS | Status: AC
  Filled 2016-12-07: qty 10

## 2016-12-07 MED ORDER — LACTATED RINGERS IV SOLN
INTRAVENOUS | Status: DC | PRN
  Administered 2016-12-07: 07:00:00 via INTRAVENOUS

## 2016-12-07 MED ORDER — MUPIROCIN 2 % EX OINT
1.0000 "application " | TOPICAL_OINTMENT | Freq: Two times a day (BID) | CUTANEOUS | Status: AC
  Administered 2016-12-07 – 2016-12-12 (×10): 1 via NASAL
  Filled 2016-12-07: qty 22

## 2016-12-07 MED ORDER — SODIUM CHLORIDE 0.9% FLUSH: 10.0000 mL | INTRAVENOUS | Status: DC | PRN

## 2016-12-07 MED ORDER — SODIUM CHLORIDE 0.9 % IV SOLN: Freq: Once | INTRAVENOUS | Status: AC

## 2016-12-07 MED ORDER — NITROGLYCERIN IN D5W 200-5 MCG/ML-% IV SOLN: 0.0000 ug/min | INTRAVENOUS | Status: DC

## 2016-12-07 MED ORDER — METOPROLOL TARTRATE 25 MG/10 ML ORAL SUSPENSION
12.5000 mg | Freq: Two times a day (BID) | ORAL | Status: DC
  Administered 2016-12-07: 12.5 mg
  Filled 2016-12-07: qty 5

## 2016-12-07 MED ORDER — MAGNESIUM SULFATE 4 GM/100ML IV SOLN
4.0000 g | Freq: Once | INTRAVENOUS | Status: AC
  Administered 2016-12-07: 4 g via INTRAVENOUS
  Filled 2016-12-07: qty 100

## 2016-12-07 MED ORDER — ACETAMINOPHEN 500 MG PO TABS
1000.0000 mg | ORAL_TABLET | Freq: Four times a day (QID) | ORAL | Status: AC
  Administered 2016-12-08 – 2016-12-12 (×15): 1000 mg via ORAL
  Filled 2016-12-07 (×18): qty 2

## 2016-12-07 SURGICAL SUPPLY — 130 items
ADAPTER CARDIO PERF ANTE/RETRO (ADAPTER) ×4 IMPLANT
APPLICATOR TIP COSEAL (VASCULAR PRODUCTS) ×8 IMPLANT
BAG DECANTER FOR FLEXI CONT (MISCELLANEOUS) ×8 IMPLANT
BANDAGE ACE 4X5 VEL STRL LF (GAUZE/BANDAGES/DRESSINGS) ×8 IMPLANT
BANDAGE ACE 6X5 VEL STRL LF (GAUZE/BANDAGES/DRESSINGS) ×8 IMPLANT
BASKET HEART (ORDER IN 25'S) (MISCELLANEOUS) ×1
BASKET HEART (ORDER IN 25S) (MISCELLANEOUS) ×3 IMPLANT
BLADE CLIPPER SURG (BLADE) ×4 IMPLANT
BLADE STERNUM SYSTEM 6 (BLADE) ×4 IMPLANT
BLADE SURG 11 STRL SS (BLADE) ×4 IMPLANT
BNDG GAUZE ELAST 4 BULKY (GAUZE/BANDAGES/DRESSINGS) ×8 IMPLANT
CANISTER SUCT 3000ML PPV (MISCELLANEOUS) ×4 IMPLANT
CANNULA EZ GLIDE AORTIC 21FR (CANNULA) ×8 IMPLANT
CANNULA GUNDRY RCSP 15FR (MISCELLANEOUS) ×4 IMPLANT
CATH CPB KIT OWEN (MISCELLANEOUS) ×4 IMPLANT
CATH HEART VENT LEFT (CATHETERS) ×3 IMPLANT
CATH THORACIC 36FR (CATHETERS) ×4 IMPLANT
CAUTERY SURG HI TEMP FINE TIP (MISCELLANEOUS) ×4 IMPLANT
CLIP VESOCCLUDE MED 24/CT (CLIP) IMPLANT
CLIP VESOCCLUDE SM WIDE 24/CT (CLIP) IMPLANT
CONN ST 1/4X3/8  BEN (MISCELLANEOUS) ×3
CONN ST 1/4X3/8 BEN (MISCELLANEOUS) ×9 IMPLANT
CONT SPEC 4OZ CLIKSEAL STRL BL (MISCELLANEOUS) ×8 IMPLANT
CRADLE DONUT ADULT HEAD (MISCELLANEOUS) ×4 IMPLANT
DRAIN CHANNEL 32F RND 10.7 FF (WOUND CARE) ×12 IMPLANT
DRAPE CARDIOVASCULAR INCISE (DRAPES) ×1
DRAPE INCISE IOBAN 66X45 STRL (DRAPES) ×4 IMPLANT
DRAPE SLUSH/WARMER DISC (DRAPES) ×4 IMPLANT
DRAPE SRG 135X102X78XABS (DRAPES) ×3 IMPLANT
DRSG AQUACEL AG ADV 3.5X14 (GAUZE/BANDAGES/DRESSINGS) ×4 IMPLANT
DRSG COVADERM 4X14 (GAUZE/BANDAGES/DRESSINGS) ×4 IMPLANT
ELECT BLADE 4.0 EZ CLEAN MEGAD (MISCELLANEOUS) ×4
ELECT REM PT RETURN 9FT ADLT (ELECTROSURGICAL) ×8
ELECTRODE BLDE 4.0 EZ CLN MEGD (MISCELLANEOUS) ×3 IMPLANT
ELECTRODE REM PT RTRN 9FT ADLT (ELECTROSURGICAL) ×6 IMPLANT
FELT TEFLON 1X6 (MISCELLANEOUS) ×8 IMPLANT
GAUZE SPONGE 4X4 12PLY STRL (GAUZE/BANDAGES/DRESSINGS) ×8 IMPLANT
GAUZE SPONGE 4X4 12PLY STRL LF (GAUZE/BANDAGES/DRESSINGS) ×12 IMPLANT
GLOVE BIO SURGEON STRL SZ 6.5 (GLOVE) ×4 IMPLANT
GLOVE BIO SURGEON STRL SZ7.5 (GLOVE) ×4 IMPLANT
GLOVE BIOGEL PI IND STRL 6.5 (GLOVE) ×15 IMPLANT
GLOVE BIOGEL PI INDICATOR 6.5 (GLOVE) ×5
GLOVE ORTHO TXT STRL SZ7.5 (GLOVE) ×8 IMPLANT
GOWN STRL REUS W/ TWL LRG LVL3 (GOWN DISPOSABLE) ×12 IMPLANT
GOWN STRL REUS W/TWL LRG LVL3 (GOWN DISPOSABLE) ×4
GRAFT GELWEAVE VALSALVA 24CM (Prosthesis & Implant Heart) ×4 IMPLANT
GRAFT WOVEN D/V 30DX30L (Vascular Products) ×4 IMPLANT
HEMOSTAT POWDER SURGIFOAM 1G (HEMOSTASIS) ×12 IMPLANT
INSERT FOGARTY XLG (MISCELLANEOUS) ×4 IMPLANT
KIT BASIN OR (CUSTOM PROCEDURE TRAY) ×4 IMPLANT
KIT ROOM TURNOVER OR (KITS) ×4 IMPLANT
KIT SUCTION CATH 14FR (SUCTIONS) ×12 IMPLANT
KIT VASOVIEW HEMOPRO VH 3000 (KITS) ×4 IMPLANT
LEAD PACING MYOCARDI (MISCELLANEOUS) ×4 IMPLANT
LINE VENT (MISCELLANEOUS) ×4 IMPLANT
MARKER GRAFT CORONARY BYPASS (MISCELLANEOUS) ×12 IMPLANT
NS IRRIG 1000ML POUR BTL (IV SOLUTION) ×20 IMPLANT
PACK OPEN HEART (CUSTOM PROCEDURE TRAY) ×4 IMPLANT
PAD ARMBOARD 7.5X6 YLW CONV (MISCELLANEOUS) ×8 IMPLANT
PAD ELECT DEFIB RADIOL ZOLL (MISCELLANEOUS) ×4 IMPLANT
PENCIL BUTTON HOLSTER BLD 10FT (ELECTRODE) ×4 IMPLANT
PUNCH AORTIC ROTATE 4.0MM (MISCELLANEOUS) IMPLANT
PUNCH AORTIC ROTATE 4.5MM 8IN (MISCELLANEOUS) IMPLANT
PUNCH AORTIC ROTATE 5MM 8IN (MISCELLANEOUS) IMPLANT
SEALANT SURG COSEAL 8ML (VASCULAR PRODUCTS) ×4 IMPLANT
SET CARDIOPLEGIA MPS 5001102 (MISCELLANEOUS) ×4 IMPLANT
SOLUTION ANTI FOG 6CC (MISCELLANEOUS) ×8 IMPLANT
SPONGE LAP 18X18 X RAY DECT (DISPOSABLE) ×4 IMPLANT
SPONGE LAP 4X18 X RAY DECT (DISPOSABLE) ×4 IMPLANT
SUT BONE WAX W31G (SUTURE) ×8 IMPLANT
SUT ETHIBON 2 0 V 52N 30 (SUTURE) ×8 IMPLANT
SUT ETHIBON EXCEL 2-0 V-5 (SUTURE) ×4 IMPLANT
SUT ETHIBOND 2 0 SH (SUTURE) ×4
SUT ETHIBOND 2 0 SH 36X2 (SUTURE) ×12 IMPLANT
SUT ETHIBOND 3 0 SH 1 (SUTURE) ×4 IMPLANT
SUT ETHIBOND X763 2 0 SH 1 (SUTURE) ×8 IMPLANT
SUT MNCRL AB 3-0 PS2 18 (SUTURE) ×8 IMPLANT
SUT MNCRL AB 4-0 PS2 18 (SUTURE) ×8 IMPLANT
SUT PDS AB 1 CTX 36 (SUTURE) ×8 IMPLANT
SUT PROLENE 2 0 SH DA (SUTURE) IMPLANT
SUT PROLENE 3 0 SH DA (SUTURE) ×12 IMPLANT
SUT PROLENE 3 0 SH1 36 (SUTURE) ×20 IMPLANT
SUT PROLENE 4 0 RB 1 (SUTURE) ×12
SUT PROLENE 4 0 SH DA (SUTURE) ×4 IMPLANT
SUT PROLENE 4-0 RB1 .5 CRCL 36 (SUTURE) ×36 IMPLANT
SUT PROLENE 5 0 C 1 36 (SUTURE) ×16 IMPLANT
SUT PROLENE 6 0 C 1 30 (SUTURE) ×20 IMPLANT
SUT PROLENE 7.0 RB 3 (SUTURE) ×12 IMPLANT
SUT PROLENE 8 0 BV175 6 (SUTURE) ×8 IMPLANT
SUT PROLENE BLUE 7 0 (SUTURE) ×4 IMPLANT
SUT PROLENE POLY MONO (SUTURE) IMPLANT
SUT SILK  1 MH (SUTURE) ×5
SUT SILK 1 MH (SUTURE) ×15 IMPLANT
SUT SILK 1 TIES 10X30 (SUTURE) ×4 IMPLANT
SUT SILK 2 0 SH CR/8 (SUTURE) ×8 IMPLANT
SUT SILK 2 0 TIES 10X30 (SUTURE) ×4 IMPLANT
SUT SILK 2 0 TIES 17X18 (SUTURE) ×2
SUT SILK 2-0 18XBRD TIE BLK (SUTURE) ×6 IMPLANT
SUT SILK 3 0 SH CR/8 (SUTURE) ×4 IMPLANT
SUT SILK 4 0 TIE 10X30 (SUTURE) ×4 IMPLANT
SUT STEEL 6MS V (SUTURE) ×4 IMPLANT
SUT STEEL STERNAL CCS#1 18IN (SUTURE) IMPLANT
SUT STEEL SZ 6 DBL 3X14 BALL (SUTURE) ×8 IMPLANT
SUT TEM PAC WIRE 2 0 SH (SUTURE) ×16 IMPLANT
SUT VIC AB 1 CTX 36 (SUTURE) ×1
SUT VIC AB 1 CTX36XBRD ANBCTR (SUTURE) ×3 IMPLANT
SUT VIC AB 2-0 CT1 27 (SUTURE) ×1
SUT VIC AB 2-0 CT1 TAPERPNT 27 (SUTURE) ×3 IMPLANT
SUT VIC AB 2-0 CTX 27 (SUTURE) ×8 IMPLANT
SUT VIC AB 3-0 SH 27 (SUTURE)
SUT VIC AB 3-0 SH 27X BRD (SUTURE) IMPLANT
SUT VIC AB 3-0 X1 27 (SUTURE) ×8 IMPLANT
SUT VICRYL 2 TP 1 (SUTURE) ×4 IMPLANT
SUT VICRYL 4-0 PS2 18IN ABS (SUTURE) IMPLANT
SUTURE E-PAK OPEN HEART (SUTURE) ×4 IMPLANT
SYSTEM SAHARA CHEST DRAIN ATS (WOUND CARE) ×4 IMPLANT
TAPE CLOTH SURG 4X10 WHT LF (GAUZE/BANDAGES/DRESSINGS) ×4 IMPLANT
TAPE PAPER 3X10 WHT MICROPORE (GAUZE/BANDAGES/DRESSINGS) ×4 IMPLANT
TOWEL GREEN STERILE (TOWEL DISPOSABLE) ×16 IMPLANT
TOWEL GREEN STERILE FF (TOWEL DISPOSABLE) ×8 IMPLANT
TOWEL OR 17X24 6PK STRL BLUE (TOWEL DISPOSABLE) ×8 IMPLANT
TOWEL OR 17X26 10 PK STRL BLUE (TOWEL DISPOSABLE) ×8 IMPLANT
TRAY FOLEY SILVER 16FR TEMP (SET/KITS/TRAYS/PACK) ×4 IMPLANT
TUBING BULK SUCTION (MISCELLANEOUS) ×4 IMPLANT
TUBING INSUFFLATION (TUBING) ×4 IMPLANT
UNDERPAD 30X30 (UNDERPADS AND DIAPERS) ×4 IMPLANT
VALVE MAGNA EASE 21MM (Prosthesis & Implant Heart) ×4 IMPLANT
VENT LEFT HEART 12002 (CATHETERS) ×4
WATER STERILE IRR 1000ML POUR (IV SOLUTION) ×8 IMPLANT
YANKAUER SUCT BULB TIP NO VENT (SUCTIONS) ×4 IMPLANT

## 2016-12-07 NOTE — Anesthesia Procedure Notes (Signed)
Procedure Name: Intubation Date/Time: 12/07/2016 7:58 AM Performed by: Orlie Dakin Pre-anesthesia Checklist: Patient identified, Emergency Drugs available, Suction available, Patient being monitored and Timeout performed Patient Re-evaluated:Patient Re-evaluated prior to induction Oxygen Delivery Method: Circle system utilized Preoxygenation: Pre-oxygenation with 100% oxygen Induction Type: IV induction Ventilation: Mask ventilation without difficulty Laryngoscope Size: Miller and 3 Grade View: Grade I Tube type: Subglottic suction tube Tube size: 8.0 mm Number of attempts: 1 Airway Equipment and Method: Stylet Placement Confirmation: ETT inserted through vocal cords under direct vision,  positive ETCO2 and breath sounds checked- equal and bilateral Secured at: 24 cm Tube secured with: Tape Dental Injury: Teeth and Oropharynx as per pre-operative assessment  Comments: TEE probe bite block placed prior to TEE placement per Dr Fransisco Beau.

## 2016-12-07 NOTE — Anesthesia Procedure Notes (Signed)
Central Venous Catheter Insertion Performed by: Audry Pili, anesthesiologist Start/End9/28/2018 6:49 AM, 12/07/2016 7:06 AM Patient location: Pre-op. Preanesthetic checklist: patient identified, IV checked, site marked, risks and benefits discussed, surgical consent, monitors and equipment checked, pre-op evaluation, timeout performed and anesthesia consent Position: Trendelenburg Lidocaine 1% used for infiltration and patient sedated Hand hygiene performed , maximum sterile barriers used  and Seldinger technique used Catheter size: 8.5 Fr Total catheter length 10. Central line was placed.Sheath introducer Swan type:thermodilution PA Cath depth:47 Procedure performed using ultrasound guided technique. Ultrasound Notes:anatomy identified, needle tip was noted to be adjacent to the nerve/plexus identified, no ultrasound evidence of intravascular and/or intraneural injection and image(s) printed for medical record Attempts: 1 Following insertion, line sutured and dressing applied. Post procedure assessment: blood return through all ports, free fluid flow and no air  Patient tolerated the procedure well with no immediate complications.

## 2016-12-07 NOTE — Op Note (Signed)
CARDIOTHORACIC SURGERY OPERATIVE NOTE  Date of Procedure:  12/07/2016  Preoperative Diagnosis:   Severe 3-vessel Coronary Artery Disease  S/P Acute Non-ST Segment Elevation Myocardial Infarction  Ascending Thoracic Aortic Aneurysm  Moderate Aortic Insufficiency  Postoperative Diagnosis: Same  Procedure:    Biological Bentall Aortic Root Replacement  Edwards Magna Ease Pericardial Tissue Valve (size 98mm, model # 3300TFX, serial # [id number])  Gelweave Valsalva Aortic Root Graft (size 24 mm, serial # [id number])  Reimplantation of Left Main Coronary Artery   Repair of Ascending Thoracic Aortic Aneurysm  Hemashield Platinum Straignt Graft (size 30 mm, serial # [id number])   Coronary Artery Bypass Grafting x 2  Left Internal Mammary Artery to Second Diagonal Branch of Left Anterior Descending Coronary Artery  Saphenous Vein Graft to Posterior Descending Coronary Artery  Endoscopic Vein Harvest from Left Thigh  Surgeon: Valentina Gu. Roxy Manns, MD  Assistants: Nicholes Rough, PA-C and Otho Ket, PA-S  Anesthesia: Renold Don, MD  Operative Findings:  Ascending thoracic aortic aneurysm w/ maximum diameter 4.8 cm  Trileaflet aortic valve with moderate aortic insufficiency  Moderate global LV systolic dysfunction  Good quality LIMA conduit for grafting  Fair quality SVG conduit for grafting  Bifid distal LAD with intramyocardial terminal medial branch         BRIEF CLINICAL NOTE AND INDICATIONS FOR SURGERY  Patient is a 76 year old male with no previous history of coronary artery disease and risk factors notable primarily for that of hyperlipidemia who was admitted to [facility] 12/03/2016 with acute systolic congestive heart failure and non-ST segment elevation myocardial infarction and was transferred to [facility] for surgical consultation to discuss treatment options for management of severe three-vessel coronary artery disease.  The patient has been fairly healthy for most of his life and physically active. He notes that over the last several weeks or more he has developed exertional shortness of breath and occasional episodes of epigastric chest pain which typically were postprandial and attributed to possible dyspepsia. On 12/03/2016 he developed severe epigastric chest pain associated with shortness of breath after eating supper.  Symptoms persisted for more than 2 hours prompting the patient to be seen emergently at [facility] where the patient was notably hypertensive and chest x-ray revealed pulmonary edema.  Troponin levels were mildly elevated at 0.065. He was treated with sublingual nitroglycerin and symptoms probably resolved. He was transferred to [facility] and follow-up troponin levels rose further to 0.59. The patient subsequently underwent diagnostic cardiac catheterization by Dr. Saunders Revel and was found to have severe three-vessel coronary artery disease with preserved left ventricular systolic function. The patient was transferred to [facility] for further management.  Preoperative transthoracic echocardiogram revealed moderate left ventricular systolic dysfunction with ejection fraction estimated 40%. There was moderate aortic insufficiency. The aorta was dilated. 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 The patient was seen in consultation and Dr. Bridgett Larsson from the vascular surgery service and CT angiogram of the neck was performed. This revealed chronic occlusion of the left internal carotid artery, 60% stenosis of the right carotid artery at its origin, and high-grade right subclavian stenosis. In addition, there was an ascending thoracic aortic aneurysm.  The patient has been seen in consultation and counseled at length regarding the indications, risks and potential benefits of surgery.  All questions have been answered, and the patient  provides full informed consent for the operation as described.    DETAILS OF THE OPERATIVE  PROCEDURE  Preparation:  The patient is brought to the operating room on the above mentioned date and central monitoring was established by the anesthesia team including placement of Swan-Ganz catheter and radial arterial line. The patient is placed in the supine position on the operating table.  Intravenous antibiotics are administered. General endotracheal anesthesia is induced uneventfully. A Foley catheter is placed.  Baseline transesophageal echocardiogram was performed.  Findings were notable for moderate global left ventricular systolic dysfunction with ejection fraction estimated 40%. There were no significant wall motion abnormalities. The aortic valve was trileaflet. There was moderate central aortic insufficiency. The aortic root and ascending thoracic aorta was dilated to nearly 5 cm. There was trace central mitral regurgitation. No other abnormalities were noted.  The patient's chest, abdomen, both groins, and both lower extremities are prepared and draped in a sterile manner. A time out procedure is performed.   Surgical Approach and Conduit Harvest:  A median sternotomy incision was performed and the left internal mammary artery is dissected from the chest wall and prepared for bypass grafting. The left internal mammary artery is notably good quality conduit. Simultaneously, greater saphenous vein is obtained from the patient's left thigh using endoscopic vein harvest technique. Initially the saphenous vein is explored on the right thigh but the vein is felt to be too small. The left greater saphenous vein is notably also relatively small caliber and fair quality conduit. After removal of the saphenous vein, the small surgical incisions in the lower extremity are closed with absorbable suture. Following systemic heparinization, the left internal mammary artery was transected distally noted to  have excellent flow.   Extracorporeal Cardiopulmonary Bypass and Myocardial Protection:  The pericardium is opened. The ascending aorta aneurysmal. There are moderate adhesions along its edge obliterating the pericardial space. The maximum diameter of the aorta is greater than 4.5 and close to 5 cm.  The aorta tapers to more normal caliber at the level of the innominate artery. The transverse aortic arch and the right atrium are cannulated for cardioplegia bypass.  Adequate heparinization is verified.    A retrograde cardioplegia cannula is placed through the right atrium into the coronary sinus.  The operative field was continuously flooded with carbon dioxide gas.  The entire pre-bypass portion of the operation was notable for stable hemodynamics.  Cardiopulmonary bypass was begun and a left ventricular vent placed through the right superior pulmonary vein.  The surface of the heart inspected. Distal target vessels are selected for coronary artery bypass grafting. The terminal branch of the left circumflex coronary artery is too small for grafting. The distal left anterior descending coronary artery is a bifid system with the medial branch small and intramyocardial. The second diagonal branch is a dominant vessel on the anterior wall.   A cardioplegia cannula is placed in the ascending aorta.  A temperature probe was placed in the interventricular septum.  The patient is cooled to 28C systemic temperature.  The aortic cross clamp is applied and cardioplegia is delivered initially in an antegrade fashion through the aortic root using modified [place] cold blood cardioplegia ([place]estone blood cardioplegia protocol).   The initial cardioplegic arrest is rapid with early diastolic arrest.  Repeat doses of cardioplegia are administered at 90 minutes and every 30 minutes thereafter through the coronary sinus catheter and through subsequently placed vein graft in order to maintain completely flat  electrocardiogram.  Myocardial protection was felt to be excellent.   Coronary Artery Bypass Grafting:   The posterior descending  branch of the right coronary artery was grafted using a reversed saphenous vein graft in an end-to-side fashion.  At the site of distal anastomosis the target vessel was good quality and measured approximately 1.5 mm in diameter.  The second diagonal branch of the left anterior coronary artery was grafted with the left internal mammary artery in an end-to-side fashion.  At the site of distal anastomosis the target vessel was good quality and measured approximately 1.8 mm in diameter.   Bentall Aortic Root Replacement and Repair of Ascending Thoracic Aortic Aneurysm:  The aortic aneurysm was resected from just below the aortic cross-clamp to the level of the normal location of the sinotubular junction.  Aortic valve is inspected. It is notably trileaflet with moderate thickening of the free margin of the valve. The aortic valve leaflets are excised.  The aortic annulus is sized to accept a 21 mm stented bioprosthetic tissue valve.  Aortic root replacement was performed using interrupted horizontal mattress 2-0 Ethibond pledgeted sutures with pledgets in the supraannular position for the proximal suture line.  An [facility] Ease pericardial tissue valve (size 21 mm, model # 3300TFX, serial # [id number]) was implanted inside the proximal end of a Gelweave Valsalva synthetic graft (size 24 mm, serial #[id number]) after trimming the first few rings of the straight portion of the graft.  The left main coronary artery was subsequently reimplanted onto the Valsalva portion of the aortic root graft using running 5-0 Prolene suture after creating a large circular defect in the graft with thermal cautery. The right coronary artery was not reimplanted because of subtotal ostial occlusion with heavy calcification. The distal end of the aortic root graft could not reach all of the way to  the distal ascending thoracic aorta because of the length and angulation. Subsequently an interposition segment straight graft (Hemashield platinum size 30 mm) was sewn end-to-end to the distal ascending thoracic aorta using running 4-0 Prolene suture with Teflon felt strips to buttress the suture line. The proximal end of the Hemashield graft was sewn to the distal end of the aortic root graft after trimming and beveling both to an appropriate length.   Procedure Completion:  The proximal vein graft anastomosis was placed directly to the ascending aortic graft to removal of the aortic cross clamp.  The septal myocardial temperature rose rapidly after reperfusion of the left internal mammary artery graft.  One final dose of warm retrograde "reanimation dose" cardioplegia was administered through the coronary sinus catheter while all air was evacuated through the aortic root.  The aortic cross clamp was removed after a total cross clamp time of 169 minutes.  All proximal and distal coronary anastomoses were inspected for hemostasis and appropriate graft orientation. The aortic graft was carefully inspected for hemostasis. Epicardial pacing wires are fixed to the right ventricular outflow tract and to the right atrial appendage. The patient is rewarmed to 37C temperature. The left ventricular vent was removed.  The patient is weaned and disconnected from cardiopulmonary bypass.  The patient's rhythm at separation from bypass was sinus bradycardia.  AV pacing was utilized.  The patient was weaned from cardiopulmonary bypass on low dose milrinone and dopamine. Total cardiopulmonary bypass time for the operation was 200 minutes.  Followup transesophageal echocardiogram performed after separation from bypass revealed a well-seated bioprosthetic tissue valve in the aortic position that was functioning normally.  There was no aortic insufficiency.  There were otherwise no changes from the preoperative exam.  The  aortic and venous cannula  were removed uneventfully. Protamine was administered to reverse the anticoagulation. The mediastinum and pleural space were inspected for hemostasis and irrigated with saline solution. The mediastinum and both pleural spaces were drained using 4 chest tubes placed through separate stab incisions inferiorly.  The soft tissues anterior to the aorta were reapproximated loosely. The sternum is closed with double strength sternal wire. The soft tissues anterior to the sternum were closed in multiple layers and the skin is closed with a running subcuticular skin closure.  The patient received 4 units packed red blood cells during the procedure due to anemia which was present preoperatively and exacerbated by acute blood loss and hemodilution during cardiopulmonary bypass.  The patient received a total of 2 packs adult platelets and 2 units fresh frozen plasma due to coagulopathy and thrombocytopenia after separation from cardiopulmonary bypass and reversal of heparin with protamine.  The post-bypass portion of the operation was notable for stable rhythm and hemodynamics with exception for a brief period of severe hypotension which occurred during infusion of the second pack adult platelets. This required transient support with pressors but resolved within a few minutes. Repeat transesophageal echocardiogram revealed no changes.   Disposition:  The patient tolerated the procedure well and is transported to the surgical intensive care in stable condition. There are no intraoperative complications. All sponge instrument and needle counts are verified correct at completion of the operation.   Valentina Gu. Roxy Manns MD 12/07/2016 3:35 PM

## 2016-12-07 NOTE — Progress Notes (Signed)
Echocardiogram Echocardiogram Transesophageal has been performed.  Kevin Burgess 12/07/2016, 8:59 AM

## 2016-12-07 NOTE — Transfer of Care (Signed)
Immediate Anesthesia Transfer of Care Note  Patient: Kevin Burgess  Procedure(s) Performed: Procedure(s): CORONARY ARTERY BYPASS GRAFTING (CABG) X 2 (N/A) TRANSESOPHAGEAL ECHOCARDIOGRAM (TEE) (N/A) BIOLOGICAL BENTALL AORTIC ROOT REPLACEMENT (N/A) ENDOVEIN HARVEST OF GREATER SAPHENOUS VEIN (Left) THORACIC ASCENDING ANEURYSM REPAIR (AAA) (N/A)  Patient Location: SICU  Anesthesia Type:General  Level of Consciousness: Patient remains intubated per anesthesia plan  Airway & Oxygen Therapy: Patient remains intubated per anesthesia plan  Post-op Assessment: Report given to RN and Post -op Vital signs reviewed and stable  Post vital signs: Reviewed and stable  Last Vitals:  Vitals:   12/07/16 0520 12/07/16 0524  BP: 122/66 122/66  Pulse:  (!) 59  Resp: 16 17  Temp: 36.8 C   SpO2: 99% 99%    Last Pain:  Vitals:   12/07/16 0520  TempSrc: Oral  PainSc:          Complications: No apparent anesthesia complications

## 2016-12-07 NOTE — Anesthesia Procedure Notes (Addendum)
Arterial Line Insertion Start/End9/28/2018 6:45 AM, 12/07/2016 6:50 AM Performed by: Lavell Luster, CRNA  Patient location: Pre-op. Preanesthetic checklist: patient identified, IV checked, site marked, risks and benefits discussed, surgical consent, monitors and equipment checked, pre-op evaluation and timeout performed Lidocaine 1% used for infiltration Left, radial was placed Catheter size: 20 G Hand hygiene performed  and maximum sterile barriers used   Attempts: 1 Procedure performed without using ultrasound guided technique. Following insertion, Biopatch and dressing applied. Post procedure assessment: normal

## 2016-12-07 NOTE — Progress Notes (Signed)
ETT pulled back 2cm per Dr. Servando Snare with MD at bedside based on last CXR. Pt tolerated well, no changes in respiratory status. BIL lung sounds auscultated. Will continue to monitor.

## 2016-12-07 NOTE — Brief Op Note (Signed)
12/04/2016 - 12/07/2016  3:30 PM  PATIENT:  Kevin Burgess  76 y.o. male  PRE-OPERATIVE DIAGNOSIS:  CAD; Thoracic ascending aneurysm    POST-OPERATIVE DIAGNOSIS:  CAD; Thoracic ascending aneurysm  PROCEDURE:  Procedure(s): CORONARY ARTERY BYPASS GRAFTING (CABG) X 2 (N/A) TRANSESOPHAGEAL ECHOCARDIOGRAM (TEE) (N/A) BIOLOGICAL BENTALL AORTIC ROOT REPLACEMENT (N/A) ENDOVEIN HARVEST OF GREATER SAPHENOUS VEIN (Left) THORACIC ASCENDING ANEURYSM REPAIR (AAA) (N/A)   SURGEON:    Rexene Alberts, MD  ASSISTANTS:  Nicholes Rough, PA-C and Otho Ket, PA-S  ANESTHESIA:   Audry Pili, MD  CROSSCLAMP TIME:   74'  CARDIOPULMONARY BYPASS TIME: 200'  FINDINGS:  Ascending thoracic aortic aneurysm w/ maximum diameter 4.8 cm  Trileaflet aortic valve with moderate aortic insufficiency  Moderate global LV systolic dysfunction  Good quality LIMA conduit for grafting  Fair quality SVG conduit for grafting  Bifid distal LAD with intramyocardial terminal medial branch  COMPLICATIONS: None  BASELINE WEIGHT: 70 kg  PATIENT DISPOSITION:   TO SICU IN STABLE CONDITION  Rexene Alberts, MD 12/07/2016 3:30 PM

## 2016-12-07 NOTE — Progress Notes (Signed)
Patient ID: Kevin Burgess, male   DOB: 12/29/1940, 76 y.o.   MRN: [id number] EVENING ROUNDS NOTE :     [address]       [place] [address]             [phone]                 Day of Surgery Procedure(s) (LRB): CORONARY ARTERY BYPASS GRAFTING (CABG) X 2 (N/A) TRANSESOPHAGEAL ECHOCARDIOGRAM (TEE) (N/A) BIOLOGICAL BENTALL AORTIC ROOT REPLACEMENT (N/A) ENDOVEIN HARVEST OF GREATER SAPHENOUS VEIN (Left) THORACIC ASCENDING ANEURYSM REPAIR (AAA) (N/A)  Total Length of Stay:  LOS: 3 days  BP 101/65   Pulse 80   Temp 98.2 F (36.8 C)   Resp 12   Ht 5\' 7"  (1.702 m)   Wt 154 lb 1.6 oz (69.9 kg)   SpO2 100%   BMI 24.14 kg/m   .Intake/Output      09/28 0701 - 09/29 0700   I.V. (mL/kg) 3097.4 (44.3)   Blood 2134   IV Piggyback 1300   Total Intake(mL/kg) 6531.4 (93.4)   Urine (mL/kg/hr) 2205 (2)   Emesis/NG output 50   Blood 1200   Chest Tube 740   Total Output 4195   Net +2336.4         . sodium chloride 20 mL/hr at 12/07/16 2100  . sodium chloride 100 mL/hr at 12/07/16 2100  . [START ON 12/08/2016] sodium chloride    . sodium chloride 20 mL/hr at 12/07/16 2100  . albumin human Stopped (12/07/16 1900)  . cefUROXime (ZINACEF)  IV Stopped (12/07/16 2146)  . dexmedetomidine (PRECEDEX) IV infusion Stopped (12/07/16 2057)  . DOPamine 3 mcg/kg/min (12/07/16 2100)  . famotidine (PEPCID) IV 20 mg (12/07/16 2214)  . insulin (NOVOLIN-R) infusion 0.9 Units/hr (12/07/16 2200)  . lactated ringers    . lactated ringers 10 mL/hr at 12/07/16 1900  . lactated ringers 20 mL/hr at 12/07/16 2100  . milrinone 0.3 mcg/kg/min (12/07/16 2100)  . nitroGLYCERIN    . phenylephrine (NEO-SYNEPHRINE) Adult infusion 20 mcg/min (12/07/16 2154)  . potassium chloride 10 mEq (12/07/16 2214)     Lab Results  Component Value Date   WBC 7.9 12/07/2016   HGB 9.2 (L) 12/07/2016   HCT 27.0 (L) 12/07/2016   PLT 134 (L) 12/07/2016   GLUCOSE 154 (H) 12/07/2016   CHOL 256 (H)  12/05/2016   TRIG 106 12/05/2016   HDL 47 12/05/2016   LDLCALC 188 (H) 12/05/2016   ALT 15 (L) 12/06/2016   AST 26 12/06/2016   NA 141 12/07/2016   K 3.7 12/07/2016   CL 106 12/07/2016   CREATININE 0.90 12/07/2016   BUN 11 12/07/2016   CO2 25 12/06/2016   TSH 1.969 12/04/2016   INR 1.44 12/07/2016   HGBA1C 5.7 (H) 12/06/2016   Stable on vent Not bleeding  Et tube in too far to be pulled   Grace Isaac MD  Beeper [phone] Office [phone] 12/07/2016 10:24 PM

## 2016-12-07 NOTE — Progress Notes (Signed)
Dr. Roxy Manns informed of blood gas, new orders given. Will continue to monitor.

## 2016-12-07 NOTE — Progress Notes (Signed)
Respiratory notified of ABG, readiness to decrease PEEP from 8 to 5, and retracting ETT 2cm with MD at bedside per CXR.

## 2016-12-07 NOTE — Progress Notes (Addendum)
Pre Procedure note for inpatients:   Kevin Burgess has been scheduled for Procedure(s): CORONARY ARTERY BYPASS GRAFTING (CABG) (N/A) TRANSESOPHAGEAL ECHOCARDIOGRAM (TEE) (N/A) today. The various methods of treatment have been discussed with the patient. After consideration of the risks, benefits and treatment options the patient has consented to the planned procedure.   The patient has been seen and labs reviewed. There are no changes in the patient's condition to prevent proceeding with the planned procedure today.  However, CTA of neck reveals what may be a significant aneurysm involving the ascending thoracic aorta and ECHO reveals moderate aortic insufficiency.  I discussed w/ patient that we will examine his aorta and aortic valve in the OR and proceed with surgical repair if indicated.  Recent labs:  Lab Results  Component Value Date   WBC 6.2 12/06/2016   HGB 9.9 (L) 12/06/2016   HCT 29.5 (L) 12/06/2016   PLT 228 12/06/2016   GLUCOSE 91 12/06/2016   CHOL 256 (H) 12/05/2016   TRIG 106 12/05/2016   HDL 47 12/05/2016   LDLCALC 188 (H) 12/05/2016   ALT 15 (L) 12/06/2016   AST 26 12/06/2016   NA 138 12/06/2016   K 3.4 (L) 12/06/2016   CL 104 12/06/2016   CREATININE 1.15 12/06/2016   BUN 16 12/06/2016   CO2 25 12/06/2016   TSH 1.969 12/04/2016   INR 1.09 12/06/2016   HGBA1C 5.7 (H) 12/06/2016    Rexene Alberts, MD 12/07/2016 7:31 AM

## 2016-12-08 ENCOUNTER — Encounter (HOSPITAL_COMMUNITY): Payer: Self-pay

## 2016-12-08 ENCOUNTER — Inpatient Hospital Stay (HOSPITAL_COMMUNITY): Payer: Medicare Other

## 2016-12-08 LAB — BPAM FFP
BLOOD PRODUCT EXPIRATION DATE: 201810032359
BLOOD PRODUCT EXPIRATION DATE: 201810032359
Blood Product Expiration Date: 201810032359
Blood Product Expiration Date: 201810032359
ISSUE DATE / TIME: 201809281310
ISSUE DATE / TIME: 201809281310
ISSUE DATE / TIME: 201809281648
ISSUE DATE / TIME: 201809281830
UNIT TYPE AND RH: 6200
UNIT TYPE AND RH: 5100
UNIT TYPE AND RH: 5100
Unit Type and Rh: 6200

## 2016-12-08 LAB — POCT I-STAT 3, ART BLOOD GAS (G3+)
ACID-BASE DEFICIT: 5 mmol/L — AB (ref 0.0–2.0)
Acid-base deficit: 4 mmol/L — ABNORMAL HIGH (ref 0.0–2.0)
Acid-base deficit: 5 mmol/L — ABNORMAL HIGH (ref 0.0–2.0)
BICARBONATE: 21.2 mmol/L (ref 20.0–28.0)
Bicarbonate: 21.8 mmol/L (ref 20.0–28.0)
Bicarbonate: 21.5 mmol/L (ref 20.0–28.0)
O2 SAT: 93 %
O2 SAT: 93 %
O2 SAT: 93 %
PCO2 ART: 42.1 mmHg (ref 32.0–48.0)
PCO2 ART: 42.9 mmHg (ref 32.0–48.0)
PH ART: 7.322 — AB (ref 7.350–7.450)
PO2 ART: 71 mmHg — AB (ref 83.0–108.0)
Patient temperature: 36.9
Patient temperature: 36.8
TCO2: 22 mmol/L (ref 22–32)
TCO2: 23 mmol/L (ref 22–32)
TCO2: 23 mmol/L (ref 22–32)
pCO2 arterial: 41.2 mmHg (ref 32.0–48.0)
pH, Arterial: 7.316 — ABNORMAL LOW (ref 7.350–7.450)
pH, Arterial: 7.307 — ABNORMAL LOW (ref 7.350–7.450)
pO2, Arterial: 72 mmHg — ABNORMAL LOW (ref 83.0–108.0)
pO2, Arterial: 74 mmHg — ABNORMAL LOW (ref 83.0–108.0)

## 2016-12-08 LAB — BPAM PLATELET PHERESIS
BLOOD PRODUCT EXPIRATION DATE: 201809302359
Blood Product Expiration Date: 201809292359
ISSUE DATE / TIME: 201809281309
ISSUE DATE / TIME: 201809281309
Unit Type and Rh: 5100
Unit Type and Rh: 6200

## 2016-12-08 LAB — PREPARE FRESH FROZEN PLASMA
UNIT DIVISION: 0
UNIT DIVISION: 0
UNIT DIVISION: 0
Unit division: 0

## 2016-12-08 LAB — CBC
HEMATOCRIT: 26.6 % — AB (ref 39.0–52.0)
HEMATOCRIT: 28 % — AB (ref 39.0–52.0)
HEMOGLOBIN: 9 g/dL — AB (ref 13.0–17.0)
HEMOGLOBIN: 9.4 g/dL — AB (ref 13.0–17.0)
MCH: 29.3 pg (ref 26.0–34.0)
MCH: 29.4 pg (ref 26.0–34.0)
MCHC: 33.8 g/dL (ref 30.0–36.0)
MCHC: 33.6 g/dL (ref 30.0–36.0)
MCV: 86.6 fL (ref 78.0–100.0)
MCV: 87.5 fL (ref 78.0–100.0)
PLATELETS: 132 10*3/uL — AB (ref 150–400)
Platelets: 151 10*3/uL (ref 150–400)
RBC: 3.07 MIL/uL — AB (ref 4.22–5.81)
RBC: 3.2 MIL/uL — ABNORMAL LOW (ref 4.22–5.81)
RDW: 14.7 % (ref 11.5–15.5)
RDW: 15.3 % (ref 11.5–15.5)
WBC: 8.6 10*3/uL (ref 4.0–10.5)
WBC: 14.8 10*3/uL — ABNORMAL HIGH (ref 4.0–10.5)

## 2016-12-08 LAB — COOXEMETRY PANEL
Carboxyhemoglobin: 0.8 % (ref 0.5–1.5)
Methemoglobin: 1.2 % (ref 0.0–1.5)
O2 Saturation: 52.3 %
Total hemoglobin: 10.9 g/dL — ABNORMAL LOW (ref 12.0–16.0)

## 2016-12-08 LAB — GLUCOSE, CAPILLARY
GLUCOSE-CAPILLARY: 122 mg/dL — AB (ref 65–99)
GLUCOSE-CAPILLARY: 132 mg/dL — AB (ref 65–99)
GLUCOSE-CAPILLARY: 132 mg/dL — AB (ref 65–99)
Glucose-Capillary: 108 mg/dL — ABNORMAL HIGH (ref 65–99)
Glucose-Capillary: 111 mg/dL — ABNORMAL HIGH (ref 65–99)
Glucose-Capillary: 114 mg/dL — ABNORMAL HIGH (ref 65–99)
Glucose-Capillary: 116 mg/dL — ABNORMAL HIGH (ref 65–99)
Glucose-Capillary: 117 mg/dL — ABNORMAL HIGH (ref 65–99)
Glucose-Capillary: 121 mg/dL — ABNORMAL HIGH (ref 65–99)
Glucose-Capillary: 124 mg/dL — ABNORMAL HIGH (ref 65–99)
Glucose-Capillary: 135 mg/dL — ABNORMAL HIGH (ref 65–99)
Glucose-Capillary: 138 mg/dL — ABNORMAL HIGH (ref 65–99)

## 2016-12-08 LAB — PREPARE PLATELET PHERESIS
UNIT DIVISION: 0
Unit division: 0

## 2016-12-08 LAB — POCT I-STAT, CHEM 8
BUN: 16 mg/dL (ref 6–20)
CHLORIDE: 106 mmol/L (ref 101–111)
Calcium, Ion: 1.02 mmol/L — ABNORMAL LOW (ref 1.15–1.40)
Creatinine, Ser: 1.3 mg/dL — ABNORMAL HIGH (ref 0.61–1.24)
Glucose, Bld: 124 mg/dL — ABNORMAL HIGH (ref 65–99)
HEMATOCRIT: 27 % — AB (ref 39.0–52.0)
HEMOGLOBIN: 9.2 g/dL — AB (ref 13.0–17.0)
POTASSIUM: 4.6 mmol/L (ref 3.5–5.1)
SODIUM: 140 mmol/L (ref 135–145)
TCO2: 21 mmol/L — ABNORMAL LOW (ref 22–32)

## 2016-12-08 LAB — BASIC METABOLIC PANEL
ANION GAP: 5 (ref 5–15)
BUN: 9 mg/dL (ref 6–20)
CHLORIDE: 111 mmol/L (ref 101–111)
CO2: 23 mmol/L (ref 22–32)
Calcium: 7.2 mg/dL — ABNORMAL LOW (ref 8.9–10.3)
Creatinine, Ser: 1.05 mg/dL (ref 0.61–1.24)
GFR calc Af Amer: >60 mL/min (ref >60)
GLUCOSE: 125 mg/dL — AB (ref 65–99)
POTASSIUM: 4.2 mmol/L (ref 3.5–5.1)
Sodium: 139 mmol/L (ref 135–145)

## 2016-12-08 LAB — CREATININE, SERUM
Creatinine, Ser: 1.55 mg/dL — ABNORMAL HIGH (ref 0.61–1.24)
GFR calc non Af Amer: 42 mL/min — ABNORMAL LOW (ref >60)
GFR, EST AFRICAN AMERICAN: 49 mL/min — AB (ref >60)

## 2016-12-08 LAB — MAGNESIUM
MAGNESIUM: 3.2 mg/dL — AB (ref 1.7–2.4)
MAGNESIUM: 3.1 mg/dL — AB (ref 1.7–2.4)

## 2016-12-08 MED ORDER — ORAL CARE MOUTH RINSE
15.0000 mL | Freq: Two times a day (BID) | OROMUCOSAL | Status: DC
  Administered 2016-12-08 – 2016-12-16 (×11): 15 mL via OROMUCOSAL

## 2016-12-08 MED ORDER — SODIUM CHLORIDE 0.9 % IV SOLN
0.0000 ug/min | INTRAVENOUS | Status: DC
  Administered 2016-12-08: 50 ug/min via INTRAVENOUS
  Administered 2016-12-08: 88 ug/min via INTRAVENOUS
  Filled 2016-12-08 (×3): qty 4

## 2016-12-08 MED ORDER — INSULIN ASPART 100 UNIT/ML ~~LOC~~ SOLN
0.0000 [IU] | SUBCUTANEOUS | Status: DC
Start: 2016-12-08 — End: 2016-12-09
  Administered 2016-12-08 – 2016-12-09 (×5): 2 [IU] via SUBCUTANEOUS

## 2016-12-08 MED ORDER — FUROSEMIDE 10 MG/ML IJ SOLN
40.0000 mg | Freq: Once | INTRAMUSCULAR | Status: AC
  Administered 2016-12-08: 40 mg via INTRAVENOUS
  Filled 2016-12-08: qty 4

## 2016-12-08 MED ORDER — PANTOPRAZOLE SODIUM 40 MG PO TBEC
40.0000 mg | DELAYED_RELEASE_TABLET | Freq: Every day | ORAL | Status: DC
  Administered 2016-12-08 – 2016-12-16 (×9): 40 mg via ORAL
  Filled 2016-12-08 (×10): qty 1

## 2016-12-08 NOTE — Procedures (Signed)
Extubation Procedure Note  Patient Details:   Name: Kevin Burgess DOB: 1940/08/19 MRN: [id number]   Airway Documentation:  Airway 8 mm (Active)  Secured at (cm) 23 cm 12/07/2016 11:30 PM  Measured From Lips 12/07/2016 11:30 PM  Secured Location Left 12/07/2016 11:30 PM  Secured By [employer] 12/07/2016 11:30 PM  Tube Holder Repositioned Yes 12/07/2016 11:30 PM  Cuff Pressure (cm H2O) 26 cm H2O 12/07/2016  8:24 PM  Site Condition Dry 12/07/2016 10:30 PM    Evaluation  O2 sats: stable throughout Complications: No apparent complications Patient did tolerate procedure well. Bilateral Breath Sounds: Clear   Yes  Patient alert, adequate NIF and VC prior to extubation.  RN at bedside. Placed on 4L [place].  Lacretia Nicks 12/08/2016, 5:11 AM

## 2016-12-08 NOTE — Progress Notes (Signed)
RWP started. Respiratory placed pt on 40/4.

## 2016-12-08 NOTE — Progress Notes (Signed)
Pt back on full support d/t failed wean. Unable to maintain CI >1.8, RR was >25. ABG decent. Pt denies pain. Will address CI and continue to monitor.

## 2016-12-08 NOTE — Progress Notes (Signed)
Respiratory notified readiness to switch to CPAP/PS per RWP.

## 2016-12-08 NOTE — Progress Notes (Signed)
Pt passed ABG and mechanics parameters, extubated at 05:08 by respiratory therapist. Will continue to monitor closely.

## 2016-12-08 NOTE — Progress Notes (Signed)
Respiratory notified readiness to wean.

## 2016-12-08 NOTE — Progress Notes (Signed)
Patient ID: Kean Gautreau, male   DOB: January 31, 1941, 76 y.o.   MRN: [id number] TCTS DAILY ICU PROGRESS NOTE                   [address]            [place] [address]          [phone]   1 Day Post-Op Procedure(s) (LRB): CORONARY ARTERY BYPASS GRAFTING (CABG) X 2 (N/A) TRANSESOPHAGEAL ECHOCARDIOGRAM (TEE) (N/A) BIOLOGICAL BENTALL AORTIC ROOT REPLACEMENT (N/A) ENDOVEIN HARVEST OF GREATER SAPHENOUS VEIN (Left) THORACIC ASCENDING ANEURYSM REPAIR (AAA) (N/A)  Total Length of Stay:  LOS: 4 days   Subjective: Extubated about 5 am , awkae and neuro inatct  Objective: Vital signs in last 24 hours: Temp:  [96.3 F (35.7 C)-98.6 F (37 C)] 97.7 F (36.5 C) (09/29 0900) Pulse Rate:  [79-92] 80 (09/29 0900) Cardiac Rhythm: Atrial paced (09/29 0800) Resp:  [11-46] 17 (09/29 0900) BP: (83-114)/(60-75) 114/65 (09/29 0900) SpO2:  [96 %-100 %] 96 % (09/29 0900) Arterial Line BP: (84-132)/(41-67) 111/41 (09/29 0900) FiO2 (%):  [40 %-100 %] 40 % (09/29 0409) Weight:  [171 lb 4.8 oz (77.7 kg)-174 lb 13.2 oz (79.3 kg)] 174 lb 13.2 oz (79.3 kg) (09/29 0500)  Filed Weights   12/07/16 0500 12/07/16 2000 12/08/16 0500  Weight: 154 lb 1.6 oz (69.9 kg) 171 lb 4.8 oz (77.7 kg) 174 lb 13.2 oz (79.3 kg)    Weight change: 17 lb 3.1 oz (7.8 kg)   Hemodynamic parameters for last 24 hours: PAP: (23-59)/(14-38) 43/28 CO:  [2.9 L/min-3.8 L/min] 3 L/min CI:  [1.6 L/min/m2-2.1 L/min/m2] 1.7 L/min/m2  Intake/Output from previous day: 09/28 0701 - 09/29 0700 In: 9403.8 [I.V.:4620.8; IZTIW:5809; NG/GT:150; IV Piggyback:2250] Out: 9833 [Urine:2575; Emesis/NG output:50; Blood:1200; Chest Tube:1160]  Intake/Output this shift: Total I/O In: 241.5 [I.V.:241.5] Out: 37 [Urine:17; Chest Tube:20]  Current Meds: Scheduled Meds: . acetaminophen  1,000 mg Oral Q6H   Or  . acetaminophen (TYLENOL) oral liquid 160 mg/5 mL  1,000 mg Per Tube Q6H  . aspirin EC  325 mg Oral Daily   Or  .  aspirin  324 mg Per Tube Daily  . bisacodyl  10 mg Oral Daily   Or  . bisacodyl  10 mg Rectal Daily  . chlorhexidine gluconate (MEDLINE KIT)  15 mL Mouth Rinse BID  . Chlorhexidine Gluconate Cloth  6 each Topical Daily  . docusate sodium  200 mg Oral Daily  . insulin aspart  0-24 Units Subcutaneous Q4H  . mouth rinse  15 mL Mouth Rinse BID  . metoprolol tartrate  12.5 mg Oral BID   Or  . metoprolol tartrate  12.5 mg Per Tube BID  . mupirocin ointment  1 application Nasal BID  . [START ON 12/09/2016] pantoprazole  40 mg Oral Daily  . sodium chloride flush  3 mL Intravenous Q12H   Continuous Infusions: . sodium chloride 20 mL/hr at 12/08/16 0900  . sodium chloride    . sodium chloride 20 mL/hr at 12/08/16 0900  . cefUROXime (ZINACEF)  IV 1.5 g (12/08/16 0904)  . dexmedetomidine (PRECEDEX) IV infusion Stopped (12/08/16 0306)  . DOPamine 3 mcg/kg/min (12/08/16 0900)  . lactated ringers    . lactated ringers 10 mL/hr at 12/08/16 0900  . lactated ringers Stopped (12/08/16 0800)  . milrinone 0.3 mcg/kg/min (12/08/16 0900)  . nitroGLYCERIN    . phenylephrine (NEO-SYNEPHRINE) Adult infusion 80 mcg/min (12/08/16 0906)   PRN Meds:.sodium chloride,  lactated ringers, metoprolol tartrate, midazolam, morphine injection, morphine injection, ondansetron (ZOFRAN) IV, oxyCODONE, sodium chloride flush, traMADol  General appearance: alert and cooperative Neurologic: intact Heart: regular rate and rhythm, S1, S2 normal, no murmur, click, rub or gallop Lungs: diminished breath sounds bibasilar Abdomen: soft, non-tender; bowel sounds normal; no masses,  no organomegaly Extremities: extremities normal, atraumatic, no cyanosis or edema and Homans sign is negative, no sign of DVT Wound: intact  Lab Results: CBC: Recent Labs  12/07/16 2153 12/07/16 2200 12/08/16 0211  WBC 7.9  --  8.6  HGB 8.9* 9.2* 9.0*  HCT 26.5* 27.0* 26.6*  PLT 134*  --  132*   BMET:  Recent Labs  12/06/16 0433   12/07/16 2200 12/08/16 0211  NA 138  < > 141 139  K 3.4*  < > 3.7 4.2  CL 104  < > 106 111  CO2 25  --   --  23  GLUCOSE 91  < > 154* 125*  BUN 16  < > 11 9  CREATININE 1.15  < > 0.90 1.05  CALCIUM 9.0  --   --  7.2*  < > = values in this interval not displayed.  CMET: Lab Results  Component Value Date   WBC 8.6 12/08/2016   HGB 9.0 (L) 12/08/2016   HCT 26.6 (L) 12/08/2016   PLT 132 (L) 12/08/2016   GLUCOSE 125 (H) 12/08/2016   CHOL 256 (H) 12/05/2016   TRIG 106 12/05/2016   HDL 47 12/05/2016   LDLCALC 188 (H) 12/05/2016   ALT 15 (L) 12/06/2016   AST 26 12/06/2016   NA 139 12/08/2016   K 4.2 12/08/2016   CL 111 12/08/2016   CREATININE 1.05 12/08/2016   BUN 9 12/08/2016   CO2 23 12/08/2016   TSH 1.969 12/04/2016   INR 1.44 12/07/2016   HGBA1C 5.7 (H) 12/06/2016      PT/INR:  Recent Labs  12/07/16 1615  LABPROT 17.4*  INR 1.44   Radiology: Dg Chest Port 1 View  Result Date: 12/08/2016 CLINICAL DATA:  CABG EXAM: PORTABLE CHEST 1 VIEW COMPARISON:  Yesterday FINDINGS: Tracheal and esophageal extubation. Mediastinal drains remain. There is a Swan-Ganz catheter directed towards the right main pulmonary artery. Lower volumes with increased atelectasis and small pleural effusions. No visible pneumothorax. Grossly stable heart size. Status post aortic valve replacement and CABG. IMPRESSION: 1. Lower volumes after extubation. Atelectasis and small pleural effusions. 2. Thoracic drains.  No visible pneumothorax. Electronically Signed   By: Monte Fantasia M.D.   On: 12/08/2016 08:18   Dg Chest Port 1 View  Result Date: 12/07/2016 CLINICAL DATA:  Status post CABG. EXAM: PORTABLE CHEST 1 VIEW COMPARISON:  12/06/2016. FINDINGS: Interval post CABG changes. Endotracheal tube tip 1.2 cm above the carina. Right jugular Swan-Ganz catheter tip in the proximal right main pulmonary artery. Nasogastric tube extending into the stomach. Mediastinal and bilateral chest tubes without  pneumothorax. Stable enlarged cardiac silhouette and prominent interstitial markings. Interval patchy opacity at the left lung base and minimal right basilar atelectasis. Thoracic spine degenerative changes. IMPRESSION: 1. Endotracheal tube tip 1.2 cm above the carina. It is recommended that this be retracted 5 cm. 2. Interval probable patchy atelectasis at the left lung base. Aspiration pneumonitis could also have this appearance. 3. Minimal right basilar atelectasis. 4. Stable cardiomegaly and probable interstitial pulmonary edema superimposed on chronic interstitial lung disease. Electronically Signed   By: Claudie Revering M.D.   On: 12/07/2016 16:26     Assessment/Plan:  S/P Procedure(s) (LRB): CORONARY ARTERY BYPASS GRAFTING (CABG) X 2 (N/A) TRANSESOPHAGEAL ECHOCARDIOGRAM (TEE) (N/A) BIOLOGICAL BENTALL AORTIC ROOT REPLACEMENT (N/A) ENDOVEIN HARVEST OF GREATER SAPHENOUS VEIN (Left) THORACIC ASCENDING ANEURYSM REPAIR (AAA) (N/A) Mobilize Diabetes control Continue foley due to strict I&O, patient in ICU and urinary output monitoring Expected Acute  Blood - loss Anemia Still on milrinone and neo - check coox Cr ok - hold on diuresis until on less neo    Grace Isaac 12/08/2016 9:24 AM

## 2016-12-08 NOTE — Progress Notes (Signed)
Pt flipped to 40/4 by respiratory therapist per protocol. Will continue to monitor.

## 2016-12-08 NOTE — Progress Notes (Signed)
Called respiratory again to communicate pt is still ready to start weaning again. Michela Pitcher would come over within next half hour.

## 2016-12-08 NOTE — Progress Notes (Signed)
Patient ID: Kevin Burgess, male   DOB: 30-Mar-1940, 76 y.o.   MRN: [id number] EVENING ROUNDS NOTE :     [address]       [place] [address]             [phone]                 1 Day Post-Op Procedure(s) (LRB): CORONARY ARTERY BYPASS GRAFTING (CABG) X 2 (N/A) TRANSESOPHAGEAL ECHOCARDIOGRAM (TEE) (N/A) BIOLOGICAL BENTALL AORTIC ROOT REPLACEMENT (N/A) ENDOVEIN HARVEST OF GREATER SAPHENOUS VEIN (Left) THORACIC ASCENDING ANEURYSM REPAIR (AAA) (N/A)  Total Length of Stay:  LOS: 4 days  BP 124/80   Pulse 89   Temp 98.8 F (37.1 C)   Resp 19   Ht 5\' 7"  (1.702 m)   Wt 174 lb 13.2 oz (79.3 kg)   SpO2 95%   BMI 27.38 kg/m   .Intake/Output      09/29 0701 - 09/30 0700   P.O. 480   I.V. (mL/kg) 1246.6 (15.7)   IV Piggyback 50   Total Intake(mL/kg) 1776.6 (22.4)   Urine (mL/kg/hr) 128 (0.1)   Chest Tube 440   Total Output 568   Net +1208.6         . sodium chloride 20 mL/hr at 12/08/16 1900  . sodium chloride    . sodium chloride 20 mL/hr at 12/08/16 1900  . cefUROXime (ZINACEF)  IV Stopped (12/08/16 0934)  . dexmedetomidine (PRECEDEX) IV infusion Stopped (12/08/16 0306)  . DOPamine 3 mcg/kg/min (12/08/16 1900)  . lactated ringers    . lactated ringers 10 mL/hr at 12/08/16 1900  . lactated ringers Stopped (12/08/16 0800)  . milrinone 0.3 mcg/kg/min (12/08/16 1900)  . nitroGLYCERIN    . phenylephrine (NEO-SYNEPHRINE) Adult infusion 53 mcg/min (12/08/16 1920)     Lab Results  Component Value Date   WBC 14.8 (H) 12/08/2016   HGB 9.2 (L) 12/08/2016   HCT 27.0 (L) 12/08/2016   PLT 151 12/08/2016   GLUCOSE 124 (H) 12/08/2016   CHOL 256 (H) 12/05/2016   TRIG 106 12/05/2016   HDL 47 12/05/2016   LDLCALC 188 (H) 12/05/2016   ALT 15 (L) 12/06/2016   AST 26 12/06/2016   NA 140 12/08/2016   K 4.6 12/08/2016   CL 106 12/08/2016   CREATININE 1.30 (H) 12/08/2016   BUN 16 12/08/2016   CO2 23 12/08/2016   TSH 1.969 12/04/2016   INR 1.44 12/07/2016   HGBA1C 5.7 (H) 12/06/2016   200 from mt today , d/c in am Cr ok , urine output low past 12 hours  Continue dopamine  Give lasix tonight   Grace Isaac MD  Beeper [phone] Office [phone] 12/08/2016 7:46 PM

## 2016-12-08 NOTE — Progress Notes (Signed)
Pulmonary mechanics done prior to extubation. NIF-40 and 1.5L on VC with good Pt effort.

## 2016-12-08 NOTE — Progress Notes (Signed)
Pt flipped to CPAP/PS by respiratory therapist per protocol. Will draw ABG in 20 minutes and continue to monitor closely.

## 2016-12-08 NOTE — Progress Notes (Signed)
Pt placed on CPAP/PS by respiratory. Will continue to monitor closely and draw ABG at 01:55.

## 2016-12-09 ENCOUNTER — Encounter (HOSPITAL_COMMUNITY): Payer: Self-pay | Admitting: Thoracic Surgery (Cardiothoracic Vascular Surgery)

## 2016-12-09 ENCOUNTER — Inpatient Hospital Stay (HOSPITAL_COMMUNITY): Payer: Medicare Other

## 2016-12-09 LAB — BASIC METABOLIC PANEL
Anion gap: 7 (ref 5–15)
BUN: 17 mg/dL (ref 6–20)
CO2: 21 mmol/L — ABNORMAL LOW (ref 22–32)
Calcium: 7.2 mg/dL — ABNORMAL LOW (ref 8.9–10.3)
Chloride: 109 mmol/L (ref 101–111)
Creatinine, Ser: 1.72 mg/dL — ABNORMAL HIGH (ref 0.61–1.24)
GFR calc Af Amer: 43 mL/min — ABNORMAL LOW (ref >60)
GFR calc non Af Amer: 37 mL/min — ABNORMAL LOW (ref >60)
Glucose, Bld: 108 mg/dL — ABNORMAL HIGH (ref 65–99)
Potassium: 4.2 mmol/L (ref 3.5–5.1)
Sodium: 137 mmol/L (ref 135–145)

## 2016-12-09 LAB — GLUCOSE, CAPILLARY
GLUCOSE-CAPILLARY: 94 mg/dL (ref 65–99)
GLUCOSE-CAPILLARY: 100 mg/dL — AB (ref 65–99)
GLUCOSE-CAPILLARY: 113 mg/dL — AB (ref 65–99)
GLUCOSE-CAPILLARY: 120 mg/dL — AB (ref 65–99)
GLUCOSE-CAPILLARY: 121 mg/dL — AB (ref 65–99)
Glucose-Capillary: 84 mg/dL (ref 65–99)

## 2016-12-09 LAB — POCT I-STAT, CHEM 8
BUN: 12 mg/dL (ref 6–20)
CALCIUM ION: 0.99 mmol/L — AB (ref 1.15–1.40)
CHLORIDE: 105 mmol/L (ref 101–111)
Creatinine, Ser: 0.8 mg/dL (ref 0.61–1.24)
Glucose, Bld: 134 mg/dL — ABNORMAL HIGH (ref 65–99)
HEMATOCRIT: 23 % — AB (ref 39.0–52.0)
Hemoglobin: 7.8 g/dL — ABNORMAL LOW (ref 13.0–17.0)
POTASSIUM: 3.4 mmol/L — AB (ref 3.5–5.1)
SODIUM: 140 mmol/L (ref 135–145)
TCO2: 21 mmol/L — ABNORMAL LOW (ref 22–32)

## 2016-12-09 LAB — CBC
HCT: 27.1 % — ABNORMAL LOW (ref 39.0–52.0)
Hemoglobin: 9.1 g/dL — ABNORMAL LOW (ref 13.0–17.0)
MCH: 29.6 pg (ref 26.0–34.0)
MCHC: 33.6 g/dL (ref 30.0–36.0)
MCV: 88.3 fL (ref 78.0–100.0)
Platelets: 129 10*3/uL — ABNORMAL LOW (ref 150–400)
RBC: 3.07 MIL/uL — ABNORMAL LOW (ref 4.22–5.81)
RDW: 15.5 % (ref 11.5–15.5)
WBC: 12.9 10*3/uL — ABNORMAL HIGH (ref 4.0–10.5)

## 2016-12-09 LAB — COOXEMETRY PANEL
Carboxyhemoglobin: 1.3 % (ref 0.5–1.5)
Methemoglobin: 0.9 % (ref 0.0–1.5)
O2 Saturation: 57.5 %
Total hemoglobin: 9.4 g/dL — ABNORMAL LOW (ref 12.0–16.0)

## 2016-12-09 MED ORDER — MORPHINE SULFATE (PF) 4 MG/ML IV SOLN: 1.0000 mg | INTRAVENOUS | Status: DC | PRN

## 2016-12-09 MED ORDER — INSULIN ASPART 100 UNIT/ML ~~LOC~~ SOLN
0.0000 [IU] | Freq: Three times a day (TID) | SUBCUTANEOUS | Status: DC
  Administered 2016-12-10: 2 [IU] via SUBCUTANEOUS

## 2016-12-09 MED ORDER — MILRINONE LACTATE IN DEXTROSE 20-5 MG/100ML-% IV SOLN
0.1250 ug/kg/min | INTRAVENOUS | Status: DC
  Administered 2016-12-09: 0.125 ug/kg/min via INTRAVENOUS

## 2016-12-09 MED ORDER — FUROSEMIDE 10 MG/ML IJ SOLN
80.0000 mg | Freq: Once | INTRAMUSCULAR | Status: AC
  Administered 2016-12-09: 80 mg via INTRAVENOUS
  Filled 2016-12-09: qty 8

## 2016-12-09 NOTE — Progress Notes (Addendum)
TCTS DAILY ICU PROGRESS NOTE                   [address]            [place] [address]          [phone]   2 Days Post-Op Procedure(s) (LRB): CORONARY ARTERY BYPASS GRAFTING (CABG) X 2 (N/A) TRANSESOPHAGEAL ECHOCARDIOGRAM (TEE) (N/A) BIOLOGICAL BENTALL AORTIC ROOT REPLACEMENT (N/A) ENDOVEIN HARVEST OF GREATER SAPHENOUS VEIN (Left) THORACIC ASCENDING ANEURYSM REPAIR (AAA) (N/A)  Total Length of Stay:  LOS: 5 days   Subjective:  [name] states he is doing okay.  Per nursing he had some confusion this morning.  He is oriented currently.  Objective: Vital signs in last 24 hours: Temp:  [96.7 F (35.9 C)-98.8 F (37.1 C)] 97.7 F (36.5 C) (09/30 0816) Pulse Rate:  [79-91] 88 (09/30 0900) Cardiac Rhythm: Atrial paced (09/30 0730) Resp:  [9-34] 20 (09/30 0900) BP: (94-138)/(59-87) 131/79 (09/30 0900) SpO2:  [91 %-98 %] 94 % (09/30 0900) Arterial Line BP: (85-138)/(46-69) 134/61 (09/30 0900) Weight:  [179 lb 0.2 oz (81.2 kg)] 179 lb 0.2 oz (81.2 kg) (09/30 0500)  Filed Weights   12/07/16 2000 12/08/16 0500 12/09/16 0500  Weight: 171 lb 4.8 oz (77.7 kg) 174 lb 13.2 oz (79.3 kg) 179 lb 0.2 oz (81.2 kg)    Weight change: 7 lb 11.5 oz (3.5 kg)   Hemodynamic parameters for last 24 hours: PAP: (31-53)/(17-31) 35/20 CO:  [3 L/min-3.4 L/min] 3 L/min CI:  [1.7 L/min/m2-1.9 L/min/m2] 1.7 L/min/m2  Intake/Output from previous day: 09/29 0701 - 09/30 0700 In: 2286 [P.O.:480; I.V.:1706; IV Piggyback:100] Out: 1383 [Urine:513; Chest Tube:870]  Intake/Output this shift: Total I/O In: 61.9 [I.V.:61.9] Out: 80 [Urine:30; Chest Tube:50]  Current Meds: Scheduled Meds: . acetaminophen  1,000 mg Oral Q6H   Or  . acetaminophen (TYLENOL) oral liquid 160 mg/5 mL  1,000 mg Per Tube Q6H  . aspirin EC  325 mg Oral Daily   Or  . aspirin  324 mg Per Tube Daily  . bisacodyl  10 mg Oral Daily   Or  . bisacodyl  10 mg Rectal Daily  . chlorhexidine gluconate  (MEDLINE KIT)  15 mL Mouth Rinse BID  . Chlorhexidine Gluconate Cloth  6 each Topical Daily  . docusate sodium  200 mg Oral Daily  . insulin aspart  0-24 Units Subcutaneous Q4H  . mouth rinse  15 mL Mouth Rinse BID  . metoprolol tartrate  12.5 mg Oral BID   Or  . metoprolol tartrate  12.5 mg Per Tube BID  . mupirocin ointment  1 application Nasal BID  . pantoprazole  40 mg Oral Daily  . sodium chloride flush  3 mL Intravenous Q12H   Continuous Infusions: . sodium chloride Stopped (12/08/16 2000)  . sodium chloride    . sodium chloride Stopped (12/08/16 2000)  . dexmedetomidine (PRECEDEX) IV infusion Stopped (12/08/16 0306)  . DOPamine 3 mcg/kg/min (12/09/16 0900)  . lactated ringers    . lactated ringers 10 mL/hr at 12/09/16 0900  . lactated ringers Stopped (12/08/16 0800)  . milrinone 0.125 mcg/kg/min (12/09/16 0900)  . nitroGLYCERIN    . phenylephrine (NEO-SYNEPHRINE) Adult infusion 30 mcg/min (12/09/16 0900)   PRN Meds:.sodium chloride, lactated ringers, metoprolol tartrate, morphine injection, ondansetron (ZOFRAN) IV, oxyCODONE, sodium chloride flush, traMADol  General appearance: alert, cooperative and no distress Heart: regular rate and rhythm Lungs: diminished breath sounds bibasilar Abdomen: soft, non-tender; bowel sounds normal;  no masses,  no organomegaly Extremities: edema trace LE, upper extremity bilateral hands Wound: Aquacel in place  Lab Results: CBC: Recent Labs  12/08/16 1618 12/08/16 1626 12/09/16 0345  WBC 14.8*  --  12.9*  HGB 9.4* 9.2* 9.1*  HCT 28.0* 27.0* 27.1*  PLT 151  --  129*   BMET:  Recent Labs  12/08/16 0211  12/08/16 1626 12/09/16 0345  NA 139  --  140 137  K 4.2  --  4.6 4.2  CL 111  --  106 109  CO2 23  --   --  21*  GLUCOSE 125*  --  124* 108*  BUN 9  --  16 17  CREATININE 1.05  < > 1.30* 1.72*  CALCIUM 7.2*  --   --  7.2*  < > = values in this interval not displayed.  CMET: Lab Results  Component Value Date   WBC 12.9  (H) 12/09/2016   HGB 9.1 (L) 12/09/2016   HCT 27.1 (L) 12/09/2016   PLT 129 (L) 12/09/2016   GLUCOSE 108 (H) 12/09/2016   CHOL 256 (H) 12/05/2016   TRIG 106 12/05/2016   HDL 47 12/05/2016   LDLCALC 188 (H) 12/05/2016   ALT 15 (L) 12/06/2016   AST 26 12/06/2016   NA 137 12/09/2016   K 4.2 12/09/2016   CL 109 12/09/2016   CREATININE 1.72 (H) 12/09/2016   BUN 17 12/09/2016   CO2 21 (L) 12/09/2016   TSH 1.969 12/04/2016   INR 1.44 12/07/2016   HGBA1C 5.7 (H) 12/06/2016    PT/INR:  Recent Labs  12/07/16 1615  LABPROT 17.4*  INR 1.44   Radiology: No results found.   Assessment/Plan: S/P Procedure(s) (LRB): CORONARY ARTERY BYPASS GRAFTING (CABG) X 2 (N/A) TRANSESOPHAGEAL ECHOCARDIOGRAM (TEE) (N/A) BIOLOGICAL BENTALL AORTIC ROOT REPLACEMENT (N/A) ENDOVEIN HARVEST OF GREATER SAPHENOUS VEIN (Left) THORACIC ASCENDING ANEURYSM REPAIR (AAA) (N/A)  1. CV- NSR, remains on Milrinone @ .125, Neo @ 30, Dopamine 3-- Coox is 57.5- will wean as tolerated 2. Pulm- d/c MTs today, Pleural Chest remains elevated- CXR with atelectasis bilaterally, small left pleural effusion 3. Renal- creatinine bump at 1.72 Elevated serum creatinine, likely due to prerenal azotemia +/- acute kidney injury caused by ATN, received IV lasix last night with minimal U/O... Overall U/O remains low 4. Expected post operative blood loss anemia, Hgb at 9.1 5. CBgs controlled 6. Dispo- patient with low U/O despite dopamine and IV Lasix, remains edematous on exam, but with elevation in creatinine will discuss further Lasix with Dr. Servando Snare, wean drips as tolerated, continue current care    Kevin Burgess 12/09/2016 9:51 AM   510 uop 12 hours with dose of lasix, but only 135 over 10 hours  Cr up -  acute  Acute Kidney Injury (any one)  Increase in SCr by > 0.3 within 48 hours  Increase SCr to > 1.5 times baseline  Urine volume < 0.5 ml/kg/h for 6 hrs  ?Stage 1 - Increase in serum creatinine to 1.5 to 1.9 times  baseline, or increase in serum creatinine by ?0.3 mg/dL (?26.5 micromol/L), or reduction in urine output to <0.5 mL/kg per hour for 6 to 12 hours.  ?Stage 2 - Increase in serum creatinine to 2.0 to 2.9 times baseline, or reduction in urine output to <0.5 mL/kg per hour for ?12 hours.  ?Stage 3 - Increase in serum creatinine to 3.0 times baseline, or increase in serum creatinine to ?4.0 mg/dL (?353.6 micromol/L), or reduction in urine output  to <0.3 mL/kg per hour for ?24 hours, or anuria for ?12 hours, or the initiation of renal replacement therapy, or, in patients <18 years, decrease in eGFR to <35 mL   Lab Results  Component Value Date   CREATININE 1.72 (H) 12/09/2016   Estimated Creatinine Clearance: 37.8 mL/min (A) (by C-G formula based on SCr of 1.72 mg/dL (H)).  I have seen and examined Kevin Burgess and agree with the above assessment  and plan. D/c aline,  Will give lasix, continue dopamine , milrinone 0.125    Grace Isaac MD Beeper [phone] Office [phone] 12/09/2016 5:41 PM

## 2016-12-10 ENCOUNTER — Inpatient Hospital Stay (HOSPITAL_COMMUNITY): Payer: Medicare Other

## 2016-12-10 LAB — TYPE AND SCREEN
ABO/RH(D): O POS
ANTIBODY SCREEN: NEGATIVE
UNIT DIVISION: 0
UNIT DIVISION: 0
UNIT DIVISION: 0
Unit division: 0
Unit division: 0
Unit division: 0

## 2016-12-10 LAB — GLUCOSE, CAPILLARY
GLUCOSE-CAPILLARY: 116 mg/dL — AB (ref 65–99)
GLUCOSE-CAPILLARY: 124 mg/dL — AB (ref 65–99)
Glucose-Capillary: 103 mg/dL — ABNORMAL HIGH (ref 65–99)
Glucose-Capillary: 113 mg/dL — ABNORMAL HIGH (ref 65–99)
Glucose-Capillary: 141 mg/dL — ABNORMAL HIGH (ref 65–99)
Glucose-Capillary: 87 mg/dL (ref 65–99)

## 2016-12-10 LAB — BPAM RBC
BLOOD PRODUCT EXPIRATION DATE: 201810182359
BLOOD PRODUCT EXPIRATION DATE: 201810182359
Blood Product Expiration Date: 201810182359
Blood Product Expiration Date: 201810182359
Blood Product Expiration Date: 201810192359
Blood Product Expiration Date: 201810192359
ISSUE DATE / TIME: 201809280816
ISSUE DATE / TIME: 201809280816
ISSUE DATE / TIME: 201809281233
ISSUE DATE / TIME: 201809281233
UNIT TYPE AND RH: 5100
UNIT TYPE AND RH: 5100
UNIT TYPE AND RH: 5100
Unit Type and Rh: 5100
Unit Type and Rh: 5100
Unit Type and Rh: 5100

## 2016-12-10 LAB — BASIC METABOLIC PANEL
ANION GAP: 7 (ref 5–15)
BUN: 30 mg/dL — ABNORMAL HIGH (ref 6–20)
CALCIUM: 6.9 mg/dL — AB (ref 8.9–10.3)
CO2: 22 mmol/L (ref 22–32)
Chloride: 106 mmol/L (ref 101–111)
Creatinine, Ser: 2.26 mg/dL — ABNORMAL HIGH (ref 0.61–1.24)
GFR, EST AFRICAN AMERICAN: 31 mL/min — AB (ref >60)
GFR, EST NON AFRICAN AMERICAN: 27 mL/min — AB (ref >60)
Glucose, Bld: 110 mg/dL — ABNORMAL HIGH (ref 65–99)
Potassium: 4.4 mmol/L (ref 3.5–5.1)
SODIUM: 135 mmol/L (ref 135–145)

## 2016-12-10 LAB — CBC
HCT: 26.1 % — ABNORMAL LOW (ref 39.0–52.0)
HEMOGLOBIN: 8.7 g/dL — AB (ref 13.0–17.0)
MCH: 29.5 pg (ref 26.0–34.0)
MCHC: 33.3 g/dL (ref 30.0–36.0)
MCV: 88.5 fL (ref 78.0–100.0)
Platelets: 102 10*3/uL — ABNORMAL LOW (ref 150–400)
RBC: 2.95 MIL/uL — AB (ref 4.22–5.81)
RDW: 15.6 % — ABNORMAL HIGH (ref 11.5–15.5)
WBC: 8.6 10*3/uL (ref 4.0–10.5)

## 2016-12-10 MED ORDER — SODIUM CHLORIDE 0.9% FLUSH
3.0000 mL | Freq: Two times a day (BID) | INTRAVENOUS | Status: DC
  Administered 2016-12-10: 10 mL via INTRAVENOUS
  Administered 2016-12-10 – 2016-12-16 (×6): 3 mL via INTRAVENOUS

## 2016-12-10 MED ORDER — SODIUM CHLORIDE 0.9% FLUSH
10.0000 mL | Freq: Two times a day (BID) | INTRAVENOUS | Status: DC
  Administered 2016-12-10 – 2016-12-11 (×4): 10 mL
  Administered 2016-12-13: 20 mL

## 2016-12-10 MED ORDER — FUROSEMIDE 10 MG/ML IJ SOLN
10.0000 mg/h | INTRAVENOUS | Status: AC
  Administered 2016-12-10 – 2016-12-11 (×2): 10 mg/h via INTRAVENOUS
  Filled 2016-12-10 (×4): qty 25

## 2016-12-10 MED ORDER — SODIUM CHLORIDE 0.9% FLUSH: 3.0000 mL | INTRAVENOUS | Status: DC | PRN

## 2016-12-10 MED ORDER — MOVING RIGHT ALONG BOOK
Freq: Once | Status: AC
  Administered 2016-12-10: 09:00:00
  Filled 2016-12-10: qty 1

## 2016-12-10 MED ORDER — CHLORHEXIDINE GLUCONATE CLOTH 2 % EX PADS
6.0000 | MEDICATED_PAD | Freq: Every day | CUTANEOUS | Status: DC
  Administered 2016-12-11 – 2016-12-16 (×5): 6 via TOPICAL

## 2016-12-10 MED ORDER — SODIUM CHLORIDE 0.9% FLUSH
10.0000 mL | INTRAVENOUS | Status: DC | PRN
  Administered 2016-12-15: 10 mL
  Administered 2016-12-15: 20 mL
  Filled 2016-12-10 (×2): qty 40

## 2016-12-10 MED ORDER — FUROSEMIDE 10 MG/ML IJ SOLN
120.0000 mg | Freq: Once | INTRAVENOUS | Status: AC
  Administered 2016-12-10: 120 mg via INTRAVENOUS
  Filled 2016-12-10: qty 12

## 2016-12-10 MED ORDER — SODIUM CHLORIDE 0.9 % IV SOLN
250.0000 mL | INTRAVENOUS | Status: DC | PRN
Start: 2016-12-10 — End: 2016-12-16

## 2016-12-10 MED FILL — Mannitol IV Soln 20%: INTRAVENOUS | Qty: 1000 | Status: AC

## 2016-12-10 MED FILL — Electrolyte-R (PH 7.4) Solution: INTRAVENOUS | Qty: 5000 | Status: AC

## 2016-12-10 MED FILL — Lidocaine HCl IV Inj 20 MG/ML: INTRAVENOUS | Qty: 25 | Status: AC

## 2016-12-10 MED FILL — Sodium Chloride IV Soln 0.9%: INTRAVENOUS | Qty: 2000 | Status: AC

## 2016-12-10 NOTE — Progress Notes (Signed)
Peripherally Inserted Central Catheter/Midline Placement  The IV Nurse has discussed with the patient and/or persons authorized to consent for the patient, the purpose of this procedure and the potential benefits and risks involved with this procedure.  The benefits include less needle sticks, lab draws from the catheter, and the patient may be discharged home with the catheter. Risks include, but not limited to, infection, bleeding, blood clot (thrombus formation), and puncture of an artery; nerve damage and irregular heartbeat and possibility to perform a PICC exchange if needed/ordered by physician.  Alternatives to this procedure were also discussed.  Bard Power PICC patient education guide, fact sheet on infection prevention and patient information card has been provided to patient /or left at bedside.    PICC/Midline Placement Documentation        Kevin Burgess 12/10/2016, 3:20 PM

## 2016-12-10 NOTE — Plan of Care (Signed)
Problem: Respiratory: Goal: Levels of oxygenation will improve Outcome: Progressing O2 sats >92%  On 2 L [place]. Pt with minimal effort on I.S., needs frequent prompting.  Problem: Urinary Elimination: Goal: Ability to achieve and maintain adequate renal perfusion and functioning will improve Outcome: Not Progressing Pt with marginal urine output, minimally diuresing from ordered diuretic. Creatinine rising over 2.2, MD aware.

## 2016-12-10 NOTE — Anesthesia Postprocedure Evaluation (Signed)
Anesthesia Post Note  Patient: Dane Kopke  Procedure(s) Performed: CORONARY ARTERY BYPASS GRAFTING (CABG) X 2 (N/A Chest) TRANSESOPHAGEAL ECHOCARDIOGRAM (TEE) (N/A ) BIOLOGICAL BENTALL AORTIC ROOT REPLACEMENT (N/A Chest) ENDOVEIN HARVEST OF GREATER SAPHENOUS VEIN (Left Leg Upper) THORACIC ASCENDING ANEURYSM REPAIR (AAA) (N/A Chest)     Patient location during evaluation: SICU Anesthesia Type: General Level of consciousness: awake and alert Pain management: pain level controlled Vital Signs Assessment: post-procedure vital signs reviewed and stable Respiratory status: spontaneous breathing and respiratory function stable Cardiovascular status: stable Postop Assessment: no apparent nausea or vomiting Anesthetic complications: no Comments: Patient extubated early morning POD 1, HD and respiratory function stable, neuro intact.    Last Vitals:  Vitals:   12/10/16 1609 12/10/16 1700  BP:    Pulse:  94  Resp:    Temp: 36.9 C   SpO2:  99%    Last Pain:  Vitals:   12/10/16 1609  TempSrc: Oral  PainSc:    Pain Goal: Patients Stated Pain Goal: 4 (12/10/16 1200)               Audry Pili

## 2016-12-10 NOTE — Care Management Note (Signed)
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                   Action/Plan: NCM will follow for dc needs.   Expected Discharge Date:                  Expected Discharge Plan:     In-House Referral:     Discharge planning Services  CM Consult  Post Acute Care Choice:    Choice offered to:     DME Arranged:    DME Agency:     HH Arranged:    HH Agency:     Status of Service:  In process, will continue to follow  If discussed at Long Length of Stay Meetings, dates discussed:    Additional Comments:  Zenon Mayo, RN 12/10/2016, 4:17 PM

## 2016-12-10 NOTE — Progress Notes (Signed)
[address]       [place] [address]             [phone]        CARDIOTHORACIC SURGERY PROGRESS NOTE   R3 Days Post-Op Procedure(s) (LRB): CORONARY ARTERY BYPASS GRAFTING (CABG) X 2 (N/A) TRANSESOPHAGEAL ECHOCARDIOGRAM (TEE) (N/A) BIOLOGICAL BENTALL AORTIC ROOT REPLACEMENT (N/A) ENDOVEIN HARVEST OF GREATER SAPHENOUS VEIN (Left) THORACIC ASCENDING ANEURYSM REPAIR (AAA) (N/A)  Subjective: Some confusion overnight.  Currently looks good, cognitive function at baseline.  Denies pain, SOB.  Objective: Vital signs: BP Readings from Last 1 Encounters:  12/10/16 120/80   Pulse Readings from Last 1 Encounters:  12/10/16 90   Resp Readings from Last 1 Encounters:  12/10/16 14   Temp Readings from Last 1 Encounters:  12/10/16 97.9 F (36.6 C) (Oral)    Hemodynamics:    Physical Exam:  Rhythm:   sinus  Breath sounds: clear  Heart sounds:  RRR  Incisions:  Dressings dry, intact  Abdomen:  Soft, non-distended, non-tender  Extremities:  Warm, well-perfused  Chest tubes:  decreasing volume thin serosanguinous output, no air leak    Intake/Output from previous day: 09/30 0701 - 10/01 0700 In: 1039.8 [P.O.:420; I.V.:619.8] Out: 856 [Urine:426; Chest Tube:430] Intake/Output this shift: Total I/O In: 53.8 [I.V.:53.8] Out: -   Lab Results:  CBC: Recent Labs  12/09/16 0345 12/10/16 0405  WBC 12.9* 8.6  HGB 9.1* 8.7*  HCT 27.1* 26.1*  PLT 129* 102*    BMET:  Recent Labs  12/09/16 0345 12/10/16 0405  NA 137 135  K 4.2 4.4  CL 109 106  CO2 21* 22  GLUCOSE 108* 110*  BUN 17 30*  CREATININE 1.72* 2.26*  CALCIUM 7.2* 6.9*     PT/INR:   Recent Labs  12/07/16 1615  LABPROT 17.4*  INR 1.44    CBG (last 3)   Recent Labs  12/09/16 1610 12/09/16 2006 12/10/16 0817  GLUCAP 120* 121* 103*    ABG    Component Value Date/Time   PHART 7.307 (L) 12/08/2016 0612   PCO2ART 42.9 12/08/2016 0612   PO2ART 74.0 (L) 12/08/2016  0612   HCO3 21.5 12/08/2016 0612   TCO2 21 (L) 12/08/2016 1626   ACIDBASEDEF 5.0 (H) 12/08/2016 0612   O2SAT 57.5 12/09/2016 0418    CXR: PORTABLE CHEST 1 VIEW  COMPARISON:  Radiograph of December 09, 2016.  FINDINGS: Stable cardiomegaly. Status post aortic valve repair. Right internal jugular venous sheath is unchanged in position. Right-sided chest tube is noted without pneumothorax. Mild bibasilar subsegmental atelectasis is noted. Bony thorax is unremarkable.  IMPRESSION: Right-sided chest tube without pneumothorax. Mild bibasilar subsegmental atelectasis.   Electronically Signed   By: Marijo Conception, M.D.   On: 12/10/2016 07:39  Assessment/Plan: S/P Procedure(s) (LRB): CORONARY ARTERY BYPASS GRAFTING (CABG) X 2 (N/A) TRANSESOPHAGEAL ECHOCARDIOGRAM (TEE) (N/A) BIOLOGICAL BENTALL AORTIC ROOT REPLACEMENT (N/A) ENDOVEIN HARVEST OF GREATER SAPHENOUS VEIN (Left) THORACIC ASCENDING ANEURYSM REPAIR (AAA) (N/A)  Overall stable, slowly improving Maintaining NSR w/ stable BP on low dose milrinone and dopamine Breathing comfortably w/ O2 sats 95% on 2 L/min Elevated serum creatinine, likely due to prerenal azotemia +/- acute kidney injury caused by ATN w/ preexisting CKD Chronic diastolic CHF with expected post-op volume excess, I/O's balanced over weekend, weight reportedly 27 lbs > preop baseline   Mobilize  Increase lasix  Check coox and wean milrinone off  Continue renal dose dopamine for now  Insert PICC and  remove sheath   Rexene Alberts, MD 12/10/2016 9:05 AM

## 2016-12-10 NOTE — Progress Notes (Addendum)
CT surgery p.m. Rounds  Rising creatinine today, remains on renal dopamine Lasix drip started to augment urine output Sinus rhythm with stable blood pressure  continue current care

## 2016-12-10 NOTE — Progress Notes (Signed)
Pt's O2 sats noted to be decreasing, upon inspection Pt had pulled off [place]. Pt repeatedly reminded of situation (s/p OHS). Pt yelling at staff to "get [place] my house". Re-direction and orientation unsuccessful in calming Pt. It was then noted that the Pt had pull out PIV and bleeding, pressure held and explained expected safe behavior. Again Pt began yelling to get out of his house. Bilateral soft wrist restraints applied per protocol and explanation provided to Pt about release criteria. MD and family to be notified.

## 2016-12-11 ENCOUNTER — Inpatient Hospital Stay (HOSPITAL_COMMUNITY): Payer: Medicare Other

## 2016-12-11 LAB — CBC
HEMATOCRIT: 26 % — AB (ref 39.0–52.0)
Hemoglobin: 9 g/dL — ABNORMAL LOW (ref 13.0–17.0)
MCH: 30.1 pg (ref 26.0–34.0)
MCHC: 34.6 g/dL (ref 30.0–36.0)
MCV: 87 fL (ref 78.0–100.0)
Platelets: 135 10*3/uL — ABNORMAL LOW (ref 150–400)
RBC: 2.99 MIL/uL — ABNORMAL LOW (ref 4.22–5.81)
RDW: 15.5 % (ref 11.5–15.5)
WBC: 9.4 10*3/uL (ref 4.0–10.5)

## 2016-12-11 LAB — COOXEMETRY PANEL
Carboxyhemoglobin: 1.1 % (ref 0.5–1.5)
METHEMOGLOBIN: 1.2 % (ref 0.0–1.5)
O2 Saturation: 50.4 %
TOTAL HEMOGLOBIN: 8.9 g/dL — AB (ref 12.0–16.0)

## 2016-12-11 LAB — GLUCOSE, CAPILLARY
GLUCOSE-CAPILLARY: 86 mg/dL (ref 65–99)
GLUCOSE-CAPILLARY: 80 mg/dL (ref 65–99)
Glucose-Capillary: 115 mg/dL — ABNORMAL HIGH (ref 65–99)
Glucose-Capillary: 97 mg/dL (ref 65–99)

## 2016-12-11 LAB — COMPREHENSIVE METABOLIC PANEL
ALBUMIN: 2.8 g/dL — AB (ref 3.5–5.0)
ALT: 61 U/L (ref 17–63)
AST: 97 U/L — ABNORMAL HIGH (ref 15–41)
Alkaline Phosphatase: 97 U/L (ref 38–126)
Anion gap: 9 (ref 5–15)
BILIRUBIN TOTAL: 2.6 mg/dL — AB (ref 0.3–1.2)
BUN: 35 mg/dL — ABNORMAL HIGH (ref 6–20)
CO2: 24 mmol/L (ref 22–32)
Calcium: 7.3 mg/dL — ABNORMAL LOW (ref 8.9–10.3)
Chloride: 102 mmol/L (ref 101–111)
Creatinine, Ser: 2 mg/dL — ABNORMAL HIGH (ref 0.61–1.24)
GFR, EST AFRICAN AMERICAN: 36 mL/min — AB (ref >60)
GFR, EST NON AFRICAN AMERICAN: 31 mL/min — AB (ref >60)
Glucose, Bld: 119 mg/dL — ABNORMAL HIGH (ref 65–99)
POTASSIUM: 3.4 mmol/L — AB (ref 3.5–5.1)
Sodium: 135 mmol/L (ref 135–145)
TOTAL PROTEIN: 5.8 g/dL — AB (ref 6.5–8.1)

## 2016-12-11 LAB — PREALBUMIN: PREALBUMIN: 11.1 mg/dL — AB (ref 18–38)

## 2016-12-11 MED ORDER — POTASSIUM CHLORIDE 10 MEQ/50ML IV SOLN
10.0000 meq | INTRAVENOUS | Status: AC
  Administered 2016-12-11 (×3): 10 meq via INTRAVENOUS
  Filled 2016-12-11 (×2): qty 50

## 2016-12-11 MED ORDER — POTASSIUM CHLORIDE CRYS ER 20 MEQ PO TBCR
20.0000 meq | EXTENDED_RELEASE_TABLET | Freq: Two times a day (BID) | ORAL | Status: DC
  Administered 2016-12-11 – 2016-12-16 (×8): 20 meq via ORAL
  Filled 2016-12-11 (×8): qty 1

## 2016-12-11 MED ORDER — POTASSIUM CHLORIDE 10 MEQ/50ML IV SOLN
10.0000 meq | INTRAVENOUS | Status: AC
  Administered 2016-12-11 (×3): 10 meq via INTRAVENOUS
  Filled 2016-12-11 (×3): qty 50

## 2016-12-11 NOTE — Progress Notes (Signed)
      [address]       [place] [address]             [phone]      POD # 4 CABG/ biologic Bent[place]  Sitting up in chair  Was off dopamine earlier now back on 1 mcg  Remo Lipps C. Roxan Hockey, MD [facility] [phone]'

## 2016-12-11 NOTE — Progress Notes (Signed)
Patient refused to get out of bed, stand on scale, sit in chair, or walk this morning.

## 2016-12-11 NOTE — Progress Notes (Signed)
[address]       [place], [address]             [phone]        CARDIOTHORACIC SURGERY PROGRESS NOTE   R4 Days Post-Op Procedure(s) (LRB): CORONARY ARTERY BYPASS GRAFTING (CABG) X 2 (N/A) TRANSESOPHAGEAL ECHOCARDIOGRAM (TEE) (N/A) BIOLOGICAL BENTALL AORTIC ROOT REPLACEMENT (N/A) ENDOVEIN HARVEST OF GREATER SAPHENOUS VEIN (Left) THORACIC ASCENDING ANEURYSM REPAIR (AAA) (N/A)  Subjective: Alert and awake in bed. He notes that he "is feeling pretty good," asking to get up from bed. Denied any SOB or CP.   Objective: Vital signs: BP Readings from Last 1 Encounters:  12/11/16 137/85   Pulse Readings from Last 1 Encounters:  12/11/16 97   Resp Readings from Last 1 Encounters:  12/11/16 15   Temp Readings from Last 1 Encounters:  12/11/16 97.7 F (36.5 C) (Oral)    Hemodynamics:    Physical Exam:  Rhythm:   NSR at 99bpm  Breath sounds: Crackles in the bases bilaterally, slightly diminished  Heart sounds:  RRR, borderline tachycardic, no m/r/g  Incisions:  Median sternotomy incision is clean, dry, intact without erythema or drainage.   Abdomen:  Soft, non-tender  Extremities:  2+ pitting edema bilaterally, 2+ DP/PT pulses bilaterally   Intake/Output from previous day: 10/01 0701 - 10/02 0700 In: 715.7 [I.V.:665.7; IV Piggyback:50] Out: 6045 [Urine:3460; Chest Tube:550] Intake/Output this shift: No intake/output data recorded.  Lab Results:  CBC:  Recent Labs  12/10/16 0405 12/11/16 0444  WBC 8.6 9.4  HGB 8.7* 9.0*  HCT 26.1* 26.0*  PLT 102* 135*    BMET:   Recent Labs  12/10/16 0405 12/11/16 0444  NA 135 135  K 4.4 3.4*  CL 106 102  CO2 22 24  GLUCOSE 110* 119*  BUN 30* 35*  CREATININE 2.26* 2.00*  CALCIUM 6.9* 7.3*     PT/INR:  No results for input(s): LABPROT, INR in the last 72 hours.  CBG (last 3)   Recent Labs  12/10/16 2007 12/10/16 2345 12/11/16 0357  GLUCAP 124* 87 86    ABG    Component  Value Date/Time   PHART 7.307 (L) 12/08/2016 0612   PCO2ART 42.9 12/08/2016 0612   PO2ART 74.0 (L) 12/08/2016 0612   HCO3 21.5 12/08/2016 0612   TCO2 21 (L) 12/08/2016 1626   ACIDBASEDEF 5.0 (H) 12/08/2016 0612   O2SAT 50.4 12/11/2016 0459    CXR: PORTABLE CHEST 1 VIEW  COMPARISON:  12/10/2016.  FINDINGS: Marked, but stable, enlargement cardiac silhouette. PICC line from RIGHT arm approach lies in the proximal RIGHT atrium, approximately 3 cm below the cavoatrial junction. Swan-Ganz introducer has been removed. BILATERAL chest tubes unchanged, no pneumothorax. BILATERAL pulmonary opacities consistent with congestive heart failure.  IMPRESSION: PICC line has been inserted, lies 3 cm below cavoatrial junction. No pneumothorax. Swan-Ganz introducer removed.  Cardiomegaly with BILATERAL pulmonary opacities consistent congestive heart failure.  Electronically Signed   By: Staci Righter M.D.   On: 12/11/2016 07:49  Assessment/Plan: S/P Procedure(s) (LRB): CORONARY ARTERY BYPASS GRAFTING (CABG) X 2 (N/A) TRANSESOPHAGEAL ECHOCARDIOGRAM (TEE) (N/A) BIOLOGICAL BENTALL AORTIC ROOT REPLACEMENT (N/A) ENDOVEIN HARVEST OF GREATER SAPHENOUS VEIN (Left) THORACIC ASCENDING ANEURYSM REPAIR (AAA) (N/A)  Cardiovascular - Hemodynamically stable this morning. NSR at a rate of 100 bpm. BP 131/87 on 12.5mg  Metoprolol BID and 3 mcg/min Dopamine  Pulmonary - SpO2 93% on RA this morning. Exam reveals crackles in the lung bases. CXR shows pulmonary opacities consistent  with HR. Chest tube drainage slowed, these will be removed today. Encouraged IS and mobilization as tolerated.   Anemia -  Expected in the post-operative period. H&H is 9.0 and 26.0 this morning. Continue to monitor.   Fluid Overload - Expected in the post-operative period. He remains 10kg above post-operative weight. Currently on Lasix 10 mg/Hr infusion and diuresing well. I/O - 3,294.4 in the last 24 hours.   Renal Impairment  - sCr improved to 2.0 this morning. UO 3,460 in the last 24 hours. Will continue to monitor.   Hypokalemia - K+ 3.4 this morning. Urine output is good, so he is likely losing this in his urine through diuresis. May need supplementation.   CBG - 124/87/86 - Continue ACHS glucose checks, SSI prn. Pre-operative HgbA1c was 5.7  Wean dopamine as tolerated D/C Chest Tubes Diuresing well and renal function improving this morning, still 10kg above pre-operative weight Monitor K+ and supplement as needed Mobilize Encourage IS use  Edison Simon, PA-S 12/11/2016 8:26 AM  I have seen and examined the patient and agree with the assessment and plan as outlined.  Looks better.  Renal function recovering and diuresing very well on lasix drip.  Hypokalemia, induced by loop diuretics.  Mobilize.  D/C chest tubes.  Wean dopamine off.  Supplement potassium.  Rexene Alberts, MD 12/11/2016 10:45 AM

## 2016-12-12 ENCOUNTER — Inpatient Hospital Stay (HOSPITAL_COMMUNITY): Payer: Medicare Other

## 2016-12-12 LAB — BASIC METABOLIC PANEL
Anion gap: 13 (ref 5–15)
BUN: 35 mg/dL — AB (ref 6–20)
CALCIUM: 8.2 mg/dL — AB (ref 8.9–10.3)
CO2: 26 mmol/L (ref 22–32)
CREATININE: 1.62 mg/dL — AB (ref 0.61–1.24)
Chloride: 99 mmol/L — ABNORMAL LOW (ref 101–111)
GFR calc Af Amer: 46 mL/min — ABNORMAL LOW (ref >60)
GFR, EST NON AFRICAN AMERICAN: 40 mL/min — AB (ref >60)
GLUCOSE: 111 mg/dL — AB (ref 65–99)
Potassium: 3.5 mmol/L (ref 3.5–5.1)
SODIUM: 138 mmol/L (ref 135–145)

## 2016-12-12 LAB — GLUCOSE, CAPILLARY: Glucose-Capillary: 154 mg/dL — ABNORMAL HIGH (ref 65–99)

## 2016-12-12 LAB — COOXEMETRY PANEL
CARBOXYHEMOGLOBIN: 1.4 % (ref 0.5–1.5)
METHEMOGLOBIN: 1.1 % (ref 0.0–1.5)
O2 SAT: 48.5 %
TOTAL HEMOGLOBIN: 9.7 g/dL — AB (ref 12.0–16.0)

## 2016-12-12 LAB — MAGNESIUM: MAGNESIUM: 1.9 mg/dL (ref 1.7–2.4)

## 2016-12-12 MED ORDER — VITAMIN D 1000 UNITS PO TABS
5000.0000 [IU] | ORAL_TABLET | Freq: Every day | ORAL | Status: DC
  Administered 2016-12-12 – 2016-12-16 (×5): 5000 [IU] via ORAL
  Filled 2016-12-12 (×7): qty 5

## 2016-12-12 MED ORDER — BICALUTAMIDE 50 MG PO TABS
50.0000 mg | ORAL_TABLET | Freq: Every day | ORAL | Status: DC
  Administered 2016-12-12 – 2016-12-16 (×5): 50 mg via ORAL
  Filled 2016-12-12 (×5): qty 1

## 2016-12-12 MED ORDER — POTASSIUM CHLORIDE 10 MEQ/50ML IV SOLN
10.0000 meq | INTRAVENOUS | Status: AC
  Administered 2016-12-12 (×3): 10 meq via INTRAVENOUS
  Filled 2016-12-12 (×3): qty 50

## 2016-12-12 MED ORDER — POLYSACCHARIDE IRON COMPLEX 150 MG PO CAPS
150.0000 mg | ORAL_CAPSULE | Freq: Every day | ORAL | Status: DC
  Administered 2016-12-12 – 2016-12-16 (×5): 150 mg via ORAL
  Filled 2016-12-12 (×6): qty 1

## 2016-12-12 MED ORDER — FUROSEMIDE 40 MG PO TABS
40.0000 mg | ORAL_TABLET | Freq: Every day | ORAL | Status: DC
  Administered 2016-12-12 – 2016-12-16 (×5): 40 mg via ORAL
  Filled 2016-12-12 (×6): qty 1

## 2016-12-12 MED ORDER — FA-PYRIDOXINE-CYANOCOBALAMIN 2.5-25-2 MG PO TABS
1.0000 | ORAL_TABLET | Freq: Every day | ORAL | Status: DC
  Administered 2016-12-12 – 2016-12-16 (×5): 1 via ORAL
  Filled 2016-12-12 (×5): qty 1

## 2016-12-12 MED ORDER — ASPIRIN 325 MG PO TABS
325.0000 mg | ORAL_TABLET | Freq: Every day | ORAL | Status: DC
  Administered 2016-12-12: 325 mg via ORAL
  Filled 2016-12-12 (×2): qty 1

## 2016-12-12 MED ORDER — CARVEDILOL 6.25 MG PO TABS
6.2500 mg | ORAL_TABLET | Freq: Two times a day (BID) | ORAL | Status: DC
  Administered 2016-12-12 – 2016-12-16 (×9): 6.25 mg via ORAL
  Filled 2016-12-12 (×11): qty 1

## 2016-12-12 MED ORDER — ATORVASTATIN CALCIUM 80 MG PO TABS
80.0000 mg | ORAL_TABLET | Freq: Every day | ORAL | Status: DC
  Administered 2016-12-12 – 2016-12-15 (×4): 80 mg via ORAL
  Filled 2016-12-12 (×5): qty 1

## 2016-12-12 MED FILL — Potassium Chloride Inj 2 mEq/ML: INTRAVENOUS | Qty: 40 | Status: AC

## 2016-12-12 MED FILL — Heparin Sodium (Porcine) Inj 1000 Unit/ML: INTRAMUSCULAR | Qty: 30 | Status: AC

## 2016-12-12 MED FILL — Dexmedetomidine HCl in NaCl 0.9% IV Soln 400 MCG/100ML: INTRAVENOUS | Qty: 100 | Status: AC

## 2016-12-12 MED FILL — Magnesium Sulfate Inj 50%: INTRAMUSCULAR | Qty: 10 | Status: AC

## 2016-12-12 NOTE — Progress Notes (Signed)
Potassium 3.5 this AM. Replacing with 3 runs of K+ per protocol.

## 2016-12-12 NOTE — Progress Notes (Addendum)
[address]       [place] [address]             [phone]        CARDIOTHORACIC SURGERY PROGRESS NOTE   R5 Days Post-Op Procedure(s) (LRB): CORONARY ARTERY BYPASS GRAFTING (CABG) X 2 (N/A) TRANSESOPHAGEAL ECHOCARDIOGRAM (TEE) (N/A) BIOLOGICAL BENTALL AORTIC ROOT REPLACEMENT (N/A) ENDOVEIN HARVEST OF GREATER SAPHENOUS VEIN (Left) THORACIC ASCENDING ANEURYSM REPAIR (AAA) (N/A)  Subjective: Feeling good this morning. Alert and awake in chair. Notes "I am getting tired of peeing." No complaints of CP or SOB.   Objective: Vital signs: BP Readings from Last 1 Encounters:  12/12/16 112/81   Pulse Readings from Last 1 Encounters:  12/12/16 86   Resp Readings from Last 1 Encounters:  12/12/16 14   Temp Readings from Last 1 Encounters:  12/12/16 97.9 F (36.6 C) (Oral)    Hemodynamics:    Physical Exam:  Rhythm:   NSR at 88  Breath sounds: Diminished but without crackles this morning   Heart sounds:  RRR, no m/r/g  Incisions:  Median sternotomy incision is clean, dry, intact, and without erythema or drainage  Abdomen:  Soft, non-tender  Extremities:  1+ pitting edema bilateral LE, 2+ DP/PT pulses bilaterally   Intake/Output from previous day: 10/02 0701 - 10/03 0700 In: 1517.2 [P.O.:950; I.V.:517.2; IV Piggyback:50] Out: 1017 [Urine:3600; Chest Tube:120] Intake/Output this shift: Total I/O In: 50 [IV Piggyback:50] Out: -   Lab Results:  CBC: Recent Labs  12/10/16 0405 12/11/16 0444  WBC 8.6 9.4  HGB 8.7* 9.0*  HCT 26.1* 26.0*  PLT 102* 135*    BMET:  Recent Labs  12/11/16 0444 12/12/16 0318  NA 135 138  K 3.4* 3.5  CL 102 99*  CO2 24 26  GLUCOSE 119* 111*  BUN 35* 35*  CREATININE 2.00* 1.62*  CALCIUM 7.3* 8.2*     PT/INR:  No results for input(s): LABPROT, INR in the last 72 hours.  CBG (last 3)   Recent Labs  12/11/16 0819 12/11/16 1200 12/11/16 1617  GLUCAP 80 115* 97    ABG    Component Value  Date/Time   PHART 7.307 (L) 12/08/2016 0612   PCO2ART 42.9 12/08/2016 0612   PO2ART 74.0 (L) 12/08/2016 0612   HCO3 21.5 12/08/2016 0612   TCO2 21 (L) 12/08/2016 1626   ACIDBASEDEF 5.0 (H) 12/08/2016 0612   O2SAT 48.5 12/12/2016 0330    CXR: CHEST  2 VIEW  COMPARISON:  Portable chest x-ray of December 11, 2016  FINDINGS: The lungs are reasonably well inflated. The interstitial markings have increased greatly. There is a persistent area of subsegmental atelectasis at the left lung base there is parenchymal density at the site of the previous left chest tube. The left chest tube has been removed. There is a trace of pleural fluid on the right. The cardiac silhouette remains enlarged. The pulmonary vascularity is not engorged. The right-sided PICC line tip projects over the distal third of the SVC.  IMPRESSION: Improved appearance of the pulmonary interstitium bilaterally. Persistent but improving left basilar subsegmental atelectasis.   Electronically Signed   By: David  Martinique M.D.   On: 12/12/2016 07:32  Assessment/Plan: S/P Procedure(s) (LRB): CORONARY ARTERY BYPASS GRAFTING (CABG) X 2 (N/A) TRANSESOPHAGEAL ECHOCARDIOGRAM (TEE) (N/A) BIOLOGICAL BENTALL AORTIC ROOT REPLACEMENT (N/A) ENDOVEIN HARVEST OF GREATER SAPHENOUS VEIN (Left) THORACIC ASCENDING ANEURYSM REPAIR (AAA) (N/A)  Cardiovascular - Hemodynamically stable this morning. NSR at a rate of 188 bpm.  BP 112/80 on 12.5mg  Metoprolol BID. No longer requiring drips  Pulmonary - SpO2 100% on RA this morning. Lung sounds are diminished but without crackles this morning. CXR shows improvement in interstitial pulmonary appearance . Encouraged IS and mobilization as tolerated.   Anemia -  Expected in the post-operative period. Latest H&H is 9.0 and 26.0. Continue to monitor.   Fluid Overload - Expected in the post-operative period. He remains 3kg above post-operative weight. Currently on Lasix 10 mg/Hr infusion and  diuresing well, this may be able to be titrated down and transition to PO medications soon. I/O - 2,202.8 in the last 24 hours.   Renal Impairment - sCr improved to 1.62 this morning. UO 3,600 in the last 24 hours. Will continue to monitor.   Hypokalemia - K+ improved to 3.5 this morning. Urine output is good, so he is likely losing this in his urine through diuresis. Will likely continue to need supplementation.   CBG - 80/115/97 - Continue ACHS glucose checks, SSI prn. Pre-operative HgbA1c was 5.7  Diuresing well and renal function improving this morning, still 3kg above pre-operative weight Monitor K+ and supplement as needed Mobilize Encourage IS use  Edison Simon, PA-S 12/12/2016 8:11 AM  I have seen and examined the patient and agree with the assessment and plan as outlined.  Making good progress.  Will stop lasix drip and switch to intermittent dosing.  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  Supplement potassium.  Mobilize.  Transfer step down.  Rexene Alberts, MD 12/12/2016 8:25 AM

## 2016-12-12 NOTE — Progress Notes (Signed)
2040- Pt BP 75/50. Check 2x automatic, and once manually. On call CT surgeon notified. No new orders given. Will continue to monitor.  Raliegh Ip RN

## 2016-12-13 LAB — BASIC METABOLIC PANEL
ANION GAP: 10 (ref 5–15)
BUN: 39 mg/dL — AB (ref 6–20)
CALCIUM: 8.3 mg/dL — AB (ref 8.9–10.3)
CO2: 30 mmol/L (ref 22–32)
Chloride: 99 mmol/L — ABNORMAL LOW (ref 101–111)
Creatinine, Ser: 1.53 mg/dL — ABNORMAL HIGH (ref 0.61–1.24)
GFR calc Af Amer: 50 mL/min — ABNORMAL LOW (ref >60)
GFR calc non Af Amer: 43 mL/min — ABNORMAL LOW (ref >60)
GLUCOSE: 114 mg/dL — AB (ref 65–99)
Potassium: 4 mmol/L (ref 3.5–5.1)
Sodium: 139 mmol/L (ref 135–145)

## 2016-12-13 LAB — CBC
HEMATOCRIT: 25.9 % — AB (ref 39.0–52.0)
Hemoglobin: 8.8 g/dL — ABNORMAL LOW (ref 13.0–17.0)
MCH: 30.2 pg (ref 26.0–34.0)
MCHC: 34 g/dL (ref 30.0–36.0)
MCV: 89 fL (ref 78.0–100.0)
PLATELETS: 182 10*3/uL (ref 150–400)
RBC: 2.91 MIL/uL — ABNORMAL LOW (ref 4.22–5.81)
RDW: 15.3 % (ref 11.5–15.5)
WBC: 8.6 10*3/uL (ref 4.0–10.5)

## 2016-12-13 LAB — COOXEMETRY PANEL

## 2016-12-13 MED ORDER — ASPIRIN EC 81 MG PO TBEC
81.0000 mg | DELAYED_RELEASE_TABLET | Freq: Every day | ORAL | Status: DC
  Administered 2016-12-13 – 2016-12-16 (×4): 81 mg via ORAL
  Filled 2016-12-13 (×4): qty 1

## 2016-12-13 MED ORDER — CLOPIDOGREL BISULFATE 75 MG PO TABS
75.0000 mg | ORAL_TABLET | Freq: Every day | ORAL | Status: DC
  Administered 2016-12-13 – 2016-12-16 (×4): 75 mg via ORAL
  Filled 2016-12-13 (×4): qty 1

## 2016-12-13 NOTE — Care Management Note (Signed)
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                   Expected Discharge Date:                  Expected Discharge Plan:     In-House Referral:     Discharge planning Services  CM Consult  Post Acute Care Choice:    Choice offered to:     DME Arranged:    DME Agency:     HH Arranged:    HH Agency:     Status of Service:  In process, will continue to follow  If discussed at Long Length of Stay Meetings, dates discussed:    Discharge Disposition:   Additional Comments:  12/13/16- 1145- Marvetta Gibbons RN, CM- pt s/p [place] and CABG x2-  Plan for EPW to be pulled today, possible d/c 2-3 days- plan to return home with wife-   Dawayne Patricia, RN 12/13/2016, 11:46 AM

## 2016-12-13 NOTE — Care Management Important Message (Signed)
Important Message  Patient Details  Name: Kevin Burgess MRN: [id number] Date of Birth: March 24, 1940   Medicare Important Message Given:  Yes    Nathen May 12/13/2016, 10:42 AM

## 2016-12-13 NOTE — Discharge Summary (Signed)
Physician Discharge Summary       [address]       ,[place] [address]             [phone]    Patient ID: Kevin Burgess MRN: [id number] DOB/AGE: 75-26-1942 76 y.o.  Admit date: 12/04/2016 Discharge date: 12/16/2016  Admission Diagnoses: 1. NSTEMI (non-ST elevated myocardial infarction) ([place]) 2. Coronary artery disease 3. Ascending thoracic aortic aneurysm 4. Moderate aortic insufficiency  Active Diagnoses:  1. Stenosis of right subclavian artery ([place]) 2. Left carotid artery occlusion 3. Mild right carotid artery stenosis, asymptomatic 4. History of hiatal hernia 5. OA (osteoarthritis) 6. Prostate cancer ([place]) 7. CKD 8. ABL anemia    Consults: vascular surgery  Procedure (s):   LEFT HEART CATH AND CORONARY ANGIOGRAPHY by Dr. Saunders Revel on 12/04/2016:  Conclusion   Conclusions: 1. Severe three-vessel coronary artery disease, as detailed below. 2. Moderately reduced left ventricular contraction. 3. Upper normal to mildly elevated left ventricular filling pressure.  Recommendations: 1. Transfer to [facility] for surgical consultation for CABG. 2. Restart heparin infusion 2 hours after TR band removal. 3. Continue blood pressure control and aggressive secondary prevention with high intensity statin.  Nelva Bush, MD [facility] Pager: [phone]   Final result                              *[facility]*                         [address], [place] [address]                            [phone]  ------------------------------------------------------------------- Transthoracic Echocardiography  Patient:    Kevin, Burgess MR #:       [id number] Study Date: 12/06/2016 Gender:     M Age:        76 Height:     170.2 cm Weight:     70.5 kg BSA:        1.83 m^2 Pt. Status: Room:       4E26C   ORDERING     Darylene Price, M.D.  REFERRING     Darylene Price, M.D.  ADMITTING    Minus Breeding, MD  ATTENDING    Nelva Bush, MD  PERFORMING   [facility], Inpatient  SONOGRAPHER  Roseanna Rainbow  cc:  ------------------------------------------------------------------- LV EF: 40%  ------------------------------------------------------------------- Indications:      MI - acute 410.91.  ------------------------------------------------------------------- History:   PMH:  No prior cardiac history.  ------------------------------------------------------------------- Study Conclusions  - Left ventricle: The cavity size was normal. Wall thickness was   increased in a pattern of mild LVH. The estimated ejection   fraction was 40%. Diffuse hypokinesis. Features are consistent   with a pseudonormal left ventricular filling pattern, with   concomitant abnormal relaxation and increased filling pressure   (grade 2 diastolic dysfunction). - Aortic valve: Trileaflet; mildly calcified leaflets. There was   moderate regurgitation. - Aorta: Dilated ascending aorta. Ascending aortic diameter: 45 mm   (S). - Mitral valve: Mildly calcified annulus. There was mild  regurgitation. - Left atrium: The atrium was mildly dilated. - Right ventricle: The cavity size was normal. Systolic function   was normal. - Tricuspid valve: Peak RV-RA gradient (S): 46 mm Hg. - Pulmonary arteries: PA peak pressure: 49 mm Hg (S). - Inferior vena cava: The vessel was normal in size. The   respirophasic diameter changes were in the normal range (>= 50%),   consistent with normal central venous pressure.  Impressions:  - Normal LV size with mild LV hypertrophy. EF 40%, diffuse   hypokinesis. Moderate diastolic dysfunction. Dilated ascending   aorta with moderate aortic insufficiency (trileaflet aortic   valve). Normal RV size and systolic function. Mild to moderate   pulmonary  hypertension.  ------------------------------------------------------------------- Study data:  No prior study was available for comparison.  Study status:  Routine.  Procedure:  The patient reported no pain pre or post test. Transthoracic echocardiography. Image quality was adequate.  Study completion:  There were no complications. Transthoracic echocardiography.  M-mode, complete 2D, spectral Doppler, and color Doppler.  Birthdate:  Patient birthdate: 05-21-1940.  Age:  Patient is 76 yr old.  Sex:  Gender: male. BMI: 24.3 kg/m^2.  Blood pressure:     150/73  Patient status: Inpatient.  Study date:  Study date: 12/06/2016. Study time: 07:44 AM.  Location:  ICU/CCU  -------------------------------------------------------------------  ------------------------------------------------------------------- Left ventricle:  The cavity size was normal. Wall thickness was increased in a pattern of mild LVH. The estimated ejection fraction was 40%. Diffuse hypokinesis. Features are consistent with a pseudonormal left ventricular filling pattern, with concomitant abnormal relaxation and increased filling pressure (grade 2 diastolic dysfunction).  ------------------------------------------------------------------- Aortic valve:   Trileaflet; mildly calcified leaflets.  Doppler: There was no stenosis.   There was moderate regurgitation.  ------------------------------------------------------------------- Aorta:  Dilated ascending aorta.  ------------------------------------------------------------------- Mitral valve:   Mildly calcified annulus.  Doppler:   There was no evidence for stenosis.   There was mild regurgitation.    Peak gradient (D): 6 mm Hg.  ------------------------------------------------------------------- Left atrium:  The atrium was mildly dilated.  ------------------------------------------------------------------- Right ventricle:  The cavity size was normal.  Systolic function was normal.  ------------------------------------------------------------------- Pulmonic valve:    Structurally normal valve.   Cusp separation was normal.  Doppler:  Transvalvular velocity was within the normal range. There was trivial regurgitation.  ------------------------------------------------------------------- Tricuspid valve:   Doppler:  There was mild regurgitation.  ------------------------------------------------------------------- Right atrium:  The atrium was normal in size.  ------------------------------------------------------------------- Pericardium:  There was no pericardial effusion.  ------------------------------------------------------------------- Systemic veins: Inferior vena cava: The vessel was normal in size. The respirophasic diameter changes were in the normal range (>= 50%), consistent with normal central venous pressure.  ------------------------------------------------------------------- Post procedure conclusions Ascending Aorta:  - Dilated ascending aorta.      Biological Bentall Aortic Root Replacement             Edwards Magna Ease Pericardial Tissue Valve (size 65mm, model # 3300TFX, serial # [id number])             [place] Valsalva Aortic Root Graft (size 24 mm, serial # [id number])             Reimplantation of Left Main Coronary Artery   Repair of Ascending Thoracic Aortic Aneurysm             [place] Platinum Straignt Graft (size 30 mm, serial # [id number])   Coronary Artery Bypass Grafting x 2             Left Internal Mammary Artery  to Second Diagonal Branch of Left Anterior Descending Coronary Artery             Saphenous Vein Graft to Posterior Descending Coronary Artery             Endoscopic Vein Harvest from Left Thigh by Dr. Roxy Manns on 09/28/20018.  History of Presenting Illness: Patient is a 76 year old male with no previous history of coronary artery disease and risk factors notable  primarily for that of hyperlipidemia who was [facility] 12/03/2016 with acute systolic congestive heart failure and non-ST segment elevation myocardial infarction and was transferred to [facility] for surgical consultation to discuss treatment options for management of severe three-vessel coronary artery disease. The patient has been fairly healthy for most of his life and physically active. He notes that over the last several weeks or more he has developed exertional shortness of breath and occasional episodes of epigastric chest pain which typically were postprandial and attributed to possible dyspepsia. On 12/03/2016 he developed severe epigastric chest pain associated with shortness of breath after eating supper.  Symptoms persisted for more than 2 hours prompting the patient to be seen emergently at [facility] where the patient was notably hypertensive and chest x-ray revealed pulmonary edema.  Troponin levels were mildly elevated at 0.065. He was treated with sublingual nitroglycerin and symptoms probably resolved. He was transferred to [facility] and follow-up troponin levels rose further to 0.59. The patient subsequently underwent diagnostic cardiac catheterization by Dr. Saunders Revel and was found to have severe three-vessel coronary artery disease with preserved left ventricular systolic function. The patient was transferred to [facility] for further management.  He has not had any recurrent episodes of epigastric chest pain nor resting shortness of breath since admission.  Patient is married and lives with his wife near [place], [place]. 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 He has been physically active and functionally independent all of his life and has been working up until immediately prior to admission. He has been having problems with pain in his right hip for the past year or more, and he was  recently evaluated for possible hip replacement. He admits to exertional shortness of breath over the last several weeks or more. He has been slowing down a fair amount. He denies any resting shortness of breath, PND, orthopnea, or lower extremity edema. He has had some dizzy spells without syncope. Despite the pain in his hip he is still ambulatory and he does not require a cane or any other assistance for mobility.  Patient has severe three-vessel coronary artery disease with preserved left ventricular systolic function. He presents with acute coronary syndrome and has ruled in for an acute non-ST segment elevation myocardial infarction. He has remained pain free on medical therapy since hospitalization. Dr. Roxy Manns personally reviewed the patient's diagnostic cardiac catheterization. He has severe three-vessel coronary artery disease with subtotal occlusion of the proximal right coronary artery and high-grade proximal disease involving the both left anterior descending and the left circumflex coronary distributions. Left ventricular systolic function remains preserved. Dr. Roxy Manns agreed the patient needs surgical revascularization. Risks associated with surgery were discussed with the patient and he agreed to proceed with surgery. Pre operative carotid duplex US showed 60-79% right internal carotid artery stenosis and a 80-99% left internal carotid artery stenosis. A vascular consult was obtained. Per Dr. Bridgett Larsson, the left internal carotid artery appeared to be chronically occluded and the right  internal carotid artery appeared adequately perfused with multiple segments with <50% disease.  He underwent a CABG x 2, resection of ATAA, and Bentall on 12/07/2016.  Brief Hospital Course:  The patient was extubated the evening of surgery without difficulty. He/she remained afebrile and hemodynamically stable. He was weaned off of  Milrinone drip. He did remain on Dopamine drip for several days post op and as renal  function improved, it too was weaned off (on 10/02). Gordy Councilman, a line and foley were removed early in the post operative course. Chest tubes remained for a few days and then were removed on 10/02.Lopressor was started and titrated accordingly. He was volume over loaded and diuresed. He had ABL anemia. He did not require a post op transfusion. Last H and H was 8.1 and 25.2. He was weaned off the insulin drip.The patient's glucose remained well controlled.The patient's HGA1C pre op was 5.7. He is likely pre diabetic and will require further surveillance as an outpatient. Instructions on diet have been included in his discharge paperwork. He has a history of CKD and did have mildly elevated creatinine post op. His creatinine upon admission was 1.26 and his last creatinine was down to 1.33. The patient was felt surgically stable for transfer from the ICU to [facility] for further convalescence on 12/11/2016. He continues to progress with cardiac rehab. He was ambulating on 2 liters of oxygen via [place]. He does desat with ambulation (84% on room air) so he will need oxygen at discharge.  He has been tolerating a diet and has had a bowel movement. Epicardial pacing wires have been removed. As discussed with Dr. Prescott Gum, will continue with Lasix for 5 more days after discharge then stop. He did have a few runs of SVT on 10/06. He has not had any further run of SVT.  He did develop a rash on his back and legs. ?etiology but was given Triamcinolone cream which helped. Chest tube sutures were removed the day of discharge. Home health and home PT will be arranged. As discussed with Dr. Prescott Gum, the patient is felt surgically stable for discharge today.    Latest Vital Signs: Blood pressure 98/64, pulse 69, temperature 98.1 F (36.7 C), temperature source Oral, resp. rate 15, height 5\' 7"  (1.702 m), weight 158 lb 9.6 oz (71.9 kg), SpO2 96 %.  Physical Exam: Rhythm:                       RRR                    Breath  sounds:            Diminished bilaterally             Heart sounds:              RRR, no m/r/g             Incisions:                     Median sternotomy incision is clean, dry, and intact without erythema or drainage             Abdomen:                    Soft, non-tender             Extremities:  Trace edema to the lower extremities, 2+ DP/PT pulses  Discharge Condition:Stable and discharged to home.  Recent laboratory studies:  Lab Results  Component Value Date   WBC 11.2 (H) 12/16/2016   HGB 8.3 (L) 12/16/2016   HCT 25.4 (L) 12/16/2016   MCV 92.0 12/16/2016   PLT 224 12/16/2016   Lab Results  Component Value Date   NA 137 12/16/2016   K 4.7 12/16/2016   CL 99 (L) 12/16/2016   CO2 28 12/16/2016   CREATININE 1.33 (H) 12/16/2016   GLUCOSE 100 (H) 12/16/2016    Diagnostic Studies: Dg Chest 2 View  Result Date: 12/12/2016 CLINICAL DATA:  Status post thoracic aortic aneurysm repair as well than tall procedure and coronary artery bypass grafting on December 07, 2016 EXAM: CHEST  2 VIEW COMPARISON:  Portable chest x-ray of December 11, 2016 FINDINGS: The lungs are reasonably well inflated. The interstitial markings have increased greatly. There is a persistent area of subsegmental atelectasis at the left lung base there is parenchymal density at the site of the previous left chest tube. The left chest tube has been removed. There is a trace of pleural fluid on the right. The cardiac silhouette remains enlarged. The pulmonary vascularity is not engorged. The right-sided PICC line tip projects over the distal third of the SVC. IMPRESSION: Improved appearance of the pulmonary interstitium bilaterally. Persistent but improving left basilar subsegmental atelectasis. Electronically Signed   By: David  Martinique M.D.   On: 12/12/2016 07:32    Ct Angio Neck W Or Wo Contrast  Result Date: 12/06/2016 CLINICAL DATA:  Followup carotid artery disease. Outside carotid ultrasound report  describes proximal LEFT ICA severe stenosis. EXAM: CT ANGIOGRAPHY NECK TECHNIQUE: Multidetector CT imaging of the neck was performed using the standard protocol during bolus administration of intravenous contrast. Multiplanar CT image reconstructions and MIPs were obtained to evaluate the vascular anatomy. Carotid stenosis measurements (when applicable) are obtained utilizing NASCET criteria, using the distal internal carotid diameter as the denominator. CONTRAST:  50 cc Isovue 370 COMPARISON:  None. 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 Intimal thickening and calcific atherosclerosis resulting in severe stenosis RIGHT subclavian artery origin. RIGHT CAROTID SYSTEM: Common carotid artery is widely patent, mild eccentric intimal thickening. Eccentric intimal thickening calcific atherosclerosis results in less than 5 mm segment of 60% stenosis. Patent internal carotid artery. LEFT CAROTID SYSTEM: Gradual loss of contrast opacification LEFT Common carotid artery. Intimal thickening. Poor contrast opacification LEFT carotid bifurcation. Severe calcific atherosclerosis, occluded LEFT internal carotid artery origin, no appreciable contrast opacification LEFT internal carotid artery. VERTEBRAL ARTERIES:Severe stenosis bilateral vertebral artery origins. Codominant vertebral artery's. SKELETON: No acute osseous process though bone windows have not been submitted. Moderate to severe cervical spondylosis and moderate facet arthropathy. Severe bilateral C3-4, RIGHT C4-5, bilateral C5-6 neural foraminal narrowing. OTHER NECK: Soft tissues of the neck are nonacute though, not tailored for evaluation. UPPER CHEST: Small pleural effusions. No superior mediastinal lymphadenopathy. IMPRESSION: 1. Occluded LEFT internal carotid artery. 2. 60% stenosis RIGHT internal carotid artery origin. 3. Severe stenosis RIGHT subclavian artery origin. Severe stenosis  bilateral vertebral artery origins. 4. Multilevel severe neural foraminal narrowing. 5. **An incidental finding of potential clinical significance has been found. 4.7 cm ascending aortic aneurysm. Recommend semi-annual imaging followup by CTA or MRA and referral to cardiothoracic surgery if not already obtained. This recommendation follows 2010 ACCF/AHA/AATS/ACR/ASA/SCA/SCAI/SIR/STS/SVM Guidelines for the Diagnosis and Management of Patients With Thoracic Aortic Disease. Circulation. 2010;  121: [phone]** 6. These results will be called to the ordering clinician or representative by the Radiologist Assistant, and communication documented in the PACS or zVision Dashboard. Aortic Atherosclerosis (ICD10-I70.0). Electronically Signed   By: Elon Alas M.D.   On: 12/06/2016 19:09   [place] Retroperitoneal Comp  Result Date: 12/04/2016 CLINICAL DATA:  Abdominal pain. EXAM: ULTRASOUND RETROPERITONEAL COMPLETE TECHNIQUE: Ultrasound examination of the abdominal aorta was performed to evaluate for abdominal aortic aneurysm. The common iliac arteries, IVC, and kidneys were also evaluated. COMPARISON:  None. FINDINGS: Abdominal Aorta No aneurysm identified. Maximum AP Diameter:  2.3 cm. Maximum TRV Diameter: 2.0 cm. Right Common Iliac Artery No aneurysm identified. Left Common Iliac Artery No aneurysm identified. IVC No abnormality visualized. Right Kidney Length: 9.3 cm Echogenicity within normal limits. No mass or hydronephrosis visualized. Left Kidney Length: 9 cm Echogenicity within normal limits. No mass or hydronephrosis visualized. IMPRESSION: No definite abnormality seen in the visualized retroperitoneum. No abdominal aortic aneurysm is noted. Normal kidneys. Electronically Signed   By: Marijo Conception, M.D.   On: 12/04/2016 10:34     Discharge Medications: Allergies as of 12/16/2016   No Known Allergies     Medication List    STOP taking these medications   heparin 100-0.45 UNIT/ML-% infusion    ibuprofen 800 MG tablet Commonly known as:  ADVIL,MOTRIN   nitroGLYCERIN 2 % ointment Commonly known as:  NITROGLYN     TAKE these medications   alendronate 70 MG tablet Commonly known as:  FOSAMAX Take 70 mg by mouth every Thursday.   aspirin 81 MG chewable tablet Chew 1 tablet (81 mg total) by mouth daily.   atorvastatin 80 MG tablet Commonly known as:  LIPITOR Take 1 tablet (80 mg total) by mouth daily at 6 PM.   bicalutamide 50 MG tablet Commonly known as:  CASODEX Take 50 mg by mouth daily.   carvedilol 6.25 MG tablet Commonly known as:  COREG Take 1 tablet (6.25 mg total) by mouth 2 (two) times daily with a meal.   clopidogrel 75 MG tablet Commonly known as:  PLAVIX Take 1 tablet (75 mg total) by mouth daily.   ferrous sulfate 325 (65 FE) MG tablet Take 1 tablet (325 mg total) by mouth daily. For one month then stop.   furosemide 40 MG tablet Commonly known as:  LASIX Take 1 tablet (40 mg total) by mouth daily. For 5 days then stop.   omeprazole 20 MG capsule Commonly known as:  PRILOSEC Take 20 mg by mouth daily.   traMADol 50 MG tablet Commonly known as:  ULTRAM Take 50 mg by mouth every 4-6 hours PRN severe pain.   triamcinolone cream 0.5 % Commonly known as:  KENALOG Apply topically 2 (two) times daily.   Vitamin D-3 5000 units Tabs Take 5,000 Units by mouth daily.            Durable Medical Equipment        Start     Ordered   12/16/16 1035  For home use only DME oxygen  Once    Question Answer Comment  Mode or (Route) Nasal cannula   Liters per Minute 2   Frequency Continuous (stationary and portable oxygen unit needed)   Oxygen delivery system Gas      12/16/16 1034     The patient has been discharged on:   1.Beta Blocker:  Yes [ x  ]  No   [   ]                              If No, reason:  2.Ace Inhibitor/ARB: Yes [   ]                                     No  [ x   ]                                      If No, reason:Elevated creatinine  3.Statin:   Yes [ x  ]                  No  [   ]                  If No, reason:  4.Ecasa:  Yes  [ x  ]                  No   [   ]                  If No, reason:  Follow Up Appointments: Follow-up Information    Rexene Alberts, MD. Go on 01/14/2017.   Specialty:  Cardiothoracic Surgery Why:  PA/LAT CXR to be taken (at [facility] which is in the same building as Dr. Guy Sandifer office, on the ground floor) on 01/14/2017 at 12:00 pm;Appointment time is at 12:30 pm Contact information: [place] [address] [phone]        [name], MD. Call.   Specialty:  Family Medicine Why:  for an appointment regarding further surveillance of HGA1C 5.7 (pre diabetes) Contact information: [place] [address] [phone]        Nelva Bush, MD. Go on 12/26/2016.   Specialty:  Cardiology Why:  Appointment time is at 4:20 pm. Please call office to confirm this appointment. Contact information: [address] [place] [address] [phone]           Signed: Lars Pinks MPA-C 12/16/2016, 10:51 AM

## 2016-12-13 NOTE — Progress Notes (Signed)
[address]       [place] [address]             [phone]     CARDIOTHORACIC SURGERY PROGRESS NOTE  6 Days Post-Op  S/P Procedure(s) (LRB): CORONARY ARTERY BYPASS GRAFTING (CABG) X 2 (N/A) TRANSESOPHAGEAL ECHOCARDIOGRAM (TEE) (N/A) BIOLOGICAL BENTALL AORTIC ROOT REPLACEMENT (N/A) ENDOVEIN HARVEST OF GREATER SAPHENOUS VEIN (Left) THORACIC ASCENDING ANEURYSM REPAIR (AAA) (N/A)  Subjective: Feeling good this morning, alert and awake in bed. No complaints of CP or SOB.   Objective: Vital signs in last 24 hours: Temp:  [97.3 F (36.3 C)-98.7 F (37.1 C)] 98.7 F (37.1 C) (10/04 0521) Pulse Rate:  [76-85] 85 (10/04 0521) Cardiac Rhythm: Normal sinus rhythm (10/03 1900) Resp:  [12-25] 20 (10/04 0521) BP: (71-120)/(43-86) 107/43 (10/04 0521) SpO2:  [91 %-100 %] 97 % (10/04 0521) Weight:  [159 lb (72.1 kg)] 159 lb (72.1 kg) (10/04 0521)  Physical Exam:  Rhythm:   NSR at 88   Breath sounds: Diminished bilaterally  Heart sounds:  RRR, no m/r/g  Incisions:  Median sternotomy incision is clean, dry, and intact without erythema or drainage  Abdomen:  Soft, non-tender  Extremities:  Trace edema to the lower extremities, 2+ DP/PT pulses   Intake/Output from previous day: 10/03 0701 - 10/04 0700 In: 870 [P.O.:610; I.V.:160; IV Piggyback:100] Out: 300 [Urine:300] Intake/Output this shift: No intake/output data recorded.  Lab Results:  Recent Labs  12/11/16 0444 12/13/16 0503  WBC 9.4 8.6  HGB 9.0* 8.8*  HCT 26.0* 25.9*  PLT 135* 182   BMET:  Recent Labs  12/12/16 0318 12/13/16 0503  NA 138 139  K 3.5 4.0  CL 99* 99*  CO2 26 30  GLUCOSE 111* 114*  BUN 35* 39*  CREATININE 1.62* 1.53*  CALCIUM 8.2* 8.3*    CBG (last 3)   Recent Labs  12/11/16 1200 12/11/16 1617 12/11/16 2009  GLUCAP 115* 97 154*   PT/INR:  No results for input(s): LABPROT, INR in the last 72 hours.  CXR:  N/A  Assessment/Plan: S/P Procedure(s)  (LRB): CORONARY ARTERY BYPASS GRAFTING (CABG) X 2 (N/A) TRANSESOPHAGEAL ECHOCARDIOGRAM (TEE) (N/A) BIOLOGICAL BENTALL AORTIC ROOT REPLACEMENT (N/A) ENDOVEIN HARVEST OF GREATER SAPHENOUS VEIN (Left) THORACIC ASCENDING ANEURYSM REPAIR (AAA) (N/A)  Cardiovascular - Hemodynamically stable this morning. NSR at a rate of 88 bpm. BP 107/43 on 12.5mg  Metoprolol BID and 6.25 mg Carvedilol BID.   Pulmonary - SpO2 98% on RA this morning. Lung sounds are diminished but without crackles this morning. Encouraged IS and mobilization as tolerated.   Anemia - Expected in the post-operative period. Latest H&H is 8.8 and 25.9. Continue to monitor.   Fluid Overload- Expected in the post-operative period. He remains 2kg above post-operative weight. Currently on 40 mg Lasix daily PO. I/O +570 in the last 24 hours.   Renal Impairment - sCr improved to 1.53 this morning, does have CKD at baseline. UO measure and charted for yesterday morning only was 300. Will continue to monitor.   Hypokalemia -K+ improved to 4.0 this morning. May continue to lose this in his urine through diuresis. Will likely continue to need supplementation.   CBG - Pre-operative HgbA1c was 5.7, so ACHS glucose check can stop while on floor   Diuresing well and renal function improving this morning, still 2kg above pre-operative weight Monitor K+ and supplement as needed Mobilize Encourage IS use May be ready for discharge in 2-3 days   Edison Simon,  PA-S 12/13/2016 7:30 AM  I have seen and examined the patient and agree with the assessment and plan as outlined.  Making excellent progress.  Rexene Alberts, MD 12/13/2016 8:53 AM

## 2016-12-13 NOTE — Progress Notes (Addendum)
CARDIAC REHAB PHASE I   PRE:  Rate/Rhythm: 88 SR  BP:  Sitting: 104/67       SaO2: 97 2L   MODE:  Ambulation: 160 ft   POST:  Rate/Rhythm: 94 SR  BP:  Sitting: 128/76         SaO2: 93 2L  Pt in bed, required max assist to move from lying to sitting position, assist x2 to stand. Pt relies heavily on arms for mobility, has difficulty using lower body for mobility. Pt ambulated 160 ft on 2L O2, rolling walker, gait belt, assist x1, slow, mostly steady gait, tolerated fairly well. Pt c/o generalized weakness, fatigue with distance, DOE, standing rest x1. Pt to bed per pt request after walk (required assist x2 back to bed), call bell within reach. Encouraged IS, additional ambulation x1 today. Pt will benefit from PT to maximize mobility. Will follow.   [id number] Lenna Sciara, RN, BSN 12/13/2016 2:58 PM

## 2016-12-14 ENCOUNTER — Telehealth: Payer: Self-pay | Admitting: *Deleted

## 2016-12-14 LAB — BASIC METABOLIC PANEL
ANION GAP: 6 (ref 5–15)
BUN: 36 mg/dL — ABNORMAL HIGH (ref 6–20)
CALCIUM: 8.1 mg/dL — AB (ref 8.9–10.3)
CO2: 28 mmol/L (ref 22–32)
CREATININE: 1.39 mg/dL — AB (ref 0.61–1.24)
Chloride: 100 mmol/L — ABNORMAL LOW (ref 101–111)
GFR, EST AFRICAN AMERICAN: 56 mL/min — AB (ref >60)
GFR, EST NON AFRICAN AMERICAN: 48 mL/min — AB (ref >60)
Glucose, Bld: 102 mg/dL — ABNORMAL HIGH (ref 65–99)
Potassium: 4 mmol/L (ref 3.5–5.1)
SODIUM: 134 mmol/L — AB (ref 135–145)

## 2016-12-14 LAB — CBC
HEMATOCRIT: 25.2 % — AB (ref 39.0–52.0)
Hemoglobin: 8.1 g/dL — ABNORMAL LOW (ref 13.0–17.0)
MCH: 28.9 pg (ref 26.0–34.0)
MCHC: 32.1 g/dL (ref 30.0–36.0)
MCV: 90 fL (ref 78.0–100.0)
Platelets: 196 10*3/uL (ref 150–400)
RBC: 2.8 MIL/uL — ABNORMAL LOW (ref 4.22–5.81)
RDW: 14.9 % (ref 11.5–15.5)
WBC: 8.1 10*3/uL (ref 4.0–10.5)

## 2016-12-14 NOTE — Progress Notes (Addendum)
Kevin Burgess,Kevin Burgess ROOT REPLACEMENT (N/A) ENDOVEIN HARVEST OF GREATER SAPHENOUS VEIN (Left) THORACIC ASCENDING ANEURYSM REPAIR (AAA) (N/A)  Subjective: Feeling good this morning. Alert and awake in his bed. No complaints of CP or SOB.   Objective: Vital signs in last 24 hours: Temp:  [98.1 F (36.7 C)-99 F (37.2 C)] 98.1 F (36.7 C) (10/05 0445) Pulse Rate:  [72-87] 82 (10/05 0445) Cardiac Rhythm: Normal sinus rhythm (10/05 0445) Resp:  [15-22] 22 (10/05 0445) BP: (82-131)/(56-90) 106/68 (10/05 0445) SpO2:  [94 %-99 %] 94 % (10/05 0445) Weight:  [158 lb (71.7 kg)] 158 lb (71.7 kg) (10/05 0445)  Physical Exam:    Rhythm:                       NSR at 88                    Breath sounds:            Diminished bilaterally             Heart sounds:              RRR, no m/r/g             Incisions:                     Median sternotomy incision is clean, dry, and intact without erythema or drainage             Abdomen:                    Soft, non-tender             Extremities:                 Trace edema to the lower extremities, 2+ DP/PT pulses   Intake/Output from previous day: 10/04 0701 - 10/05 0700 In: 240 [P.O.:240] Out: 1200 [Urine:1200] Intake/Output this shift: No intake/output data recorded.  Lab Results:  Recent Labs  12/13/16 0503 12/14/16 0346  WBC 8.6 8.1  HGB 8.8* 8.1*  HCT 25.9* 25.2*  PLT 182 196   BMET:  Recent Labs  12/13/16 0503 12/14/16 0346  NA 139 134*  K 4.0 4.0  CL 99* 100*  CO2 30 28  GLUCOSE 114* 102*  BUN 39* 36*  CREATININE 1.53* 1.39*  CALCIUM 8.3* 8.1*    CBG (last 3)   Recent Labs  12/11/16 1200 12/11/16 1617 12/11/16 2009  GLUCAP 115* 97 154*    PT/INR:  No results for input(s): LABPROT, INR in the last 72 hours.  CXR:  N/A  Assessment/Plan: S/P Procedure(s) (LRB): CORONARY ARTERY BYPASS GRAFTING (CABG) X 2 (N/A) TRANSESOPHAGEAL ECHOCARDIOGRAM (TEE) (N/A) BIOLOGICAL BENTALL Burgess ROOT REPLACEMENT (N/A) ENDOVEIN HARVEST OF GREATER SAPHENOUS VEIN (Left) THORACIC ASCENDING ANEURYSM REPAIR (AAA) (N/A)  Cardiovascular - Hemodynamically stable this morning. NSR at a rate of 88bpm. BP 106/68on  6.25 mg Carvedilol BID.   Pulmonary - SpO2 98% on 2L this morning. Lung sounds are diminished but without crackles this morning. Encouraged IS and mobilization as tolerated.   Anemia - Expected  in the post-operative period. Latest H&H is 8.1 and 25.2. Continue to monitor.   Fluid Overload- Expected in the post-operative period. He remains 1kg above post-operative weight. Currently on 40 mg Lasix daily PO.I/O -960 in the last 24 hours.   Renal Impairment - sCr continues to improved to 1.39this morning, does have CKD at baseline. UO in the last 24 hours is 1,200. Will continue to monitor.   Hypokalemia -K+ 4.0this morning. May continue to lose this in his urine through diuresis. Will likely continue to need supplementation.   CBG - Pre-operative HgbA1c was 5.7, so ACHS glucose check can stop while on floor   Diuresing well and renal function improving this morning, still 1kg above pre-operative weight Monitor K+ and supplement as needed Mobilize Encourage IS use May be ready for discharge tomorrow  Edison Simon, PA-S 12/14/2016 7:55 AM  I have seen and examined the patient and agree with the assessment and plan as outlined.  Wean O2.  Anticipate possible d/c home tomorrow or Sunday.  May need home PT +/- home O2  Rexene Alberts, MD 12/14/2016 10:32 AM

## 2016-12-14 NOTE — Evaluation (Signed)
Physical Therapy Evaluation Patient Details Name: Kevin Burgess MRN: [id number] DOB: 1940/05/01 Today's Date: 12/14/2016   History of Present Illness  76 y.o. male admitted on 12/04/16 for chest pain, elevated troponin, and HTN.  He transfered here to [facility] and underwent a CABG x2 (L greater saphenous vein graft), biological bentall aortic root replacement, and thoracic ascending aneurysm repair.  Pt with significant PMH of prostate CA (and suspected uncontrolled HTN per MD notes).    Clinical Impression  Pt is generally weak with increased DOE with gait, but stable O2 on RA and stable HR.  He struggles with bed mobility as he wants to pull heavily with his arms, but once up seems to do ok with a slow, shuffling gait pattern with RW.  He would benefit from some home therapy to make sure he can get around his own home well enough and help him progress his gait and exercises.  IS max inspired volume today was 500 mL.   PT to follow acutely for deficits listed below.       Follow Up Recommendations Home health PT;Supervision for mobility/OOB    Equipment Recommendations  None recommended by PT    Recommendations for Other Services   NA    Precautions / Restrictions Precautions Precautions: Sternal Precaution Comments: reviewed with pt to limit pushing and pulling with arms, hug pillow while coughing and use legs more than arms to get to standing.       Mobility  Bed Mobility Overal bed mobility: Needs Assistance Bed Mobility: Rolling;Sidelying to Sit Rolling: Min assist Sidelying to sit: Min assist       General bed mobility comments: Pt wanting to use his hands more than he should and struggling to get EOB without relying on them heavily.  I had him hold his pilllow in an attempt to keep hip from pulling so hard.   Transfers Overall transfer level: Needs assistance Equipment used: Rolling walker (2 wheeled) Transfers: Sit to/from Stand Sit to Stand: Min assist          General transfer comment: Min assist to power up to stand, using momentum.  Pt wanting to pull up on the walker with his hands.   Ambulation/Gait Ambulation/Gait assistance: Min guard Ambulation Distance (Feet): 170 Feet Assistive device: Rolling walker (2 wheeled) Gait Pattern/deviations: Step-through pattern Gait velocity: decreased   General Gait Details: slow, shuffling gait pattern.  Verbal cues for pursed lip breathing as his DOE was 2-3/4 with gait.  Pt's O2 sats remained stable on RA at 94%, HR stable in the 80s.           Balance Overall balance assessment: Needs assistance Sitting-balance support: Feet supported;No upper extremity supported Sitting balance-Leahy Scale: Good     Standing balance support: Bilateral upper extremity supported Standing balance-Leahy Scale: Fair                               Pertinent Vitals/Pain Pain Assessment: No/denies pain    Home Living Family/patient expects to be discharged to:: Private residence Living Arrangements: Children;Other relatives;Other (Comment) (son, grandson, mother) Available Help at Discharge: Family;Available 24 hours/day Type of Home: House Home Access: Stairs to enter Entrance Stairs-Rails: None Entrance Stairs-Number of Steps: 2 Home Layout: One level Home Equipment: Walker - 2 wheels      Prior Function Level of Independence: Independent         Comments: independent PTA, drives, "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      Hand Dominance   Dominant Hand: Right    Extremity/Trunk Assessment   Upper Extremity Assessment Upper Extremity Assessment: Generalized weakness    Lower Extremity Assessment Lower Extremity Assessment: Generalized weakness    Cervical / Trunk Assessment Cervical / Trunk Assessment: Normal  Communication   Communication: HOH  Cognition Arousal/Alertness:  Awake/alert Behavior During Therapy: WFL for tasks assessed/performed Overall Cognitive Status: No family/caregiver present to determine baseline cognitive functioning                                 General Comments: Pt able to tell me his birthdate, that it is 2017, but initially he insisted he was 78 and that his mother is 1.  He finally came back around and reported that his mother is in her 31s.  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        General Comments General comments (skin integrity, edema, etc.): IS x 8 reps max inspired volume was 500 mL        Assessment/Plan    PT Assessment Patient needs continued PT services  PT Problem List Decreased strength;Decreased activity tolerance;Decreased balance;Decreased mobility;Decreased knowledge of precautions;Cardiopulmonary status limiting activity;Pain       PT Treatment Interventions DME instruction;Gait training;Stair training;Functional mobility training;Therapeutic exercise;Therapeutic activities;Balance training;Patient/family education    PT Goals (Current goals can be found in the Care Plan section)  Acute Rehab PT Goals Patient Stated Goal: to get better PT Goal Formulation: With patient Time For Goal Achievement: 12/28/16 Potential to Achieve Goals: Good    Frequency Min 3X/week    AM-PAC PT "6 Clicks" Daily Activity  Outcome Measure Difficulty turning over in bed (including adjusting bedclothes, sheets and blankets)?: Unable Difficulty moving from lying on back to sitting on the side of the bed? : Unable Difficulty sitting down on and standing up from a chair with arms (e.g., wheelchair, bedside commode, etc,.)?: Unable Help needed moving to and from a bed to chair (including a wheelchair)?: A Little Help needed walking in hospital room?: A Little Help needed climbing 3-5 steps with a railing? : A  Little 6 Click Score: 12    End of Session Equipment Utilized During Treatment: Gait belt Activity Tolerance: Patient limited by fatigue Patient left: in chair;with call bell/phone within reach;with chair alarm set Nurse Communication: Mobility status PT Visit Diagnosis: Muscle weakness (generalized) (M62.81);Difficulty in walking, not elsewhere classified (R26.2)    Time: 9024-0973 PT Time Calculation (min) (ACUTE ONLY): 27 min   Charges:         Wells Guiles B. Daryus Sowash, PT, DPT [phone]   PT Evaluation $PT Eval Moderate Complexity: 1 Mod PT Treatments $Gait Training: 8-22 mins   12/14/2016, 4:49 PM

## 2016-12-14 NOTE — Telephone Encounter (Signed)
Currently admitted.

## 2016-12-14 NOTE — Telephone Encounter (Signed)
-----   Message from Candis Schatz sent at 12/14/2016 10:48 AM EDT ----- Regarding: FW: Follow Up Appointment Please call pt with an appt.   Thanks Trish ----- Message ----- From: Nani Skillern, PA-C Sent: 12/14/2016  10:20 AM To: Candis Schatz Subject: Follow Up Appointment                          Hi Trish. MRN [id number] is s/p CABG x 2, Bentall, repair ATAA by Dr. Roxy Manns on 12/07/2016. Cardiologist is Dr. Saunders Revel. He needs a 2 week follow up appointment from Monday 12/17/2016. Thanks

## 2016-12-14 NOTE — Plan of Care (Signed)
Problem: Respiratory: Goal: Levels of oxygenation will improve Outcome: Progressing Pt now on room air, sats low 90s

## 2016-12-14 NOTE — Telephone Encounter (Signed)
It is fine to add Mr. Costilla onto my schedule on 12/26/16 at 4:20.  Nelva Bush, MD [facility] Pager: [phone]

## 2016-12-14 NOTE — Progress Notes (Signed)
CARDIAC REHAB PHASE I   PRE:  Rate/Rhythm: 66 SR    BP:     SaO2: 96 2L, 93 RA  MODE:  Ambulation: 210 ft   POST:  Rate/Rhythm: 81 SR    BP: sitting 107/65     SaO2: 96 RA  On my arrival pt eager to get to bed to get warm. Pt agreed to walk then go to bed. Gave pt verbal cues on how to stand and he followed them, min assist to stand. Used RW, steady in hall. He ambulated without O2, SaO2 in mid 90s (hard to register at times). C/o weakness, I had to encouraged him to go just a little farther. Return to bed. SaO2 96 RA after walk,  88-91 RA in bed.  Will need family for education. Pt agreeable to walk with PT later. [place], ACSM 12/14/2016 2:22 PM

## 2016-12-15 MED ORDER — LOPERAMIDE HCL 2 MG PO CAPS: 2.0000 mg | ORAL_CAPSULE | ORAL | Status: DC | PRN

## 2016-12-15 MED ORDER — TRIAMCINOLONE ACETONIDE 0.5 % EX CREA
TOPICAL_CREAM | Freq: Two times a day (BID) | CUTANEOUS | Status: DC
  Administered 2016-12-15 – 2016-12-16 (×2): via TOPICAL
  Filled 2016-12-15 (×2): qty 15

## 2016-12-15 NOTE — Progress Notes (Addendum)
      [address]       [place] [address]             [phone]        8 Days Post-Op Procedure(s) (LRB): CORONARY ARTERY BYPASS GRAFTING (CABG) X 2 (N/A) TRANSESOPHAGEAL ECHOCARDIOGRAM (TEE) (N/A) BIOLOGICAL BENTALL AORTIC ROOT REPLACEMENT (N/A) ENDOVEIN HARVEST OF GREATER SAPHENOUS VEIN (Left) THORACIC ASCENDING ANEURYSM REPAIR (AAA) (N/A)  Subjective: Patient without complaints this am.  Objective: Vital signs in last 24 hours: Temp:  [98.2 F (36.8 C)] 98.2 F (36.8 C) (10/06 0416) Pulse Rate:  [74-82] 82 (10/06 0500) Cardiac Rhythm: Supraventricular tachycardia;Other (Comment) (10/06 0328) Resp:  [16-25] 25 (10/06 0500) BP: (92-113)/(55-80) 113/80 (10/06 0416) SpO2:  [91 %-94 %] 93 % (10/06 0500) Weight:  [153 lb 4.8 oz (69.5 kg)] 153 lb 4.8 oz (69.5 kg) (10/06 0500)  Pre op weight 69.9 kg Current Weight  12/15/16 153 lb 4.8 oz (69.5 kg)      Intake/Output from previous day: 10/05 0701 - 10/06 0700 In: 145 [P.O.:125; I.V.:20] Out: 42 [Urine:420]   Physical Exam:  Cardiovascular: RRR, no murmur Pulmonary: Clear to auscultation bilaterally; no rales, wheezes, or rhonchi. Abdomen: Soft, non tender, bowel sounds present. Extremities: Trace bilateral lower extremity edema. Wounds: Clean and dry.  No erythema or signs of infection.  Lab Results: CBC: Recent Labs  12/13/16 0503 12/14/16 0346  WBC 8.6 8.1  HGB 8.8* 8.1*  HCT 25.9* 25.2*  PLT 182 196   BMET:  Recent Labs  12/13/16 0503 12/14/16 0346  NA 139 134*  K 4.0 4.0  CL 99* 100*  CO2 30 28  GLUCOSE 114* 102*  BUN 39* 36*  CREATININE 1.53* 1.39*  CALCIUM 8.3* 8.1*    PT/INR:  Lab Results  Component Value Date   INR 1.44 12/07/2016   INR 1.56 12/07/2016   INR 1.09 12/06/2016   ABG:  INR: Will add last result for INR, ABG once components are confirmed Will add last 4 CBG results once components are confirmed  Assessment/Plan:  1. CV - Had a few runs of  SVT.SR in the 80's this am.On Coreg 6.25 mg bid and Plavix 75 mg daily. As discussed with Dr. Prescott Gum continue to monitor. 2.  Pulmonary - On room air.  Nurse states oxygen saturation only decreased to 89% when on room air with ambulation. Encourage incentive spirometer. 3. Volume Overload - On Lasix 40 mg daily 4.  Acute blood loss anemia - H and H yesterday stable at 8.1 and 25.2. Continue Foltx, Niferex 5. CKD-creatinine yesterday down to 1.39. His creatinine upon admission was 1.26 6. Hope to discharge in am  ZIMMERMAN,DONIELLE MPA-C 12/15/2016,8:41 AM  patient examined and medical record reviewed,agree with above note. Tharon Aquas Trigt III 12/15/2016

## 2016-12-15 NOTE — Progress Notes (Signed)
CARDIAC REHAB PHASE I   PRE:  Rate/Rhythm: 63 SR  BP:  Supine:   Sitting: 106/63  Standing:    SaO2: 91 RA  MODE:  Ambulation: 210 ft   POST:  Rate/Rhythm: 73 SR  BP:  Supine:   Sitting: 120/55  Standing:    SaO2: 88-94 RA 1440-1545 On arrival pt's condom catheter had come off and he was wet. Attempted to change pt then he was also incontinent of liquid stool when attempting to stand. Pt had no control of his urine stream or of stool and incontinent of both on walking to the bathroom. I was able to get him cleaned up and placed a depend on him, this is what he wears at home. His wife states that he has much better control of his urine stream at home prior to admission. Assisted X 1 and used walker to ambulate pt. He c/o of feeling weak and wanted to turn around before he did. I encouraged him to try to go farther. Pt is putting a lot of weight on his arms to stand. I feel that he would benefit from a Physical Therapy consult to help with strengthening and to assess discharge needs. Rash noted and reported to Dr. Darcey Nora. Pt states that it itches.We will continue to follow.  Rodney Langton RN 12/15/2016 4:09 PM

## 2016-12-16 ENCOUNTER — Other Ambulatory Visit: Payer: Self-pay | Admitting: Physician Assistant

## 2016-12-16 LAB — BASIC METABOLIC PANEL
ANION GAP: 10 (ref 5–15)
BUN: 32 mg/dL — AB (ref 6–20)
CALCIUM: 8.4 mg/dL — AB (ref 8.9–10.3)
CO2: 28 mmol/L (ref 22–32)
CREATININE: 1.33 mg/dL — AB (ref 0.61–1.24)
Chloride: 99 mmol/L — ABNORMAL LOW (ref 101–111)
GFR calc Af Amer: 59 mL/min — ABNORMAL LOW (ref >60)
GFR, EST NON AFRICAN AMERICAN: 51 mL/min — AB (ref >60)
GLUCOSE: 100 mg/dL — AB (ref 65–99)
Potassium: 4.7 mmol/L (ref 3.5–5.1)
Sodium: 137 mmol/L (ref 135–145)

## 2016-12-16 LAB — CBC
HCT: 25.4 % — ABNORMAL LOW (ref 39.0–52.0)
Hemoglobin: 8.3 g/dL — ABNORMAL LOW (ref 13.0–17.0)
MCH: 30.1 pg (ref 26.0–34.0)
MCHC: 32.7 g/dL (ref 30.0–36.0)
MCV: 92 fL (ref 78.0–100.0)
Platelets: 224 10*3/uL (ref 150–400)
RBC: 2.76 MIL/uL — ABNORMAL LOW (ref 4.22–5.81)
RDW: 15.1 % (ref 11.5–15.5)
WBC: 11.2 10*3/uL — ABNORMAL HIGH (ref 4.0–10.5)

## 2016-12-16 MED ORDER — CLOPIDOGREL BISULFATE 75 MG PO TABS: 75.0000 mg | ORAL_TABLET | Freq: Every day | ORAL | 1 refills | Status: DC

## 2016-12-16 MED ORDER — FUROSEMIDE 40 MG PO TABS: 40.0000 mg | ORAL_TABLET | Freq: Every day | ORAL | 0 refills | Status: DC

## 2016-12-16 MED ORDER — FERROUS SULFATE 325 (65 FE) MG PO TABS: 325.0000 mg | ORAL_TABLET | Freq: Every day | ORAL | 3 refills | Status: DC

## 2016-12-16 MED ORDER — TRAMADOL HCL 50 MG PO TABS: ORAL_TABLET | ORAL | 0 refills | Status: DC

## 2016-12-16 MED ORDER — TRIAMCINOLONE ACETONIDE 0.5 % EX CREA: TOPICAL_CREAM | Freq: Two times a day (BID) | CUTANEOUS | 0 refills | Status: DC

## 2016-12-16 MED ORDER — CARVEDILOL 6.25 MG PO TABS: 6.2500 mg | ORAL_TABLET | Freq: Two times a day (BID) | ORAL | 1 refills | Status: DC

## 2016-12-16 NOTE — Progress Notes (Signed)
Physical Therapy Treatment Patient Details Name: Kevin Burgess MRN: [id number] DOB: 1940/06/22 Today's Date: 12/16/2016    History of Present Illness 76 y.o. male admitted on 12/04/16 for chest pain, elevated troponin, and HTN.  He transfered here to [facility] and underwent a CABG x2 (L greater saphenous vein graft), biological bentall aortic root replacement, and thoracic ascending aneurysm repair.  Pt with significant PMH of prostate CA (and suspected uncontrolled HTN per MD notes).      PT Comments    Patient continues to progress toward mobility goals. Pt tolerated increased gait distance and stair training. Pt able to perform functional transfers with min A to maintain sternal precautions. Current plan remains appropriate.   Follow Up Recommendations  Home health PT;Supervision for mobility/OOB     Equipment Recommendations  None recommended by PT    Recommendations for Other Services       Precautions / Restrictions Precautions Precautions: Sternal Precaution Comments: reviewed with pt     Mobility  Bed Mobility               General bed mobility comments: pt OOB in chair upon arrival  Transfers Overall transfer level: Needs assistance Equipment used: Rolling walker (2 wheeled) Transfers: Sit to/from Stand Sit to Stand: Min assist         General transfer comment: assist to power up into standing; cues for technique; pt hugging cardiac pillow  Ambulation/Gait Ambulation/Gait assistance: Min guard;Supervision Ambulation Distance (Feet): 250 Feet Assistive device: Rolling walker (2 wheeled) Gait Pattern/deviations: Step-through pattern;Decreased stride length Gait velocity: decreased   General Gait Details: steady gait; cues for increased bilat step lengths; safe use of AD   Stairs Stairs: Yes   Stair Management: No rails;Step to pattern;Forwards Number of Stairs: 2 General stair comments: pt required HHA to power up onto each step; no  use of rails to simulate home entrance  Wheelchair Mobility    Modified Rankin (Stroke Patients Only)       Balance Overall balance assessment: Needs assistance Sitting-balance support: Feet supported;No upper extremity supported Sitting balance-Leahy Scale: Good     Standing balance support: Bilateral upper extremity supported Standing balance-Leahy Scale: Fair                              Cognition Arousal/Alertness: Awake/alert Behavior During Therapy: WFL for tasks assessed/performed Overall Cognitive Status: Within Functional Limits for tasks assessed                                        Exercises      General Comments General comments (skin integrity, edema, etc.): SpO2 90% > RA       Pertinent Vitals/Pain Pain Assessment: Faces Faces Pain Scale: Hurts a little bit Pain Location: chest Pain Descriptors / Indicators: Discomfort Pain Intervention(s): Monitored during session    Home Living                      Prior Function            PT Goals (current goals can now be found in the care plan section) Acute Rehab PT Goals Patient Stated Goal: to get better PT Goal Formulation: With patient Time For Goal Achievement: 12/28/16 Potential to Achieve Goals: Good Progress towards PT goals: Progressing toward goals    Frequency  Min 3X/week      PT Plan Current plan remains appropriate    Co-evaluation              AM-PAC PT "6 Clicks" Daily Activity  Outcome Measure  Difficulty turning over in bed (including adjusting bedclothes, sheets and blankets)?: Unable Difficulty moving from lying on back to sitting on the side of the bed? : A Lot Difficulty sitting down on and standing up from a chair with arms (e.g., wheelchair, bedside commode, etc,.)?: Unable Help needed moving to and from a bed to chair (including a wheelchair)?: A Little Help needed walking in hospital room?: None Help needed climbing  3-5 steps with a railing? : A Little 6 Click Score: 14    End of Session Equipment Utilized During Treatment: Gait belt Activity Tolerance: Patient tolerated treatment well Patient left: in chair;with call bell/phone within reach Nurse Communication: Mobility status PT Visit Diagnosis: Muscle weakness (generalized) (M62.81);Difficulty in walking, not elsewhere classified (R26.2)     Time: 2376-2831 PT Time Calculation (min) (ACUTE ONLY): 30 min  Charges:  $Gait Training: 8-22 mins $Therapeutic Activity: 8-22 mins                    G Codes:       Earney Navy, PTA Pager: [phone]     Darliss Cheney 12/16/2016, 2:25 PM

## 2016-12-16 NOTE — Care Management Note (Addendum)
Case Management Note  Patient Details  Name: Kevin Burgess MRN: [id number] Date of Birth: 10/04/1940  Subjective/Objective: 76 y.o. M s/p CABG and Abd aneurysm repair to be discharged with Home DME Oxygen which will be provided by [facility] and delivered to his room prior to discharge. HHPT and RN has been referred to [facility] and CM LM for [employer].  CM left instructions for pt to call CM office in [place] if he does not hear from [facility] agency within 24 hours of discharge so that another [facility] agency can be contacted. This CM will call pt on Wed 12/19/16 to follow up                 Action/Plan: CM will sign off for now but will be available should additional discharge needs arise or disposition change.    Expected Discharge Date:  12/16/16               Expected Discharge Plan:     In-House Referral:     Discharge planning Services  CM Consult  Post Acute Care Choice:  Home Health, Durable Medical Equipment Choice offered to:  Patient  DME Arranged:  Oxygen DME Agency:  [place]:  RN, PT [facility] Agency:  [place]  Status of Service:  Completed, signed off  If discussed at [place] of Stay Meetings, dates discussed:    Additional Comments:  Delrae Sawyers, RN 12/16/2016, 3:29 PM

## 2016-12-16 NOTE — Progress Notes (Addendum)
      [address]       [place] [address]             [phone]        9 Days Post-Op Procedure(s) (LRB): CORONARY ARTERY BYPASS GRAFTING (CABG) X 2 (N/A) TRANSESOPHAGEAL ECHOCARDIOGRAM (TEE) (N/A) BIOLOGICAL BENTALL AORTIC ROOT REPLACEMENT (N/A) ENDOVEIN HARVEST OF GREATER SAPHENOUS VEIN (Left) THORACIC ASCENDING ANEURYSM REPAIR (AAA) (N/A)  Subjective: Patient without complaints this am. He states he wants to go home.  Objective: Vital signs in last 24 hours: Temp:  [97.7 F (36.5 C)-98.1 F (36.7 C)] 98.1 F (36.7 C) (10/07 0530) Pulse Rate:  [59-69] 69 (10/07 0530) Cardiac Rhythm: Normal sinus rhythm (10/07 0701) Resp:  [15-21] 15 (10/07 0530) BP: (88-103)/(59-66) 98/64 (10/07 0530) SpO2:  [90 %-98 %] 96 % (10/07 0530) Weight:  [158 lb 9.6 oz (71.9 kg)] 158 lb 9.6 oz (71.9 kg) (10/07 0530)  Pre op weight 69.9 kg Current Weight  12/16/16 158 lb 9.6 oz (71.9 kg)      Intake/Output from previous day: 10/06 0701 - 10/07 0700 In: 60 [P.O.:60] Out: 450 [Urine:450]   Physical Exam:  Cardiovascular: RRR, no murmur Pulmonary: Clear to auscultation bilaterally Abdomen: Soft, non tender, bowel sounds present. Extremities: Trace bilateral lower extremity edema. Rash on back and part of legs Wounds: Clean and dry.  No erythema or signs of infection.  Lab Results: CBC:  Recent Labs  12/14/16 0346 12/16/16 0406  WBC 8.1 11.2*  HGB 8.1* 8.3*  HCT 25.2* 25.4*  PLT 196 224   BMET:   Recent Labs  12/14/16 0346 12/16/16 0406  NA 134* 137  K 4.0 4.7  CL 100* 99*  CO2 28 28  GLUCOSE 102* 100*  BUN 36* 32*  CREATININE 1.39* 1.33*  CALCIUM 8.1* 8.4*    PT/INR:  Lab Results  Component Value Date   INR 1.44 12/07/2016   INR 1.56 12/07/2016   INR 1.09 12/06/2016   ABG:  INR: Will add last result for INR, ABG once components are confirmed Will add last 4 CBG results once components are confirmed  Assessment/Plan:  1. CV -  Previous run SVT.SR in the 70's this am.On Coreg 6.25 mg bid and Plavix 75 mg daily.  2.  Pulmonary - Nurse states oxygen saturationdecreased to 84% when on room air with ambulation. He will need 2 liters of oxygen at discharge.Encourage incentive spirometer. 3. Volume Overload - On Lasix 40 mg daily 4.  Acute blood loss anemia - H and H stable at 8.3 and 25.4. Continue Foltx, Niferex 5. CKD-creatinine down to 1.33. His creatinine upon admission was 1.26 6. Triamcinolone for rash 7. As discussed with Dr. Prescott Gum, will discharge with [facility], home PT, and oxygen  Tashunda Vandezande MPA-C 12/16/2016,8:22 AM

## 2016-12-16 NOTE — Discharge Instructions (Signed)
Aortic Valve Replacement, Care After Refer to this sheet in the next few weeks. These instructions provide you with information about caring for yourself after your procedure. Your health care provider may also give you more specific instructions. Your treatment has been planned according to current medical practices, but problems sometimes occur. Call your health care provider if you have any problems or questions after your procedure. What can I expect after the procedure? After the procedure, it is common to have:  Pain around your incision area.  A small amount of blood or clear fluid coming from your incision.  Follow these instructions at home: Eating and drinking   Follow instructions from your health care provider about eating or drinking restrictions. ? Limit alcohol intake to no more than 1 drink per day for nonpregnant women and 2 drinks per day for men. One drink equals 12 oz of beer, 5 oz of wine, or 1 oz of hard liquor. ? Limit how much caffeine you drink. Caffeine can affect your heart's rate and rhythm.  Drink enough fluid to keep your urine clear or pale yellow.  Eat a heart-healthy diet. This should include plenty of fresh fruits and vegetables. If you eat meat, it should be lean cuts. Avoid foods that are: ? High in salt, saturated fat, or sugar. ? Canned or highly processed. ? Fried. Activity  Return to your normal activities as told by your health care provider. Ask your health care provider what activities are safe for you.  Exercise regularly once you have recovered, as told by your health care provider.  Avoid sitting for more than 2 hours at a time without moving. Get up and move around at least once every 1-2 hours. This helps to prevent blood clots in the legs.  Do not lift anything that is heavier than 10 lb (4.5 kg) until your health care provider approves.  Avoid pushing or pulling things with your arms until your health care provider approves. This  includes pulling on handrails to help you climb stairs. Incision care   Follow instructions from your health care provider about how to take care of your incision. Make sure you: ? Wash your hands with soap and water before you change your bandage (dressing). If soap and water are not available, use hand sanitizer. ? Change your dressing as told by your health care provider. ? Leave stitches (sutures), skin glue, or adhesive strips in place. These skin closures may need to stay in place for 2 weeks or longer. If adhesive strip edges start to loosen and curl up, you may trim the loose edges. Do not remove adhesive strips completely unless your health care provider tells you to do that.  Check your incision area every day for signs of infection. Check for: ? More redness, swelling, or pain. ? More fluid or blood. ? Warmth. ? Pus or a bad smell. Medicines  Take over-the-counter and prescription medicines only as told by your health care provider.  If you were prescribed an antibiotic medicine, take it as told by your health care provider. Do not stop taking the antibiotic even if you start to feel better. Travel  Avoid airplane travel for as long as told by your health care provider.  When you travel, bring a list of your medicines and a record of your medical history with you. Carry your medicines with you. Driving  Ask your health care provider when it is safe for you to drive. Do not drive until your health  care provider approves.  Do not drive or operate heavy machinery while taking prescription pain medicine. Lifestyle   Do not use any tobacco products, such as cigarettes, chewing tobacco, or e-cigarettes. If you need help quitting, ask your health care provider.  Resume sexual activity as told by your health care provider. Do not use medicines for erectile dysfunction unless your health care provider approves, if this applies.  Work with your health care provider to keep your  blood pressure and cholesterol under control, and to manage any other heart conditions that you have.  Maintain a healthy weight. General instructions  Do not take baths, swim, or use a hot tub until your health care provider approves.  Do not strain to have a bowel movement.  Avoid crossing your legs while sitting down.  Check your temperature every day for a fever. A fever may be a sign of infection.  If you are a woman and you plan to become pregnant, talk with your health care provider before you become pregnant.  Wear compression stockings if your health care provider instructs you to do this. These stockings help to prevent blood clots and reduce swelling in your legs.  Tell all health care providers who care for you that you have an artificial (prosthetic) aortic valve. If you have or have had heart disease or endocarditis, tell all health care providers about these conditions as well.  Keep all follow-up visits as told by your health care provider. This is important. Contact a health care provider if:  You develop a skin rash.  You experience sudden, unexplained changes in your weight.  You have more redness, swelling, or pain around your incision.  You have more fluid or blood coming from your incision.  Your incision feels warm to the touch.  You have pus or a bad smell coming from your incision.  You have a fever. Get help right away if:  You develop chest pain that is different from the pain coming from your incision.  You develop shortness of breath or difficulty breathing.  You start to feel light-headed. These symptoms may represent a serious problem that is an emergency. Do not wait to see if the symptoms will go away. Get medical help right away. Call your local emergency services (911 in the [place] Do not drive yourself to the hospital. This information is not intended to replace advice given to you by your health care provider. Make sure you discuss any  questions you have with your health care provider. Document Released: 09/14/2004 Document Revised: 08/04/2015 Document Reviewed: 01/30/2015 Elsevier Interactive Patient Education  2017 [employer]    Coronary Artery Bypass Grafting, Care After This sheet gives you information about how to care for yourself after your procedure. Your health care provider may also give you more specific instructions. If you have problems or questions, contact your health care provider. What can I expect after the procedure? After the procedure, it is common to have:  Nausea and a lack of appetite.  Constipation.  Weakness and fatigue.  Depression or irritability.  Pain or discomfort in your incision areas.  Follow these instructions at home: Medicines  Take over-the-counter and prescription medicines only as told by your health care provider. Do not stop taking medicines or start any new medicines without approval from your health care provider.  If you were prescribed an antibiotic medicine, take it as told by your health care provider. Do not stop taking the antibiotic even if you start to  feel better.  Do not drive or use heavy machinery while taking prescription pain medicine. Incision care  Follow instructions from your health care provider about how to take care of your incisions. Make sure you: ? Wash your hands with soap and water before you change your bandage (dressing). If soap and water are not available, use hand sanitizer. ? Change your dressing as told by your health care provider. ? Leave stitches (sutures), skin glue, or adhesive strips in place. These skin closures may need to stay in place for 2 weeks or longer. If adhesive strip edges start to loosen and curl up, you may trim the loose edges. Do not remove adhesive strips completely unless your health care provider tells you to do that.  Keep incision areas clean, dry, and protected.  Check your incision areas every day for  signs of infection. Check for: ? More redness, swelling, or pain. ? More fluid or blood. ? Warmth. ? Pus or a bad smell.  If incisions were made in your legs: ? Avoid crossing your legs. ? Avoid sitting for long periods of time. Change positions every 30 minutes. ? Raise (elevate) your legs when you are sitting. Bathing  Do not take baths, swim, or use a hot tub until your health care provider approves.  Only take sponge baths. Pat the incisions dry. Do not rub incisions with a washcloth or towel.  Ask your health care provider when you can shower. Eating and drinking  Eat foods that are high in fiber, such as raw fruits and vegetables, whole grains, beans, and nuts. Meats should be lean cut. Avoid canned, processed, and fried foods. This can help prevent constipation and is a recommended part of a heart-healthy diet.  Drink enough fluid to keep your urine clear or pale yellow.  Limit alcohol intake to no more than 1 drink a day for nonpregnant women and 2 drinks a day for men. One drink equals 12 oz of beer, 5 oz of wine, or 1 oz of hard liquor. Activity  Rest and limit your activity as told by your health care provider. You may be instructed to: ? Stop any activity right away if you have chest pain, shortness of breath, irregular heartbeats, or dizziness. Get help right away if you have any of these symptoms. ? Move around frequently for short periods or take short walks as directed by your health care provider. Gradually increase your activities. You may need physical therapy or cardiac rehabilitation to help strengthen your muscles and build your endurance. ? Avoid lifting, pushing, or pulling anything that is heavier than 10 lb (4.5 kg) for at least 6 weeks or as told by your health care provider.  Do not drive until your health care provider approves.  Ask your health care provider when you may return to work.  Ask your health care provider when you may resume sexual  activity. General instructions  Do not use any products that contain nicotine or tobacco, such as cigarettes and e-cigarettes. If you need help quitting, ask your health care provider.  Take 2-3 deep breaths every few hours during the day, while you recover. This helps expand your lungs and prevent complications like pneumonia after surgery.  If you were given a device called an incentive spirometer, use it several times a day to practice deep breathing. Support your chest with a pillow or your arms when you take deep breaths or cough.  Wear compression stockings as told by your health care provider.  These stockings help to prevent blood clots and reduce swelling in your legs.  Weigh yourself every day. This helps identify if your body is holding (retaining) fluid that may make your heart and lungs work harder.  Keep all follow-up visits as told by your health care provider. This is important. Contact a health care provider if:  You have more redness, swelling, or pain around any incision.  You have more fluid or blood coming from any incision.  Any incision feels warm to the touch.  You have pus or a bad smell coming from any incision  You have a fever.  You have swelling in your ankles or legs.  You have pain in your legs.  You gain 2 lb (0.9 kg) or more a day.  You are nauseous or you vomit.  You have diarrhea. Get help right away if:  You have chest pain that spreads to your jaw or arms.  You are short of breath.  You have a fast or irregular heartbeat.  You notice a "clicking" in your breastbone (sternum) when you move.  You have numbness or weakness in your arms or legs.  You feel dizzy or light-headed. Summary  After the procedure, it is common to have pain or discomfort in the incision areas.  Do not take baths, swim, or use a hot tub until your health care provider approves.  Gradually increase your activities. You may need physical therapy or cardiac  rehabilitation to help strengthen your muscles and build your endurance.  Weigh yourself every day. This helps identify if your body is holding (retaining) fluid that may make your heart and lungs work harder. This information is not intended to replace advice given to you by your health care provider. Make sure you discuss any questions you have with your health care provider. Document Released: 09/15/2004 Document Revised: 01/16/2016 Document Reviewed: 01/16/2016 Elsevier Interactive Patient Education  2018 [employer]. Prediabetes Eating Plan Prediabetes--also called impaired glucose tolerance or impaired fasting glucose--is a condition that causes blood sugar (blood glucose) levels to be higher than normal. Following a healthy diet can help to keep prediabetes under control. It can also help to lower the risk of type 2 diabetes and heart disease, which are increased in people who have prediabetes. Along with regular exercise, a healthy diet:  Promotes weight loss.  Helps to control blood sugar levels.  Helps to improve the way that the body uses insulin.  What do I need to know about this eating plan?  Use the glycemic index (GI) to plan your meals. The index tells you how quickly a food will raise your blood sugar. Choose low-GI foods. These foods take a longer time to raise blood sugar.  Pay close attention to the amount of carbohydrates in the food that you eat. Carbohydrates increase blood sugar levels.  Keep track of how many calories you take in. Eating the right amount of calories will help you to achieve a healthy weight. Losing about 7 percent of your starting weight can help to prevent type 2 diabetes.  You may want to follow a Mediterranean diet. This diet includes a lot of vegetables, lean meats or fish, whole grains, fruits, and healthy oils and fats. What foods can I eat? Grains Whole grains, such as whole-wheat or whole-grain breads, crackers, cereals, and pasta.  Unsweetened oatmeal. Bulgur. Barley. Quinoa. Brown rice. Corn or whole-wheat flour tortillas or taco shells. Vegetables Lettuce. Spinach. Peas. Beets. Cauliflower. Cabbage. Broccoli. Carrots. Tomatoes. Squash. Eggplant. Herbs.  Peppers. Onions. Cucumbers. Brussels sprouts. Fruits Berries. Bananas. Apples. Oranges. Grapes. Papaya. Mango. Pomegranate. Kiwi. Grapefruit. Cherries. Meats and Other Protein Sources Seafood. Lean meats, such as chicken and [place] or lean cuts of pork and beef. Tofu. Eggs. Nuts. Beans. Dairy Low-fat or fat-free dairy products, such as yogurt, cottage cheese, and cheese. Beverages Water. Tea. Coffee. Sugar-free or diet soda. Seltzer water. Milk. Milk alternatives, such as soy or almond milk. Condiments Mustard. Relish. Low-fat, low-sugar ketchup. Low-fat, low-sugar barbecue sauce. Low-fat or fat-free mayonnaise. Sweets and Desserts Sugar-free or low-fat pudding. Sugar-free or low-fat ice cream and other frozen treats. Fats and Oils Avocado. Walnuts. Olive oil. The items listed above may not be a complete list of recommended foods or beverages. Contact your dietitian for more options. What foods are not recommended? Grains Refined white flour and flour products, such as bread, pasta, snack foods, and cereals. Beverages Sweetened drinks, such as sweet iced tea and soda. Sweets and Desserts Baked goods, such as cake, cupcakes, pastries, cookies, and cheesecake. The items listed above may not be a complete list of foods and beverages to avoid. Contact your dietitian for more information. This information is not intended to replace advice given to you by your health care provider. Make sure you discuss any questions you have with your health care provider. Document Released: 07/13/2014 Document Revised: 08/04/2015 Document Reviewed: 03/24/2014 Elsevier Interactive Patient Education  2017 [employer] Coronary Artery Bypass Grafting, Care After This sheet gives you  information about how to care for yourself after your procedure. Your health care provider may also give you more specific instructions. If you have problems or questions, contact your health care provider. What can I expect after the procedure? After the procedure, it is common to have:  Nausea and a lack of appetite.  Constipation.  Weakness and fatigue.  Depression or irritability.  Pain or discomfort in your incision areas.  Follow these instructions at home: Medicines  Take over-the-counter and prescription medicines only as told by your health care provider. Do not stop taking medicines or start any new medicines without approval from your health care provider.  If you were prescribed an antibiotic medicine, take it as told by your health care provider. Do not stop taking the antibiotic even if you start to feel better.  Do not drive or use heavy machinery while taking prescription pain medicine. Incision care  Follow instructions from your health care provider about how to take care of your incisions. Make sure you: ? Wash your hands with soap and water before you change your bandage (dressing). If soap and water are not available, use hand sanitizer. ? Change your dressing as told by your health care provider. ? Leave stitches (sutures), skin glue, or adhesive strips in place. These skin closures may need to stay in place for 2 weeks or longer. If adhesive strip edges start to loosen and curl up, you may trim the loose edges. Do not remove adhesive strips completely unless your health care provider tells you to do that.  Keep incision areas clean, dry, and protected.  Check your incision areas every day for signs of infection. Check for: ? More redness, swelling, or pain. ? More fluid or blood. ? Warmth. ? Pus or a bad smell.  If incisions were made in your legs: ? Avoid crossing your legs. ? Avoid sitting for long periods of time. Change positions every 30  minutes. ? Raise (elevate) your legs when you are sitting. Bathing  Do not take  baths, swim, or use a hot tub until your health care provider approves.  Only take sponge baths. Pat the incisions dry. Do not rub incisions with a washcloth or towel.  Ask your health care provider when you can shower. Eating and drinking  Eat foods that are high in fiber, such as raw fruits and vegetables, whole grains, beans, and nuts. Meats should be lean cut. Avoid canned, processed, and fried foods. This can help prevent constipation and is a recommended part of a heart-healthy diet.  Drink enough fluid to keep your urine clear or pale yellow.  Limit alcohol intake to no more than 1 drink a day for nonpregnant women and 2 drinks a day for men. One drink equals 12 oz of beer, 5 oz of wine, or 1 oz of hard liquor. Activity  Rest and limit your activity as told by your health care provider. You may be instructed to: ? Stop any activity right away if you have chest pain, shortness of breath, irregular heartbeats, or dizziness. Get help right away if you have any of these symptoms. ? Move around frequently for short periods or take short walks as directed by your health care provider. Gradually increase your activities. You may need physical therapy or cardiac rehabilitation to help strengthen your muscles and build your endurance. ? Avoid lifting, pushing, or pulling anything that is heavier than 10 lb (4.5 kg) for at least 6 weeks or as told by your health care provider.  Do not drive until your health care provider approves.  Ask your health care provider when you may return to work.  Ask your health care provider when you may resume sexual activity. General instructions  Do not use any products that contain nicotine or tobacco, such as cigarettes and e-cigarettes. If you need help quitting, ask your health care provider.  Take 2-3 deep breaths every few hours during the day, while you recover. This  helps expand your lungs and prevent complications like pneumonia after surgery.  If you were given a device called an incentive spirometer, use it several times a day to practice deep breathing. Support your chest with a pillow or your arms when you take deep breaths or cough.  Wear compression stockings as told by your health care provider. These stockings help to prevent blood clots and reduce swelling in your legs.  Weigh yourself every day. This helps identify if your body is holding (retaining) fluid that may make your heart and lungs work harder.  Keep all follow-up visits as told by your health care provider. This is important. Contact a health care provider if:  You have more redness, swelling, or pain around any incision.  You have more fluid or blood coming from any incision.  Any incision feels warm to the touch.  You have pus or a bad smell coming from any incision  You have a fever.  You have swelling in your ankles or legs.  You have pain in your legs.  You gain 2 lb (0.9 kg) or more a day.  You are nauseous or you vomit.  You have diarrhea. Get help right away if:  You have chest pain that spreads to your jaw or arms.  You are short of breath.  You have a fast or irregular heartbeat.  You notice a "clicking" in your breastbone (sternum) when you move.  You have numbness or weakness in your arms or legs.  You feel dizzy or light-headed. Summary  After the procedure, it  is common to have pain or discomfort in the incision areas.  Do not take baths, swim, or use a hot tub until your health care provider approves.  Gradually increase your activities. You may need physical therapy or cardiac rehabilitation to help strengthen your muscles and build your endurance.  Weigh yourself every day. This helps identify if your body is holding (retaining) fluid that may make your heart and lungs work harder. This information is not intended to replace advice given  to you by your health care provider. Make sure you discuss any questions you have with your health care provider. Document Released: 09/15/2004 Document Revised: 01/16/2016 Document Reviewed: 01/16/2016 Elsevier Interactive Patient Education  2018 [employer].  Aortic Valve Replacement, Care After Refer to this sheet in the next few weeks. These instructions provide you with information about caring for yourself after your procedure. Your health care provider may also give you more specific instructions. Your treatment has been planned according to current medical practices, but problems sometimes occur. Call your health care provider if you have any problems or questions after your procedure. What can I expect after the procedure? After the procedure, it is common to have:  Pain around your incision area.  A small amount of blood or clear fluid coming from your incision.  Follow these instructions at home: Eating and drinking   Follow instructions from your health care provider about eating or drinking restrictions. ? Limit alcohol intake to no more than 1 drink per day for nonpregnant women and 2 drinks per day for men. One drink equals 12 oz of beer, 5 oz of wine, or 1 oz of hard liquor. ? Limit how much caffeine you drink. Caffeine can affect your heart's rate and rhythm.  Drink enough fluid to keep your urine clear or pale yellow.  Eat a heart-healthy diet. This should include plenty of fresh fruits and vegetables. If you eat meat, it should be lean cuts. Avoid foods that are: ? High in salt, saturated fat, or sugar. ? Canned or highly processed. ? Fried. Activity  Return to your normal activities as told by your health care provider. Ask your health care provider what activities are safe for you.  Exercise regularly once you have recovered, as told by your health care provider.  Avoid sitting for more than 2 hours at a time without moving. Get up and move around at least  once every 1-2 hours. This helps to prevent blood clots in the legs.  Do not lift anything that is heavier than 10 lb (4.5 kg) until your health care provider approves.  Avoid pushing or pulling things with your arms until your health care provider approves. This includes pulling on handrails to help you climb stairs. Incision care   Follow instructions from your health care provider about how to take care of your incision. Make sure you: ? Wash your hands with soap and water before you change your bandage (dressing). If soap and water are not available, use hand sanitizer. ? Change your dressing as told by your health care provider. ? Leave stitches (sutures), skin glue, or adhesive strips in place. These skin closures may need to stay in place for 2 weeks or longer. If adhesive strip edges start to loosen and curl up, you may trim the loose edges. Do not remove adhesive strips completely unless your health care provider tells you to do that.  Check your incision area every day for signs of infection. Check for: ? More  redness, swelling, or pain. ? More fluid or blood. ? Warmth. ? Pus or a bad smell. Medicines  Take over-the-counter and prescription medicines only as told by your health care provider.  If you were prescribed an antibiotic medicine, take it as told by your health care provider. Do not stop taking the antibiotic even if you start to feel better. Travel  Avoid airplane travel for as long as told by your health care provider.  When you travel, bring a list of your medicines and a record of your medical history with you. Carry your medicines with you. Driving  Ask your health care provider when it is safe for you to drive. Do not drive until your health care provider approves.  Do not drive or operate heavy machinery while taking prescription pain medicine. Lifestyle   Do not use any tobacco products, such as cigarettes, chewing tobacco, or e-cigarettes. If you need  help quitting, ask your health care provider.  Resume sexual activity as told by your health care provider. Do not use medicines for erectile dysfunction unless your health care provider approves, if this applies.  Work with your health care provider to keep your blood pressure and cholesterol under control, and to manage any other heart conditions that you have.  Maintain a healthy weight. General instructions  Do not take baths, swim, or use a hot tub until your health care provider approves.  Do not strain to have a bowel movement.  Avoid crossing your legs while sitting down.  Check your temperature every day for a fever. A fever may be a sign of infection.  If you are a woman and you plan to become pregnant, talk with your health care provider before you become pregnant.  Wear compression stockings if your health care provider instructs you to do this. These stockings help to prevent blood clots and reduce swelling in your legs.  Tell all health care providers who care for you that you have an artificial (prosthetic) aortic valve. If you have or have had heart disease or endocarditis, tell all health care providers about these conditions as well.  Keep all follow-up visits as told by your health care provider. This is important. Contact a health care provider if:  You develop a skin rash.  You experience sudden, unexplained changes in your weight.  You have more redness, swelling, or pain around your incision.  You have more fluid or blood coming from your incision.  Your incision feels warm to the touch.  You have pus or a bad smell coming from your incision.  You have a fever. Get help right away if:  You develop chest pain that is different from the pain coming from your incision.  You develop shortness of breath or difficulty breathing.  You start to feel light-headed. These symptoms may represent a serious problem that is an emergency. Do not wait to see if the  symptoms will go away. Get medical help right away. Call your local emergency services (911 in the [place] Do not drive yourself to the hospital. This information is not intended to replace advice given to you by your health care provider. Make sure you discuss any questions you have with your health care provider. Document Released: 09/14/2004 Document Revised: 08/04/2015 Document Reviewed: 01/30/2015 Elsevier Interactive Patient Education  2017 [employer].

## 2016-12-16 NOTE — Progress Notes (Signed)
SATURATION QUALIFICATIONS: (This note is used to comply with regulatory documentation for home oxygen)  Patient Saturations on Room Air at Rest = 90%  Patient Saturations on Room Air while Ambulating =84%  Patient Saturations on 2Liters of oxygen while Ambulating = 91% Please briefly explain why patient needs home oxygen: 

## 2016-12-16 NOTE — Progress Notes (Signed)
Discharged to home with family office visits in place teaching done  

## 2016-12-17 ENCOUNTER — Other Ambulatory Visit: Payer: Self-pay | Admitting: Physician Assistant

## 2016-12-17 ENCOUNTER — Telehealth: Payer: Self-pay

## 2016-12-17 ENCOUNTER — Telehealth (HOSPITAL_COMMUNITY): Payer: Self-pay | Admitting: Nurse Practitioner

## 2016-12-17 DIAGNOSIS — N3001 Acute cystitis with hematuria: Secondary | ICD-10-CM

## 2016-12-17 MED ORDER — CIPROFLOXACIN HCL 500 MG PO TABS: 500.0000 mg | ORAL_TABLET | Freq: Two times a day (BID) | ORAL | 0 refills | Status: AC

## 2016-12-17 NOTE — Telephone Encounter (Signed)
RX for Cipro 500 mg BID x 5 days called to pharm per Dr Owen/  Referral sent to [facility] in [place] for nursing/PT/OT

## 2016-12-18 NOTE — Telephone Encounter (Signed)
Patient contacted regarding discharge from [facility]  on 12/16/16.   Patient understands to follow up with provider ? On 12/26/16 at 4:20pm at [facility] with Dr End.  Patient understands discharge instructions? Yes   Patient understands medications and regiment? Yes   Patient understands to bring all medications to this visit? Yes   Patient is hard of hearing. Patient gave permission for me to speak to wife. She said she was not sure if the patient would be able to make it to that appointment on 12/26/16. Patient went through a big surgery and was having difficulty to adjusting at home. PT and Home health start tomorrow at the house. I advised and stressed the importance of follow up with cardiology next week as advised. I encouraged wife that patient may be a little better by then and able to make the travel to the appointment. She was hopeful and has support of her children as well.

## 2016-12-19 ENCOUNTER — Encounter: Payer: Self-pay | Admitting: *Deleted

## 2016-12-19 DIAGNOSIS — Z48812 Encounter for surgical aftercare following surgery on the circulatory system: Secondary | ICD-10-CM | POA: Diagnosis not present

## 2016-12-19 NOTE — Progress Notes (Unsigned)
Alvis Lemmings unable to accept referral in [facility]. Referred to [facility] in [place]. Spoke with [facility] @ [phone]. Faxed H and P, F2F, HHPT and HHRN orders as well as orders to fax @ [phone]. Called pt and made him aware of change and that someone from the [facility] office would be in touch with him. He expressed his appreciation. No further CM needs at this time.

## 2016-12-26 ENCOUNTER — Ambulatory Visit: Payer: Medicare Other | Admitting: Internal Medicine

## 2017-01-11 ENCOUNTER — Other Ambulatory Visit: Payer: Self-pay | Admitting: Thoracic Surgery (Cardiothoracic Vascular Surgery)

## 2017-01-11 ENCOUNTER — Emergency Department (HOSPITAL_COMMUNITY): Payer: Medicare Other

## 2017-01-11 ENCOUNTER — Encounter (HOSPITAL_COMMUNITY): Payer: Self-pay

## 2017-01-11 ENCOUNTER — Emergency Department (HOSPITAL_COMMUNITY)
Admission: EM | Admit: 2017-01-11 | Discharge: 2017-01-11 | Discharge status: Against Medical Advice | Payer: Medicare Other | Attending: Emergency Medicine | Admitting: Emergency Medicine

## 2017-01-11 DIAGNOSIS — Z8546 Personal history of malignant neoplasm of prostate: Secondary | ICD-10-CM | POA: Diagnosis not present

## 2017-01-11 DIAGNOSIS — Z951 Presence of aortocoronary bypass graft: Secondary | ICD-10-CM

## 2017-01-11 DIAGNOSIS — Z7982 Long term (current) use of aspirin: Secondary | ICD-10-CM | POA: Diagnosis not present

## 2017-01-11 DIAGNOSIS — R42 Dizziness and giddiness: Secondary | ICD-10-CM | POA: Insufficient documentation

## 2017-01-11 DIAGNOSIS — Z79899 Other long term (current) drug therapy: Secondary | ICD-10-CM | POA: Insufficient documentation

## 2017-01-11 DIAGNOSIS — I252 Old myocardial infarction: Secondary | ICD-10-CM | POA: Diagnosis not present

## 2017-01-11 DIAGNOSIS — I2510 Atherosclerotic heart disease of native coronary artery without angina pectoris: Secondary | ICD-10-CM | POA: Diagnosis not present

## 2017-01-11 DIAGNOSIS — Z7902 Long term (current) use of antithrombotics/antiplatelets: Secondary | ICD-10-CM | POA: Diagnosis not present

## 2017-01-11 DIAGNOSIS — R55 Syncope and collapse: Secondary | ICD-10-CM | POA: Diagnosis not present

## 2017-01-11 LAB — CBC WITH DIFFERENTIAL/PLATELET
BASOS ABS: 0.1 10*3/uL (ref 0.0–0.1)
BASOS PCT: 1 %
EOS ABS: 0.6 10*3/uL (ref 0.0–0.7)
Eosinophils Relative: 6 %
HEMATOCRIT: 30.1 % — AB (ref 39.0–52.0)
Hemoglobin: 9.5 g/dL — ABNORMAL LOW (ref 13.0–17.0)
Lymphocytes Relative: 14 %
Lymphs Abs: 1.4 10*3/uL (ref 0.7–4.0)
MCH: 27.9 pg (ref 26.0–34.0)
MCHC: 31.6 g/dL (ref 30.0–36.0)
MCV: 88.5 fL (ref 78.0–100.0)
MONOS PCT: 6 %
Monocytes Absolute: 0.6 10*3/uL (ref 0.1–1.0)
NEUTROS ABS: 7 10*3/uL (ref 1.7–7.7)
NEUTROS PCT: 73 %
PLATELETS: 311 10*3/uL (ref 150–400)
RBC: 3.4 MIL/uL — AB (ref 4.22–5.81)
RDW: 15.2 % (ref 11.5–15.5)
WBC: 9.6 10*3/uL (ref 4.0–10.5)

## 2017-01-11 LAB — PROTIME-INR
INR: 1.01
Prothrombin Time: 13.3 seconds (ref 11.4–15.2)

## 2017-01-11 LAB — COMPREHENSIVE METABOLIC PANEL
ALBUMIN: 3.4 g/dL — AB (ref 3.5–5.0)
ALT: 34 U/L (ref 17–63)
AST: 58 U/L — ABNORMAL HIGH (ref 15–41)
Alkaline Phosphatase: 182 U/L — ABNORMAL HIGH (ref 38–126)
Anion gap: 9 (ref 5–15)
BILIRUBIN TOTAL: 1.2 mg/dL (ref 0.3–1.2)
BUN: 16 mg/dL (ref 6–20)
CHLORIDE: 104 mmol/L (ref 101–111)
CO2: 23 mmol/L (ref 22–32)
CREATININE: 1.03 mg/dL (ref 0.61–1.24)
Calcium: 9 mg/dL (ref 8.9–10.3)
GFR calc Af Amer: >60 mL/min (ref >60)
GFR calc non Af Amer: >60 mL/min (ref >60)
GLUCOSE: 95 mg/dL (ref 65–99)
POTASSIUM: 4.4 mmol/L (ref 3.5–5.1)
Sodium: 136 mmol/L (ref 135–145)
Total Protein: 6.9 g/dL (ref 6.5–8.1)

## 2017-01-11 LAB — TROPONIN I: TROPONIN I: 0.97 ng/mL — AB (ref <0.03)

## 2017-01-11 MED ORDER — TETANUS-DIPHTHERIA TOXOIDS TD 5-2 LFU IM INJ
0.5000 mL | INJECTION | Freq: Once | INTRAMUSCULAR | Status: DC
  Filled 2017-01-11: qty 0.5

## 2017-01-11 NOTE — ED Provider Notes (Addendum)
[facility] EMERGENCY DEPARTMENT Provider Note   CSN: [id number] Arrival date & time: 01/11/17  1129     History   Chief Complaint Chief Complaint  Patient presents with  . Loss of Consciousness    HPI Kevin Burgess is a 76 y.o. male.  76 year old male presents with complaint of syncope. He reports that he was walking with his therapist when he became acutely dizzy and then "passed out." He struck his right temple against the mailbox as he fell - but otherwise denies injury. He denies current or prior to arrival - chest pain, shortness of breath, nausea, vomiting, or focal weakness. He reports recent admission and surgery (CABG x 2 and Aortic Aneurysm repair).    The history is provided by the patient.  Loss of Consciousness   This is a new problem. The current episode started 1 to 2 hours ago. The problem occurs rarely. The problem has not changed since onset.He lost consciousness for a period of less than one minute. The problem is associated with normal activity. Pertinent negatives include chest pain, clumsiness, dizziness, fever, malaise/fatigue, nausea, palpitations, vertigo, vomiting and weakness.    Past Medical History:  Diagnosis Date  . Aortic insufficiency   . Carotid artery stenosis, asymptomatic, right   . History of hiatal hernia   . Left carotid artery occlusion   . OA (osteoarthritis)    a. Pending R Hip THA  . Prostate cancer ([place])    a. s/p TURP ~ 1999.  . S/P ascending aortic aneurysm repair 12/07/2016   [place]emashield straight graft  . 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 Thoracic ascending aortic aneurysm [facility])     Patient Active Problem List   Diagnosis Date Noted  .  S/P Bentall root replacement with bioprosthetic valve + CABG x2 12/07/2016  . 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 S/P ascending aortic aneurysm repair 12/07/2016  . Coronary artery disease involving native coronary artery of native heart with unstable angina pectoris ([place])   . Aortic insufficiency   . Left carotid artery occlusion   . Carotid artery stenosis, asymptomatic, right   . 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 Thoracic ascending aortic aneurysm ([place]) 12/06/2016  . NSTEMI (non-ST elevated myocardial infarction) ([place]) 12/04/2016    Past Surgical History:  Procedure Laterality Date  . BENTALL PROCEDURE N/A 12/07/2016   Procedure: BIOLOGICAL BENTALL AORTIC ROOT REPLACEMENT;  Surgeon: Rexene Alberts, MD;  Location: [place];  Service: Open Heart Surgery;  Laterality: N/A;  . CORONARY ARTERY BYPASS GRAFT N/A 12/07/2016   Procedure: CORONARY ARTERY BYPASS GRAFTING (CABG) X 2;  Surgeon: Rexene Alberts, MD;  Location: [place];  Service: Open Heart Surgery;  Laterality: N/A;  . ENDOVEIN HARVEST OF GREATER SAPHENOUS VEIN Left 12/07/2016   Procedure: ENDOVEIN HARVEST OF GREATER SAPHENOUS VEIN;  Surgeon: Rexene Alberts, MD;  Location: [place];  Service: Open Heart Surgery;  Laterality: Left;  . LEFT HEART CATH AND CORONARY ANGIOGRAPHY N/A 12/04/2016   Procedure: LEFT HEART CATH AND CORONARY ANGIOGRAPHY;  Surgeon: Nelva Bush, MD;  Location: [facility] CV LAB;  Service: Cardiovascular;  Laterality: N/A;  . TEE WITHOUT  CARDIOVERSION N/A 12/07/2016   Procedure: TRANSESOPHAGEAL ECHOCARDIOGRAM (TEE);  Surgeon: Rexene Alberts, MD;  Location: [place];  Service: Open Heart Surgery;  Laterality: N/A;  . THORACIC AORTIC ANEURYSM REPAIR N/A 12/07/2016   Procedure: THORACIC ASCENDING ANEURYSM REPAIR (AAA);  Surgeon: Rexene Alberts, MD;  Location: [place];  Service: Open Heart Surgery;  Laterality: N/A;  . TRANSURETHRAL RESECTION OF PROSTATE     a. ~ 1999       Home Medications    Prior to  Admission medications   Medication Sig Start Date End Date Taking? Authorizing Provider  alendronate (FOSAMAX) 70 MG tablet Take 70 mg by mouth every Thursday.  09/27/16   [provider]  aspirin 81 MG chewable tablet Chew 1 tablet (81 mg total) by mouth daily. 12/05/16   Dustin Flock, MD  atorvastatin (LIPITOR) 80 MG tablet Take 1 tablet (80 mg total) by mouth daily at 6 PM. 12/05/16   Dustin Flock, MD  bicalutamide (CASODEX) 50 MG tablet Take 50 mg by mouth daily. 10/29/16   [provider]  carvedilol (COREG) 6.25 MG tablet Take 1 tablet (6.25 mg total) by mouth 2 (two) times daily with a meal. 12/16/16   Nani Skillern, PA-C  Cholecalciferol (VITAMIN D-3) 5000 units TABS Take 5,000 Units by mouth daily.    [provider]  clopidogrel (PLAVIX) 75 MG tablet TAKE 1 TABLET BY MOUTH DAILY 12/17/16   Lars Pinks M, PA-C  ferrous sulfate 325 (65 FE) MG tablet Take 1 tablet (325 mg total) by mouth daily. For one month then stop. 12/16/16 12/16/17  Lars Pinks M, PA-C  furosemide (LASIX) 40 MG tablet TAKE 1 TABLET BY MOUTH DAILY FOR 5 DAYS THEN STOP 12/17/16   Lars Pinks M, PA-C  omeprazole (PRILOSEC) 20 MG capsule Take 20 mg by mouth daily. 11/22/16   [provider]  traMADol (ULTRAM) 50 MG tablet Take 50 mg by mouth every 4-6 hours PRN severe pain. 12/16/16   Nani Skillern, PA-C  triamcinolone cream (KENALOG) 0.5 % Apply topically 2 (two) times daily. 12/16/16   Nani Skillern, PA-C    Family History No family history on file.  Social History Social History  Substance Use Topics  . Smoking status: Never Smoker  . Smokeless tobacco: Never Used  . Alcohol use No     Allergies   Patient has no known allergies.   Review of Systems Review of Systems  Constitutional: Negative for fever and malaise/fatigue.  Cardiovascular: Positive for syncope. Negative for chest pain and palpitations.  Gastrointestinal:  Negative for nausea and vomiting.  Neurological: Negative for dizziness, vertigo and weakness.  All other systems reviewed and are negative.    Physical Exam Updated Vital Signs BP (!) 160/86   Pulse 82   Temp 98.5 F (36.9 C) (Oral)   Resp 20   SpO2 99%   Physical Exam  Constitutional: He is oriented to person, place, and time. He appears well-developed and well-nourished.  HENT:  Head: Normocephalic.  Mouth/Throat: Oropharynx is clear and moist.  Small abrasion to right temple - no active bleeding - no laceration  Eyes: Pupils are equal, round, and reactive to light. Conjunctivae and EOM are normal.  Neck: Normal range of motion. Neck supple.  Cardiovascular: Normal rate, regular rhythm and normal heart sounds.   No murmur heard. Well healed anterior chest incision   Pulmonary/Chest: Effort normal and breath sounds normal. No respiratory distress.  Abdominal: Soft. Bowel sounds are normal. He  exhibits no distension. There is no tenderness.  Musculoskeletal: Normal range of motion. He exhibits no edema.  Neurological: He is alert and oriented to person, place, and time.  Skin: Skin is warm and dry.  Psychiatric: He has a normal mood and affect.  Nursing note and vitals reviewed.    ED Treatments / Results  Labs (all labs ordered are listed, but only abnormal results are displayed) Labs Reviewed  TROPONIN I  CBC WITH DIFFERENTIAL/PLATELET  COMPREHENSIVE METABOLIC PANEL  PROTIME-INR    EKG  EKG Interpretation  Date/Time:  Friday January 11 2017 11:30:29 EDT Ventricular Rate:  82 PR Interval:    QRS Duration: 90 QT Interval:  379 QTC Calculation: 443 R Axis:   46 Text Interpretation:  Sinus rhythm Probable inferior infarct, old Abnrm T, consider ischemia, anterolateral lds Confirmed by Dene Gentry [phone]) on 01/11/2017 11:39:04 AM       Radiology No results found.  Procedures Procedures (including critical care time)  Medications Ordered in  ED Medications  tetanus & diphtheria toxoids (adult) (TENIVAC) injection 0.5 mL (not administered)     Initial Impression / Assessment and Plan / ED Course  I have reviewed the triage vital signs and the nursing notes.  Pertinent labs & imaging results that were available during my care of the patient were reviewed by me and considered in my medical decision making (see chart for details).     MSE - Screening Complete   Patients syncope is most likely secondary to deconditioning and brief orthostatic hypotension. Slightly elevated troponin is most likely secondary to recent cardiac surgery. However, I have only one data point and would prefer to obtain a delta troponin. The patient declines to wait for this - he desires to go home now. He understands the inherent risks involved in leaving AMA. Family present at bedside during [place] discussion and they also understand the risks. However, the patient has capacity to refuse care and is adamantly refusing further ED observation and/or admission. He is well appearing and comfortable at time of leaving the ED. He has already scheduled FU arranged for 3 days from today with CT Surgery. Strict return precautions given and understood.   Final Clinical Impressions(s) / ED Diagnoses   Final diagnoses:  Syncope    New Prescriptions New Prescriptions   No medications on file     Valarie Merino, MD 01/11/17 1450    Valarie Merino, MD 01/11/17 1500

## 2017-01-11 NOTE — ED Notes (Signed)
Patient transported to CT 

## 2017-01-11 NOTE — ED Triage Notes (Signed)
Pt presents via ems for evaluation of syncopal episode today during physical therapy. Pt with heart surgery 3 weeks ago. States feels back to baseline now. States similar episode of lightheadedness Tuesday. Pt hit R side of face on mailbox.

## 2017-01-11 NOTE — ED Notes (Signed)
Date and time results received: 01/11/17 12:49 PM  Test: troponin Critical Value: 0.97  Name of Provider Notified: Francia Greaves MD

## 2017-01-14 ENCOUNTER — Other Ambulatory Visit: Payer: Self-pay | Admitting: *Deleted

## 2017-01-14 ENCOUNTER — Encounter: Payer: Self-pay | Admitting: Thoracic Surgery (Cardiothoracic Vascular Surgery)

## 2017-01-14 ENCOUNTER — Ambulatory Visit (INDEPENDENT_AMBULATORY_CARE_PROVIDER_SITE_OTHER): Payer: Self-pay | Admitting: Thoracic Surgery (Cardiothoracic Vascular Surgery)

## 2017-01-14 ENCOUNTER — Ambulatory Visit
Admission: RE | Admit: 2017-01-14 | Discharge: 2017-01-14 | Disposition: A | Discharge status: Home / Self Care | Payer: Medicare Other | Source: Ambulatory Visit | Attending: Thoracic Surgery (Cardiothoracic Vascular Surgery) | Admitting: Thoracic Surgery (Cardiothoracic Vascular Surgery)

## 2017-01-14 VITALS — BP 137/92 | HR 80 | Ht 67.0 in | Wt 148.0 lb

## 2017-01-14 DIAGNOSIS — Z952 Presence of prosthetic heart valve: Secondary | ICD-10-CM

## 2017-01-14 DIAGNOSIS — I3139 Other pericardial effusion (noninflammatory): Secondary | ICD-10-CM

## 2017-01-14 DIAGNOSIS — Z953 Presence of xenogenic heart valve: Secondary | ICD-10-CM

## 2017-01-14 DIAGNOSIS — Z951 Presence of aortocoronary bypass graft: Secondary | ICD-10-CM

## 2017-01-14 DIAGNOSIS — Z8679 Personal history of other diseases of the circulatory system: Secondary | ICD-10-CM

## 2017-01-14 DIAGNOSIS — Z9889 Other specified postprocedural states: Secondary | ICD-10-CM

## 2017-01-14 DIAGNOSIS — I313 Pericardial effusion (noninflammatory): Secondary | ICD-10-CM

## 2017-01-14 NOTE — Patient Instructions (Signed)
Continue all previous medications without any changes at this time  Call Dr Darnelle Bos office and schedule f/u appointment as soon as practical  Continue to avoid any heavy lifting or strenuous use of your arms or shoulders for at least a total of three months from the time of surgery.  After three months you may gradually increase how much you lift or otherwise use your arms or chest as tolerated, with limits based upon whether or not activities lead to the return of significant discomfort.

## 2017-01-14 NOTE — Progress Notes (Signed)
[address]       [place] [address]             [phone]     CARDIOTHORACIC SURGERY OFFICE NOTE  Referring Provider is End, Harrell Gave, MD PCP is [name], MD   HPI:  Patient is a 76 year old male who presented to [facility] 03, 2016 with acute systolic congestive heart failure and non-ST segment elevation myocardial infarction.  He underwent diagnostic cardiac catheterization and was found to have severe three-vessel coronary artery disease.  He was transferred to [facility] where echocardiogram revealed dilated ascending aorta, moderate aortic insufficiency, and moderate left ventricular systolic dysfunction with ejection fraction estimated 40%.  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  At the time of surgery he was found to have a descending thoracic aortic aneurysm with maximum diameter 4.8 cm and moderate central aortic insufficiency.  His postoperative recovery was essentially uncomplicated and he was ultimately discharged home with home health nursing care on the ninth postoperative day.  He returns to our office today for routine follow-up.    Patient was seen in the emergency department on January 11, 2017 for a brief syncopal event that was felt to be secondary to orthostatic hypotension.  The patient states that he had been walking outdoors at the time and was walking up a hill when he felt dizzy and briefly passed out.  He fell down and scraped his forehead.  He had very brief episode of loss of consciousness, regaining normal consciousness within a few seconds.  He was brought to the emergency room  where EKG performed revealed sinus rhythm and normal blood pressure..  Hemoglobin was 9.5 up from 8.3 at the time of hospital discharge.  A brain CT scan was performed and unrevealing.  Patient returns to our office today for follow-up.  He states that he feels quite well.  He does not have any significant pain in his chest at all.  He has numbness involving the fourth and fifth fingers of the left hand which extends up to the elbow.  This has persisted ever since surgery, consistent with likely ulnar nerve compression.  He otherwise has no complaints.  He denies any fevers or chills.  He has not had any dysuria.  He was treated for a possible urinary infection a couple of weeks ago.  Appetite is good.  His wife thinks he is not drinking enough fluids.  Overall he feels fine.  He specifically denies any shortness of breath.  He has not had any other dizzy spells.  Current Outpatient Medications  Medication Sig Dispense Refill  . alendronate (FOSAMAX) 70 MG tablet Take 70 mg by mouth every Thursday.     Marland Kitchen aspirin 81 MG chewable tablet Chew 1 tablet (81 mg total) by mouth daily.    Marland Kitchen atorvastatin (LIPITOR) 80 MG tablet Take 1 tablet (80 mg total) by mouth daily at 6 PM.    . bicalutamide (CASODEX) 50 MG tablet Take 50 mg by mouth daily.    . carvedilol (COREG) 6.25 MG tablet Take 1 tablet (6.25 mg total) by mouth 2 (two) times daily with a meal. 60 tablet 1  . Cholecalciferol (VITAMIN D-3)  5000 units TABS Take 5,000 Units by mouth daily.    . clopidogrel (PLAVIX) 75 MG tablet TAKE 1 TABLET BY MOUTH DAILY 90 tablet 1  . ferrous sulfate 325 (65 FE) MG tablet Take 1 tablet (325 mg total) by mouth daily. For one month then stop. 30 tablet 3  . metFORMIN (GLUCOPHAGE) 500 MG tablet Take 500 mg by mouth.    Marland Kitchen omeprazole (PRILOSEC) 20 MG capsule Take 20 mg by mouth daily.    . traMADol (ULTRAM) 50 MG tablet Take 50 mg by mouth every 4-6 hours PRN severe pain. 28 tablet 0   No current facility-administered  medications for this visit.       Physical Exam:   BP (!) 137/92   Pulse 80   Ht 5\' 7"  (1.702 m)   Wt 148 lb (67.1 kg)   SpO2 97%   BMI 23.18 kg/m   General:  Well-appearing  Chest:   Clear to auscultation  CV:   Regular rate and rhythm with soft systolic murmur heard along the right sternal border  Incisions:  Healing nicely with small dry eschar inferior aspect of sternotomy incision and overlying 3 of 4 chest tube incisions  Abdomen:  Soft nontender  Extremities:  Warm and well perfused with no lower extremity edema  Diagnostic Tests:  CT HEAD WITHOUT CONTRAST  TECHNIQUE: Contiguous axial images were obtained from the base of the skull through the vertex without intravenous contrast.  COMPARISON:  None.  FINDINGS: Brain: No evidence of acute infarction, hemorrhage, hydrocephalus, extra-axial collection or mass lesion/mass effect. Note is made of moderate parenchymal volume loss and periventricular white matter changes.  Vascular: No hyperdense vessel or unexpected calcification.  Skull: Normal. Negative for fracture or focal lesion.  Sinuses/Orbits: No acute finding.  Other: None.  IMPRESSION: 1. No acute intracranial pathology. 2. Moderate parenchymal volume loss and periventricular white matter changes, likely reflecting small vessel ischemic disease in a patient of this age.   Electronically Signed   By: Kristopher Oppenheim M.D.   On: 01/11/2017 14:29    CHEST  2 VIEW  COMPARISON:  Single-view of the chest 01/11/2017. PA and lateral chest 12/12/2016.  FINDINGS: Eight intact median sternotomy wires are unchanged. Mild subsegmental atelectasis is seen in the lung bases. Lungs are otherwise clear. No pneumothorax or pleural effusion. No focal bony abnormality. Aortic atherosclerosis noted.  IMPRESSION: No acute disease.  Mild bibasilar subsegmental atelectasis.   Electronically Signed   By: Inge Rise M.D.   On: 01/14/2017  11:50   Impression:  Patient overall looks quite good at this point in time more than 1 month status post Bentall aortic root replacement using a bioprosthetic tissue valve and synthetic root conduit, reimplantation of left main and right coronary arteries, and coronary artery bypass grafting x2.  Patient appears to be making good progress although he did experience a very brief syncopal episode 3 days ago.  The event reportedly occurred while the patient was walking up a hill and pushing himself a bit more than he had been previously.  He has not had any other dizzy spells.  He states that he does feel his heart pounding occasionally.  He was seen in the emergency department where EKG revealed sinus rhythm and blood pressure was normal.  Brain CT was normal.  Hemoglobin was up appropriately from the time of hospital discharge.   Plan:  We have not recommended any changes to the patient's current medications.  We will send the patient for  transthoracic echocardiogram as soon as possible to make sure there is no sign of significant pericardial effusion.  I recommended that patient call and schedule a routine follow-up appointment as soon as practical with Dr. Saunders Revel.  I have encouraged the patient to continue to gradually increase his physical activity but refrain from any sort of heavy lifting or strenuous physical exertion.  He would benefit from participation in the outpatient cardiac rehab program, although he lives a long distance away from [place] or [place].  Because of his brief syncopal episode 3 days ago, I have instructed the patient not to resume driving an automobile.  All of his questions have been addressed.  The patient will return to our office in approximately 2 months for routine follow-up.  He will call and return sooner should specific problems or questions arise.    Valentina Gu. Roxy Manns, MD 01/14/2017 12:29 PM

## 2017-01-22 ENCOUNTER — Ambulatory Visit (HOSPITAL_COMMUNITY)
Admission: RE | Admit: 2017-01-22 | Discharge: 2017-01-22 | Disposition: A | Discharge status: Home / Self Care | Payer: Medicare Other | Source: Ambulatory Visit | Attending: Thoracic Surgery (Cardiothoracic Vascular Surgery) | Admitting: Thoracic Surgery (Cardiothoracic Vascular Surgery)

## 2017-01-22 DIAGNOSIS — I3139 Other pericardial effusion (noninflammatory): Secondary | ICD-10-CM

## 2017-01-22 DIAGNOSIS — I2510 Atherosclerotic heart disease of native coronary artery without angina pectoris: Secondary | ICD-10-CM | POA: Insufficient documentation

## 2017-01-22 DIAGNOSIS — Z952 Presence of prosthetic heart valve: Secondary | ICD-10-CM | POA: Diagnosis not present

## 2017-01-22 DIAGNOSIS — Z953 Presence of xenogenic heart valve: Secondary | ICD-10-CM | POA: Diagnosis not present

## 2017-01-22 DIAGNOSIS — I313 Pericardial effusion (noninflammatory): Secondary | ICD-10-CM | POA: Diagnosis present

## 2017-01-22 DIAGNOSIS — Z951 Presence of aortocoronary bypass graft: Secondary | ICD-10-CM | POA: Diagnosis not present

## 2017-01-22 DIAGNOSIS — I252 Old myocardial infarction: Secondary | ICD-10-CM | POA: Insufficient documentation

## 2017-01-22 DIAGNOSIS — I081 Rheumatic disorders of both mitral and tricuspid valves: Secondary | ICD-10-CM | POA: Diagnosis not present

## 2017-01-22 NOTE — Progress Notes (Signed)
  Echocardiogram 2D Echocardiogram has been performed.  Kevin Burgess 01/22/2017, 3:10 PM

## 2017-02-06 ENCOUNTER — Encounter: Payer: Self-pay | Admitting: Nurse Practitioner

## 2017-02-06 ENCOUNTER — Ambulatory Visit: Payer: Medicare Other | Admitting: Nurse Practitioner

## 2017-02-06 ENCOUNTER — Other Ambulatory Visit: Payer: Self-pay | Admitting: Physician Assistant

## 2017-02-06 VITALS — BP 160/80 | HR 64 | Ht 67.0 in | Wt 153.0 lb

## 2017-02-06 DIAGNOSIS — I351 Nonrheumatic aortic (valve) insufficiency: Secondary | ICD-10-CM | POA: Diagnosis not present

## 2017-02-06 DIAGNOSIS — E785 Hyperlipidemia, unspecified: Secondary | ICD-10-CM

## 2017-02-06 DIAGNOSIS — I2510 Atherosclerotic heart disease of native coronary artery without angina pectoris: Secondary | ICD-10-CM | POA: Diagnosis not present

## 2017-02-06 DIAGNOSIS — R55 Syncope and collapse: Secondary | ICD-10-CM

## 2017-02-06 DIAGNOSIS — I10 Essential (primary) hypertension: Secondary | ICD-10-CM | POA: Diagnosis not present

## 2017-02-06 MED ORDER — LISINOPRIL 2.5 MG PO TABS: 2.5000 mg | ORAL_TABLET | Freq: Every day | ORAL | 3 refills | Status: DC

## 2017-02-06 NOTE — Patient Instructions (Addendum)
Medication Instructions:  Your physician has recommended you make the following change in your medication:  START taking lisinopril 2.5mg  once daily    Labwork: BMET at PCP in one week. You have been given a written prescription to take.  Testing/Procedures: Your physician has recommended that you wear an event monitor. Event monitors are medical devices that record the heart's electrical activity. Doctors most often [place] these monitors to diagnose arrhythmias. Arrhythmias are problems with the speed or rhythm of the heartbeat. The monitor is a small, portable device. You can wear one while you do your normal daily activities. This is usually used to diagnose what is causing palpitations/syncope (passing out).  You will receive a call from [employer] to verify insurance information and mailing address. The monitor will then be mailed to your home.   Follow-Up: Your physician recommends that you schedule a follow-up appointment in: 6-8 weeks with Dr. Saunders Revel.    Any Other Special Instructions Will Be Listed Below (If Applicable).     If you need a refill on your cardiac medications before your next appointment, please call your pharmacy.  eve

## 2017-02-06 NOTE — Progress Notes (Signed)
Office Visit    Patient Name: Kevin Burgess Date of Encounter: 02/06/2017  Primary Care Provider:  [name], MD Primary Cardiologist:  Andree Coss, MD   Chief Complaint    76 year old male with a history of prostate cancer, hypertension, and recent non-STEMI with finding of multivessel coronary artery disease, aortic insufficiency, and dilated aortic root, now status post 2 vessel bypass, aortic valve replacement, and Bentall.  Past Medical History    Past Medical History:  Diagnosis Date  . Aortic insufficiency    a. 10/2016 Echo: Mod AI w/ Ao root dil-->s/p Bentall and AVR w/ [facility] Ease pericardial tissue valve (20mm, model # [id number], ser # [id number]).  Marland Kitchen CAD (coronary artery disease)    a. 11/2016 NSTEMI/Cath: LM nl, LAD 80ost, 70p, 5m, D1 50ost, D2 70, RI nl, LCX 80ost, RCA 95ost/p, 55m, RPDA 90ost, EF 35-45%; b. 11/2016 CABG x 2: LIMA->LAD, VG->RPDA (@ time of Bentall/AVR).  . Carotid artery stenosis, asymptomatic, right   . History of hiatal hernia   . Ischemic cardiomyopathy    a. 11/2016 Echo: EF 40%, diff HK, mild LVH, Gr2 DD, mod AI, 65mm Asc Ao, mild MR, mildly dil LA; b. 01/2017 Echo: EF 40-45%, mild LVH, diff HK, Gr1 DD, Ao bioprosthesis, mild MR, mod TR.  Marland Kitchen Left carotid artery occlusion   . OA (osteoarthritis)    a. Pending R Hip THA  . Prostate cancer ([place])    a. s/p TURP ~ 1999.  . S/P ascending aortic aneurysm repair 12/07/2016   Hem[place]ield straight graft  . 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 Thoracic ascending aortic aneurysm [facility])    Past Surgical History:  Procedure Laterality Date  . BENTALL PROCEDURE N/A 12/07/2016   Procedure: BIOLOGICAL BENTALL AORTIC ROOT REPLACEMENT;   Surgeon: Rexene Alberts, MD;  Location: [place];  Service: Open Heart Surgery;  Laterality: N/A;  . CORONARY ARTERY BYPASS GRAFT N/A 12/07/2016   Procedure: CORONARY ARTERY BYPASS GRAFTING (CABG) X 2;  Surgeon: Rexene Alberts, MD;  Location: [place];  Service: Open Heart Surgery;  Laterality: N/A;  . ENDOVEIN HARVEST OF GREATER SAPHENOUS VEIN Left 12/07/2016   Procedure: ENDOVEIN HARVEST OF GREATER SAPHENOUS VEIN;  Surgeon: Rexene Alberts, MD;  Location: [place];  Service: Open Heart Surgery;  Laterality: Left;  . LEFT HEART CATH AND CORONARY ANGIOGRAPHY N/A 12/04/2016   Procedure: LEFT HEART CATH AND CORONARY ANGIOGRAPHY;  Surgeon: Nelva Bush, MD;  Location: [place] [facility];  Service: Cardiovascular;  Laterality: N/A;  . TEE WITHOUT CARDIOVERSION N/A 12/07/2016   Procedure: TRANSESOPHAGEAL ECHOCARDIOGRAM (TEE);  Surgeon: Rexene Alberts, MD;  Location: [place];  Service: Open Heart Surgery;  Laterality: N/A;  . THORACIC AORTIC ANEURYSM REPAIR N/A 12/07/2016   Procedure: THORACIC ASCENDING ANEURYSM REPAIR (AAA);  Surgeon: Rexene Alberts, MD;  Location: [place];  Service: Open Heart Surgery;  Laterality: N/A;  . TRANSURETHRAL RESECTION OF PROSTATE     a. ~ 1999    Allergies  No Known Allergies  History of Present Illness    76 year old male with the above complex past medical history including prostate cancer and hypertension. He was recently admitted to  [place] [facility] with progressive dyspnea on exertion and periumbilical pain. He was initially seen at [facility] in the emergency department was noted to have an elevated troponin. He was transferred to [facility] for further evaluation. There, troponin was slightly higher at 0.59. On further questioning, he reported substernal chest discomfort. Echocardiogram was performed that showed an EF of 40% with moderate aortic insufficiency and a 45 mm ascending aorta. Catheterization was performed and revealed native multivessel  disease predominantly involving the LAD and RPDA. Patient was transferred to [facility] and seen by CT surgery. Additional preoperative workup revealed a chronic occlusion of the left internal carotid artery and moderate stenosis of the right internal carotid artery. He subsequently underwent Bentall procedure with CABG 2 and bioprosthetic aortic valve replacement. At the time of surgery, he was also found to have a descending thoracic aortic aneurysm with a maximum diameter of 4.8 cm. Postoperative course was relatively uncomplicated.  Since his discharge, he has done reasonably well though, he was seen in the emergency department on November 2 and the setting of a syncopal spell that occurred at home while he was working with a therapist. He says his legs felt weak but he was witnessed actually lose consciousness. He was assisted to the ground and per family, regaining consciousness in just a minute or so. There are no acute findings in the emergency department he was discharged home. He has since followed up with CT surgery. He has had no recurrence of presyncope or syncope. He denies chest pain, dyspnea, palpitations, PND, orthopnea, edema, or early satiety.  Home Medications    Prior to Admission medications   Medication Sig Start Date End Date Taking? Authorizing Provider  alendronate (FOSAMAX) 70 MG tablet Take 70 mg by mouth every Thursday.  09/27/16   [provider]  aspirin 81 MG chewable tablet Chew 1 tablet (81 mg total) by mouth daily. 12/05/16   Dustin Flock, MD  atorvastatin (LIPITOR) 80 MG tablet Take 1 tablet (80 mg total) by mouth daily at 6 PM. 12/05/16   Dustin Flock, MD  bicalutamide (CASODEX) 50 MG tablet Take 50 mg by mouth daily. 10/29/16   [provider]  carvedilol (COREG) 6.25 MG tablet Take 1 tablet (6.25 mg total) by mouth 2 (two) times daily with a meal. 12/16/16   Nani Skillern, PA-C  Cholecalciferol (VITAMIN D-3) 5000 units TABS Take 5,000  Units by mouth daily.    [provider]  clopidogrel (PLAVIX) 75 MG tablet TAKE 1 TABLET BY MOUTH DAILY 12/17/16   Lars Pinks M, PA-C  ferrous sulfate 325 (65 FE) MG tablet Take 1 tablet (325 mg total) by mouth daily. For one month then stop. 12/16/16 12/16/17  Nani Skillern, PA-C  lisinopril (PRINIVIL,ZESTRIL) 2.5 MG tablet Take 1 tablet (2.5 mg total) by mouth daily. 02/06/17 05/07/17  Rogelia Mire, NP  metFORMIN (GLUCOPHAGE) 500 MG tablet Take 500 mg by mouth. 01/07/17   [provider]  omeprazole (PRILOSEC) 20 MG capsule Take 20 mg by mouth daily. 11/22/16   [provider]  traMADol (ULTRAM) 50 MG tablet Take 50 mg by mouth every 4-6 hours PRN severe pain. 12/16/16   Nani Skillern, PA-C    Review of Systems    Syncopal episode as outlined above. He otherwise denies chest pain, palpitations, PND, orthopnea, edema, or early satiety.  All other systems reviewed and are otherwise negative except as noted above.  Physical Exam    VS:  BP Marland Kitchen)  160/80 (BP Location: Left Arm, Patient Position: Sitting, Cuff Size: Normal)   Pulse 64   Ht 5\' 7"  (1.702 m)   Wt 153 lb (69.4 kg)   BMI 23.96 kg/m  , BMI Body mass index is 23.96 kg/m. GEN: Well nourished, well developed, in no acute distress.  HEENT: normal.  Neck: Supple, no JVD, carotid bruits, or masses. Cardiac: RRR, 2/6 systolic murmur at the right upper sternal border, no rubs, or gallops. No clubbing, cyanosis, edema.  Radials/DP/PT 2+ and equal bilaterally. Midsternal surgical scar has healed well. Respiratory:  Respirations regular and unlabored, clear to auscultation bilaterally. GI: Soft, nontender, nondistended, BS + x 4. MS: no deformity or atrophy. Skin: warm and dry, no rash. Neuro:  Strength and sensation are intact. Psych: Normal affect.  Accessory Clinical Findings    ECG - for sinus rhythm, 64, LVH, inferior infarct with anterolateral T-wave inversions. No acute  changes.  Assessment & Plan    1.  Coronary artery disease: Status post recent non-STEMI and two-vessel CABG. Doing well without chest discomfort. He remains on aspirin, statin, beta blocker, and Plavix therapy. Blood pressure is elevated today in the setting of LV dysfunction, I will add lisinopril 2.5 mg daily. Follow basic metabolic panel in 1 week. Patient and family do not think that he will be able to follow-up in cardiac rehabilitation locally. We can look into see was available in [facility].  2. Aortic insufficiency: Status post aortic valve replacement with bioprosthetic valve. Follow-up echo showed a normally functioning aortic valve bioprosthesis.  3. Essential hypertension: Blood pressure elevated today. Adding lisinopril 2.5 mg daily. Follow-up basic metabolic panel in 1 week. I have given him a prescription for this so that he can take it to his local provider as he lives quite some distance from [place] and [place].  4. Hyperlipidemia: LDL was 188 in September. We'll need to arrange for follow-up fasting lipids and LFTs at return visit.  5. Ascending aortic aneurysm: Status post repair.  6. Carotid arterial disease: Continue aspirin and statin therapy.  7. Syncope: Patient suffered a syncopal spell while at home working with a therapist a few weeks ago. No recurrence. I'll place a 30 day event monitor to rule out arrhythmia.   8. Disposition: Follow-up in clinic in 6-8 weeks or sooner if necessary.   Murray Hodgkins, NP 02/06/2017, 3:54 PM

## 2017-02-12 ENCOUNTER — Ambulatory Visit (INDEPENDENT_AMBULATORY_CARE_PROVIDER_SITE_OTHER): Payer: Medicare Other

## 2017-02-12 DIAGNOSIS — R55 Syncope and collapse: Secondary | ICD-10-CM

## 2017-03-01 ENCOUNTER — Telehealth: Payer: Self-pay | Admitting: *Deleted

## 2017-03-01 NOTE — Telephone Encounter (Signed)
Received fax notification that no receiving transmission from patient's [employer] and unable to reach patient on 02/21/17.  Called [employer]. They have since s/w patient. His monitor was not working so they mailed him a Engineering geologist. His End of Study date has been extended to 03/18/2017.

## 2017-03-13 NOTE — Telephone Encounter (Signed)
Received Transmission report from [employer] 02/12/17-03/11/17 Placed in nurse box

## 2017-03-18 ENCOUNTER — Encounter: Payer: Self-pay | Admitting: Thoracic Surgery (Cardiothoracic Vascular Surgery)

## 2017-03-18 ENCOUNTER — Ambulatory Visit: Payer: Medicare Other | Admitting: Thoracic Surgery (Cardiothoracic Vascular Surgery)

## 2017-03-18 VITALS — BP 115/70 | HR 50 | Resp 18 | Ht 67.0 in | Wt 160.0 lb

## 2017-03-18 DIAGNOSIS — Z8679 Personal history of other diseases of the circulatory system: Secondary | ICD-10-CM

## 2017-03-18 DIAGNOSIS — Z9889 Other specified postprocedural states: Secondary | ICD-10-CM

## 2017-03-18 DIAGNOSIS — Z953 Presence of xenogenic heart valve: Secondary | ICD-10-CM | POA: Diagnosis not present

## 2017-03-18 DIAGNOSIS — Z951 Presence of aortocoronary bypass graft: Secondary | ICD-10-CM

## 2017-03-18 NOTE — Patient Instructions (Addendum)
Continue all previous medications without any changes at this time.  It would be reasonable to stop Plavix in 3 months but do not stop taking it without instructions from your cardiologist (Dr. Saunders Revel)  You may return to driving an automobile once you have been cleared by Dr. Saunders Revel with regards to your recent cardiac monitor test.  It would be wise to start driving only short distances during the daylight and gradually increase from there as you feel comfortable.  You may resume unrestricted physical activity without any particular limitations at this time.  Endocarditis is a potentially serious infection of heart valves or inside lining of the heart.  It occurs more commonly in patients with diseased heart valves (such as patient's with aortic or mitral valve disease) and in patients who have undergone heart valve repair or replacement.  Certain surgical and dental procedures may put you at risk, such as dental cleaning, other dental procedures, or any surgery involving the respiratory, urinary, gastrointestinal tract, gallbladder or prostate gland.   To minimize your chances for develooping endocarditis, maintain good oral health and seek prompt medical attention for any infections involving the mouth, teeth, gums, skin or urinary tract.    Always notify your doctor or dentist about your underlying heart valve condition before having any invasive procedures. You will need to take antibiotics before certain procedures, including all routine dental cleanings or other dental procedures.  Your cardiologist or dentist should prescribe these antibiotics for you to be taken ahead of time.

## 2017-03-18 NOTE — Progress Notes (Signed)
[address]       ,[place] [address]             [phone]     CARDIOTHORACIC SURGERY OFFICE NOTE  Referring Provider is End, Harrell Gave, MD PCP is [name], MD   HPI:  Patient is a 77 year old male who presented to [facility] December 03, 2016 with acute systolic congestive heart failure and non-ST segment elevation myocardial infarction.  He underwent diagnostic cardiac catheterization and was found to have severe three-vessel coronary artery disease.  He was transferred to [facility] where echocardiogram revealed dilated ascending aorta, moderate aortic insufficiency, and moderate left ventricular systolic dysfunction with ejection fraction estimated 40%.  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  His postoperative recovery was essentially uncomplicated and he was ultimately discharged home with home health nursing care on the ninth postoperative day.    He was last seen in our office on January 14, 2017 at which time he was doing well, although several days prior to that he had experienced a transient syncopal episode that was felt to be related to blood pressure medications.  Since then he underwent routine follow-up echocardiogram on January 22, 2017 that revealed normal functioning bioprosthetic tissue valve in aortic position.  There was no aortic insufficiency and mean transvalvular gradient across the valve was estimated only 12 mmHg.  There was mild to moderate  global left ventricular systolic dysfunction with ejection fraction estimated 40-45%.  There was mild mitral regurgitation and moderate tricuspid regurgitation.  Since then the patient was seen in follow-up by Jorja Loa at [facility] on February 06, 2017.  Low-dose ACE inhibitor was resumed at that time and a 30 day Holter monitor was ordered.    The patient returns to the office today for routine follow-up.  He reports that he is doing exceptionally well.  He does not have any pain in his chest whatsoever.  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  He is limited primarily from severe pain in his right hip and he hopes to be able to have his hip replaced in the near future.  Appetite is good.  He denies any shortness of breath.  He has elected not to participate in the outpatient cardiac rehab program, primarily due to the inconvenience because of where he lives.  He also does not feel that he needs any cardiac rehab.  He has not had any further dizzy spells.  Overall he feels quite well.  Current Outpatient Medications  Medication Sig Dispense Refill  . alendronate (FOSAMAX) 70 MG tablet Take 70 mg by mouth every Thursday.     Marland Kitchen aspirin 81 MG chewable tablet Chew 1 tablet (81 mg total) by mouth daily.    Marland Kitchen atorvastatin (LIPITOR) 80 MG tablet Take 1 tablet (80 mg total) by mouth daily at 6 PM.    . bicalutamide (CASODEX) 50 MG tablet Take 50 mg by mouth daily.    . carvedilol (COREG) 6.25 MG tablet Take 1 tablet (6.25 mg  total) by mouth 2 (two) times daily with a meal. 60 tablet 1  . Cholecalciferol (VITAMIN D-3) 5000 units TABS Take 5,000 Units by mouth daily.    . clopidogrel (PLAVIX) 75 MG tablet TAKE 1 TABLET BY MOUTH DAILY 90 tablet 1  . ferrous sulfate 325 (65 FE) MG tablet Take 1 tablet (325 mg total) by mouth daily. For one month then stop. 30 tablet 3  . lisinopril (PRINIVIL,ZESTRIL) 2.5 MG tablet Take 1 tablet (2.5 mg total) by mouth daily. 90 tablet 3  .  metFORMIN (GLUCOPHAGE) 500 MG tablet Take 500 mg by mouth.    Marland Kitchen omeprazole (PRILOSEC) 20 MG capsule Take 20 mg by mouth daily.    . traMADol (ULTRAM) 50 MG tablet Take 50 mg by mouth every 4-6 hours PRN severe pain. 28 tablet 0   No current facility-administered medications for this visit.       Physical Exam:   BP 115/70   Pulse (!) 50   Resp 18   Ht 5\' 7"  (1.702 m)   Wt 160 lb (72.6 kg)   SpO2 97% Comment: RA  BMI 25.06 kg/m   General:  Well-appearing  Chest:   Clear to auscultation  CV:   Regular rate and rhythm without murmur  Incisions:  Completely healed, sternum is stable  Abdomen:  Soft nontender  Extremities:  Warm and well perfused  Diagnostic Tests:  Transthoracic Echocardiography  Patient:    Kevin Burgess, Kevin Burgess MR #:       [id number] Study Date: 01/22/2017 Gender:     M Age:        76 Height:     170.2 cm Weight:     67.1 kg BSA:        1.79 m^2 Pt. Status: Room:   ATTENDING    Darylene Price, M.D.  ORDERING     Darylene Price, M.D.  REFERRING    Darylene Price, M.D.  PERFORMING   [facility], Outpatient  SONOGRAPHER  Roseanna Rainbow  cc:  ------------------------------------------------------------------- LV EF: 40% -   45%  ------------------------------------------------------------------- Indications:      CAD of native vessels 414.01.  ------------------------------------------------------------------- History:   PMH:  Ascending aortic anerysm repair. Bentall procedure using 68mm [employer], petroleum prosthetic valve.  PMH:   Myocardial infarction.  ------------------------------------------------------------------- Study Conclusions  - Left ventricle: The cavity size was normal. Wall thickness was   increased in a pattern of mild LVH. Systolic function was mildly   to moderately reduced. The estimated ejection fraction was in the   range of 40% to 45%. Diffuse hypokinesis. Doppler parameters are   consistent with abnormal left ventricular  relaxation (grade 1   diastolic dysfunction). Doppler parameters are consistent with   high ventricular filling pressure. - Aortic valve: A bioprosthesis was present. - Mitral valve: There was mild regurgitation. - Tricuspid valve: There was moderate regurgitation.  Impressions:  - Mild to moderate global reduction in LV systolic function; mild   diastolic dysfunction with elevated LV filling pressure; s/p AVR   with normal mean gradient 12 mmHg and no AI; mild MR; moderate   TR.  ------------------------------------------------------------------- Labs, prior tests, procedures, and surgery: Coronary artery bypass grafting.  ------------------------------------------------------------------- Study data:  The previous study was not available, so comparison was made to the report of 12/06/2016.  Study status:  Routine. Procedure:  The patient reported no pain pre or post test. Transthoracic echocardiography. Image quality was poor. The study was technically difficult, as a result of poor sound wave transmission.  Study completion:  There were no complications.     Transthoracic echocardiography.  M-mode, complete 2D, spectral Doppler, and color Doppler.  Birthdate:  Patient birthdate: 09-28-1940.  Age:  Patient is 77 yr old.  Sex:  Gender: male. BMI: 23.2 kg/m^2.  Blood pressure:     149/81  Patient status: Outpatient.  Study date:  Study date: 01/22/2017. Study time: 01:58 PM.  Location:  Echo laboratory.  -------------------------------------------------------------------  ------------------------------------------------------------------- Left ventricle:  The cavity size was normal. Wall thickness was increased in a pattern of mild LVH. Systolic function was mildly to moderately reduced. The estimated ejection fraction was in the range of 40% to 45%. Diffuse hypokinesis. Doppler parameters are consistent with abnormal left ventricular relaxation (grade 1 diastolic  dysfunction). Doppler parameters are consistent with high ventricular filling pressure.  ------------------------------------------------------------------- Aortic valve:  A bioprosthesis was present. Mobility was not restricted.  Doppler:  Transvalvular velocity was within the normal range. There was no stenosis. There was no regurgitation.    VTI ratio of LVOT to aortic valve: 0.43. Indexed valve area (VTI): 0.55 cm^2/m^2. Peak velocity ratio of LVOT to aortic valve: 0.46. Indexed valve area (Vmax): 0.58 cm^2/m^2. Mean velocity ratio of LVOT to aortic valve: 0.45. Indexed valve area (Vmean): 0.57 cm^2/m^2.    Mean gradient (S): 12 mm Hg. Peak gradient (S): 22 mm Hg.  ------------------------------------------------------------------- Aorta:  Aortic root: The aortic root was normal in size.  ------------------------------------------------------------------- Mitral valve:   Structurally normal valve.   Mobility was not restricted.  Doppler:  Transvalvular velocity was within the normal range. There was no evidence for stenosis. There was mild regurgitation.    Peak gradient (D): 2 mm Hg.  ------------------------------------------------------------------- Left atrium:  The atrium was normal in size.  ------------------------------------------------------------------- Right ventricle:  The cavity size was normal. Systolic function was normal.  ------------------------------------------------------------------- Pulmonic valve:    Doppler:  Transvalvular velocity was within the normal range. There was no evidence for stenosis. There was trivial regurgitation.  ------------------------------------------------------------------- Tricuspid valve:   Structurally normal valve.    Doppler: Transvalvular velocity was within the normal range. There was moderate regurgitation.  ------------------------------------------------------------------- Pulmonary artery:   Systolic  pressure was within the normal range.   ------------------------------------------------------------------- Right atrium:  The atrium was normal in size.  ------------------------------------------------------------------- Pericardium:  There was no pericardial effusion.  ------------------------------------------------------------------- Systemic veins: Inferior vena cava: The vessel was normal in size.  ------------------------------------------------------------------- Measurements   Left ventricle                           Value          Reference  LV ID, ED, PLAX chordal           (L)    40.3  mm       43 - 52  LV ID, ES, PLAX chordal                  34.6  mm       23 - 38  LV fx shortening, PLAX chordal    (L)    14    %        >=29  LV PW thickness, ED                      13.36 mm       ----------  IVS/LV PW ratio, ED  0.97           <=1.3  Stroke volume, 2D                        40    ml       ----------  Stroke volume/bsa, 2D                    22    ml/m^2   ----------  LV ejection fraction, 1-p A4C            48    %        ----------  LV end-diastolic volume, 2-p             69    ml       ----------  LV end-systolic volume, 2-p              49    ml       ----------  LV ejection fraction, 2-p                30    %        ----------  Stroke volume, 2-p                       21    ml       ----------  LV end-diastolic volume/bsa, 2-p         39    ml/m^2   ----------  LV end-systolic volume/bsa, 2-p          27    ml/m^2   ----------  Stroke volume/bsa, 2-p                   11.5  ml/m^2   ----------  LV e&', lateral                           6.66  cm/s     ----------  LV E/e&', lateral                         11.65          ----------  LV e&', medial                            3.16  cm/s     ----------  LV E/e&', medial                          24.56          ----------  LV e&', average                           4.91  cm/s     ----------  LV  E/e&', average                         15.8           ----------  Longitudinal strain, TDI                 14    %        ----------    Ventricular septum  Value          Reference  IVS thickness, ED                        12.9  mm       ----------    LVOT                                     Value          Reference  LVOT ID, S                               17    mm       ----------  LVOT area                                2.27  cm^2     ----------  LVOT peak velocity, S                    106   cm/s     ----------  LVOT mean velocity, S                    71.1  cm/s     ----------  LVOT VTI, S                              17.8  cm       ----------  LVOT peak gradient, S                    4     mm Hg    ----------    Aortic valve                             Value          Reference  Aortic valve peak velocity, S            232   cm/s     ----------  Aortic valve mean velocity, S            158   cm/s     ----------  Aortic valve VTI, S                      41.1  cm       ----------  Aortic mean gradient, S                  12    mm Hg    ----------  Aortic peak gradient, S                  22    mm Hg    ----------  VTI ratio, LVOT/AV                       0.43           ----------  Aortic valve area/bsa, VTI               0.55  cm^2/m^2 ----------  Velocity ratio, peak, LVOT/AV  0.46           ----------  Aortic valve area/bsa, peak              0.58  cm^2/m^2 ----------  velocity  Velocity ratio, mean, LVOT/AV            0.45           ----------  Aortic valve area/bsa, mean              0.57  cm^2/m^2 ----------  velocity    Aorta                                    Value          Reference  Aortic root ID, ED                       28    mm       ----------  Ascending aorta ID, A-P, S               31    mm       ----------    Left atrium                              Value          Reference  LA ID, A-P, ES                           34    mm        ----------  LA ID/bsa, A-P                           1.9   cm/m^2   <=2.2  LA volume, S                             41.7  ml       ----------  LA volume/bsa, S                         23.3  ml/m^2   ----------  LA volume, ES, 1-p A4C                   49.9  ml       ----------  LA volume/bsa, ES, 1-p A4C               27.9  ml/m^2   ----------  LA volume, ES, 1-p A2C                   31.3  ml       ----------  LA volume/bsa, ES, 1-p A2C               17.5  ml/m^2   ----------    Mitral valve                             Value          Reference  Mitral E-wave peak velocity              77.6  cm/s     ----------  Mitral A-wave peak velocity              95.5  cm/s     ----------  Mitral deceleration time          (H)    232   ms       150 - 230  Mitral peak gradient, D                  2     mm Hg    ----------  Mitral E/A ratio, peak                   0.8            ----------    Pulmonary arteries                       Value          Reference  PA pressure, S, DP                       26    mm Hg    <=30    Tricuspid valve                          Value          Reference  Tricuspid regurg peak velocity           238   cm/s     ----------  Tricuspid peak RV-RA gradient            23    mm Hg    ----------    Right atrium                             Value          Reference  RA ID, S-I, ES, A4C               (H)    51    mm       34 - 49  RA area, ES, A4C                         18.3  cm^2     8.3 - 19.5  RA volume, ES, A/L                       54.6  ml       ----------  RA volume/bsa, ES, A/L                   30.6  ml/m^2   ----------    Systemic veins                           Value          Reference  Estimated CVP                            3     mm Hg    ----------    Right ventricle                          Value  Reference  TAPSE                                    6.45  mm       ----------  RV pressure, S, DP                       26    mm Hg    <=30  RV s&',  lateral, S                        4.95  cm/s     ----------  Legend: (L)  and  (H)  mark values outside specified reference range.  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  Plan:  I have encouraged the patient to continue to gradually increase his physical activity without any particular limitations at this time.  As long as his 30-day Holter monitor test looks good I think he may resume driving an automobile.  We have not recommended any changes to the patient's current medications.  It probably would be reasonable to stop Plavix after another 3 months.  We will defer any long-term changes to his medications to the discretion of Dr. Saunders Revel.  The patient has been reminded regarding the importance of dental hygiene and the lifelong need for antibiotic prophylaxis for all dental cleanings and other related invasive procedures.  The patient will return to our office for routine follow-up next fall, approximately 1 year following his original surgery.  During the interim he will call and return to see [place] only should specific problems or questions arise.  I spent in excess of 15 minutes during the conduct of this office consultation and >50% of this time involved direct face-to-face encounter with the patient for counseling and/or coordination of their care.   Valentina Gu. Roxy Manns, MD 03/18/2017 1:45 PM

## 2017-04-03 ENCOUNTER — Other Ambulatory Visit: Payer: Self-pay | Admitting: *Deleted

## 2017-04-03 MED ORDER — CARVEDILOL 3.125 MG PO TABS: 3.1250 mg | ORAL_TABLET | Freq: Two times a day (BID) | ORAL | 3 refills | Status: DC

## 2017-04-08 NOTE — Progress Notes (Signed)
Follow-up Outpatient Visit Date: 04/10/2017  Primary Care Provider: [name], MD [address] [id number]  Chief Complaint: Follow-up coronary artery disease, cardiomyopathy, and valvular heart disease  HPI:  [name] is a 77 y.o. 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 He was last seen in our office by Burtis Junes, NP, in 01/2017. He was seen in the emergency department shortly before that visit due to a syncopal episode while working with physical therapy. Otherwise, he had been gradually recovering from his cardiac surgery. He was doing very well at follow-up with Dr. Roxy Manns earlier this month.  Today, [name] reports feeling very well. In fact, he states that he feels the best that he has felt in years. His only concern is for right hip pain. He is contemplating right hip arthroplasty this spring. Despite this, he remains active, walking on a regular basis. He denies chest pain, shortness of breath, palpitations, lightheadedness, and edema. He is tolerating his current medications well. He has not had any further syncopal spells.  --------------------------------------------------------------------------------------------------  Past Medical History:  Diagnosis Date  . Aortic insufficiency    a. 10/2016 Echo: Mod AI w/ Ao root dil-->s/p Bentall and AVR w/ [facility] Ease pericardial tissue valve (30mm, model # 3300TFX, ser # [id number]).  Marland Kitchen CAD (coronary artery disease)    a. 11/2016 NSTEMI/Cath: LM nl, LAD 80ost, 70p, 48m, D1 50ost, D2 70, RI nl, LCX 80ost, RCA 95ost/p, 36m, RPDA 90ost, EF 35-45%;  b. 11/2016 CABG x 2: LIMA->LAD, VG->RPDA (@ time of Bentall/AVR).  . Carotid artery stenosis, asymptomatic, right   . History of hiatal hernia   . Ischemic cardiomyopathy    a. 11/2016 Echo: EF 40%, diff HK, mild LVH, Gr2 DD, mod AI, 71mm Asc Ao, mild MR, mildly dil LA; b. 01/2017 Echo: EF 40-45%, mild LVH, diff HK, Gr1 DD, Ao bioprosthesis, mild MR, mod TR.  Marland Kitchen Left carotid artery occlusion   . OA (osteoarthritis)    a. Pending R Hip THA  . Prostate cancer ([place])    a. s/p TURP ~ 1999.  . S/P ascending aortic aneurysm repair 12/07/2016   Hemashield straight graft  . S/P Bentall root replacement with bioprosthetic valve 12/07/2016   21 mm [facility] Ease and 24 mm Gelweave Valsalva aortic root graft with reimplantation of left main coronary artery  . 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 Stenosis of right subclavian artery ([place])   . Thoracic ascending aortic aneurysm [facility]    Past Surgical History:  Procedure Laterality Date  . BENTALL PROCEDURE N/A 12/07/2016   Procedure: BIOLOGICAL BENTALL AORTIC ROOT REPLACEMENT;  Surgeon: Rexene Alberts, MD;  Location: [place];  Service: Open Heart Surgery;  Laterality: N/A;  . CORONARY ARTERY BYPASS GRAFT N/A 12/07/2016   Procedure: CORONARY ARTERY BYPASS GRAFTING (CABG) X 2;  Surgeon: Rexene Alberts, MD;  Location: [place];  Service: Open Heart Surgery;  Laterality: N/A;  . ENDOVEIN HARVEST OF GREATER SAPHENOUS VEIN Left 12/07/2016   Procedure: ENDOVEIN HARVEST OF GREATER SAPHENOUS VEIN;  Surgeon: Rexene Alberts, MD;  Location: [place];  Service: Open Heart  Surgery;  Laterality: Left;  . LEFT HEART CATH AND CORONARY ANGIOGRAPHY N/A 12/04/2016   Procedure: LEFT HEART CATH AND CORONARY ANGIOGRAPHY;  Surgeon: Nelva Bush, MD;  Location: [place] CV LAB;  Service: Cardiovascular;  Laterality: N/A;  . TEE WITHOUT CARDIOVERSION N/A 12/07/2016   Procedure: TRANSESOPHAGEAL ECHOCARDIOGRAM (TEE);  Surgeon: Rexene Alberts, MD;  Location: [place];  Service: Open Heart Surgery;  Laterality: N/A;  . THORACIC AORTIC ANEURYSM REPAIR N/A 12/07/2016   Procedure: THORACIC ASCENDING ANEURYSM REPAIR (AAA);  Surgeon: Rexene Alberts, MD;  Location: [place];  Service: Open Heart Surgery;  Laterality: N/A;  . TRANSURETHRAL RESECTION OF PROSTATE     a. ~ 1999    Current Meds  Medication Sig  . alendronate (FOSAMAX) 70 MG tablet Take 70 mg by mouth every Thursday.   Marland Kitchen aspirin 81 MG chewable tablet Chew 1 tablet (81 mg total) by mouth daily.  Marland Kitchen atorvastatin (LIPITOR) 80 MG tablet Take 1 tablet (80 mg total) by mouth daily at 6 PM.  . bicalutamide (CASODEX) 50 MG tablet Take 50 mg by mouth daily.  . carvedilol (COREG) 3.125 MG tablet Take 1 tablet (3.125 mg total) by mouth 2 (two) times daily.  . Cholecalciferol (VITAMIN D-3) 5000 units TABS Take 5,000 Units by mouth daily.  . clopidogrel (PLAVIX) 75 MG tablet TAKE 1 TABLET BY MOUTH DAILY  . lisinopril (PRINIVIL,ZESTRIL) 2.5 MG tablet Take 1 tablet (2.5 mg total) by mouth daily.  Marland Kitchen omeprazole (PRILOSEC) 20 MG capsule Take 20 mg by mouth daily.  . traMADol (ULTRAM) 50 MG tablet Take 50 mg by mouth every 4-6 hours PRN severe pain.    Allergies: Patient has no known allergies.  Social History   Socioeconomic History  . Marital status: Married    Spouse name: Not on file  . Number of children: Not on file  . Years of education: Not on file  . Highest education level: Not on file  Social Needs  . Financial resource strain: Not on file  . Food insecurity - worry: Not on file  . Food insecurity - inability: Not on file  . Transportation needs - medical: Not on file  . Transportation needs - non-medical: Not on file  Occupational History  . Occupation: Advertising account planner  . Occupation: worked in Barrister's clerk for many years  Tobacco Use  . Smoking status: Never Smoker  . Smokeless tobacco: Never Used  Substance and Sexual Activity  . Alcohol use: No  . Drug use: No   . Sexual activity: Not on file  Other Topics Concern  . Not on file  Social History Narrative   [place]  Raises chickens.    History reviewed. No pertinent family history.  Review of Systems: A 12-system review of systems was performed and was negative except as noted in the HPI.  --------------------------------------------------------------------------------------------------  Physical Exam: BP 138/70 (BP Location: Left Arm, Patient Position: Sitting, Cuff Size: Normal)   Pulse 64   Ht 5\' 7"  (1.702 m)   Wt 153 lb (69.4 kg)   BMI 23.96 kg/m   General:  Well-developed, well-nourished man, seated comfortably int he exam room. HEENT: No conjunctival pallor or scleral icterus. Moist mucous membranes.  OP clear. Neck: Supple without lymphadenopathy, thyromegaly, JVD, or HJR. No carotid bruit. Lungs: Normal work of breathing. Clear to auscultation bilaterally without wheezes or crackles. Heart: RRR with 3/6 crescendo-decrescendo systolic murmur loudest at LUSB. Crisp S1 and S2. No  rubs or gallops. Non-displaced PMI. Abd: Bowel sounds present. Soft, NT/ND without hepatosplenomegaly Ext: Trace pretibial edema bilaterally. 2+ radial pulses bilaterally. Skin: Warm and dry without rash. Well-healed median sternotomy incision.  EKG:  NSR with poor R-wave progression, inferior Q-waves and anterolateral T-wave inversions. R-wave progression in V1-V2 has decreased, which may reflect lead placement. Otherwise, there has been no significant change since 02/06/17.  Lab Results  Component Value Date   WBC 9.6 01/11/2017   HGB 9.5 (L) 01/11/2017   HCT 30.1 (L) 01/11/2017   MCV 88.5 01/11/2017   PLT 311 01/11/2017    Lab Results  Component Value Date   NA 136 01/11/2017   K 4.4 01/11/2017   CL 104 01/11/2017   CO2 23 01/11/2017   BUN 16 01/11/2017   CREATININE 1.03 01/11/2017   GLUCOSE 95 01/11/2017   ALT 34 01/11/2017    Lab Results    Component Value Date   CHOL 256 (H) 12/05/2016   HDL 47 12/05/2016   LDLCALC 188 (H) 12/05/2016   TRIG 106 12/05/2016   CHOLHDL 5.4 12/05/2016    --------------------------------------------------------------------------------------------------  ASSESSMENT AND PLAN: Coronary artery disease without angina No symptoms to suggest worsening coronary insufficiency status post CABG in 11/2016. Continue with DAPT for at least 12 months from time of NSTEMI in 11/2016, as well as secondary prevention with carvedilol and atorvastatin. I encouraged Mr. Autry to remain active.  Ischemic cardiomyopathy Mild leg edema noted. Otherwise, Mr. Ambers appears euvolemic on exam with NYHA class I-II symptoms. We will continue current doses of carvedilol and lisinopril, as I am hesitant to drop his heart rate and blood pressure given syncopal episode this fall. Fortunately, he has not had recurrence of syncope. I recommended that he refrain from driving until it has been 6 months since his syncopal episode.  Thoracic aortic aneurysm status post repair and aortic valve replacement Mr. Lariccia has recovered well from his surgery. No further intervention at this time. Importance of antibiotic prophylaxis prior to dental procedures were discussed with the patient and his family.  Hypertension BP upper normal today. Continue current doses of carvedilol and lisinopril.  Hyperlipidemia Goal LDL < 70; LDL was 188 at time of NSTEMI in 11/2016. We will recheck a lipid panel today. Continue atorvastatin 80 mg daily for now.  Anemia Most recent hemoglobin in 01/2017 was still low at 9.5. We will recheck a CBC today to ensure that it is responding appropriately.  Follow-up: Return to clinic in 3-4 months.  Nelva Bush, MD 04/10/2017 12:52 PM

## 2017-04-10 ENCOUNTER — Encounter: Payer: Self-pay | Admitting: Internal Medicine

## 2017-04-10 ENCOUNTER — Ambulatory Visit (INDEPENDENT_AMBULATORY_CARE_PROVIDER_SITE_OTHER): Payer: Medicare Other | Admitting: Internal Medicine

## 2017-04-10 VITALS — BP 138/70 | HR 64 | Ht 67.0 in | Wt 153.0 lb

## 2017-04-10 DIAGNOSIS — Z952 Presence of prosthetic heart valve: Secondary | ICD-10-CM | POA: Diagnosis not present

## 2017-04-10 DIAGNOSIS — E785 Hyperlipidemia, unspecified: Secondary | ICD-10-CM

## 2017-04-10 DIAGNOSIS — I712 Thoracic aortic aneurysm, without rupture, unspecified: Secondary | ICD-10-CM

## 2017-04-10 DIAGNOSIS — D649 Anemia, unspecified: Secondary | ICD-10-CM

## 2017-04-10 DIAGNOSIS — I255 Ischemic cardiomyopathy: Secondary | ICD-10-CM | POA: Diagnosis not present

## 2017-04-10 DIAGNOSIS — I10 Essential (primary) hypertension: Secondary | ICD-10-CM

## 2017-04-10 DIAGNOSIS — I214 Non-ST elevation (NSTEMI) myocardial infarction: Secondary | ICD-10-CM | POA: Diagnosis not present

## 2017-04-10 DIAGNOSIS — I2510 Atherosclerotic heart disease of native coronary artery without angina pectoris: Secondary | ICD-10-CM

## 2017-04-10 NOTE — Patient Instructions (Signed)
Medication Instructions:  Your physician recommends that you continue on your current medications as directed. Please refer to the Current Medication list given to you today.   Labwork: Your physician recommends that you return for lab work in: TODAY (CBC, LIPID, DIRECT LDL).   Testing/Procedures: none  Follow-Up: Your physician recommends that you schedule a follow-up appointment in: 3-4 MONTHS WITH DR END.    If you need a refill on your cardiac medications before your next appointment, please call your pharmacy.

## 2017-04-11 LAB — LIPID PANEL
CHOLESTEROL TOTAL: 135 mg/dL (ref 100–199)
Chol/HDL Ratio: 2.6 ratio (ref 0.0–5.0)
HDL: 51 mg/dL (ref >39)
LDL CALC: 67 mg/dL (ref 0–99)
TRIGLYCERIDES: 83 mg/dL (ref 0–149)
VLDL CHOLESTEROL CAL: 17 mg/dL (ref 5–40)

## 2017-04-11 LAB — CBC
HEMOGLOBIN: 10.7 g/dL — AB (ref 13.0–17.7)
Hematocrit: 32.8 % — ABNORMAL LOW (ref 37.5–51.0)
MCH: 28.5 pg (ref 26.6–33.0)
MCHC: 32.6 g/dL (ref 31.5–35.7)
MCV: 88 fL (ref 79–97)
Platelets: 277 10*3/uL (ref 150–379)
RBC: 3.75 x10E6/uL — AB (ref 4.14–5.80)
RDW: 15.9 % — ABNORMAL HIGH (ref 12.3–15.4)
WBC: 6.9 10*3/uL (ref 3.4–10.8)

## 2017-04-11 LAB — LDL CHOLESTEROL, DIRECT: LDL DIRECT: 80 mg/dL (ref 0–99)

## 2017-06-03 DIAGNOSIS — E1165 Type 2 diabetes mellitus with hyperglycemia: Secondary | ICD-10-CM | POA: Insufficient documentation

## 2017-06-03 DIAGNOSIS — E782 Mixed hyperlipidemia: Secondary | ICD-10-CM | POA: Insufficient documentation

## 2017-06-13 DIAGNOSIS — M1611 Unilateral primary osteoarthritis, right hip: Secondary | ICD-10-CM | POA: Insufficient documentation

## 2017-07-03 ENCOUNTER — Encounter: Payer: Self-pay | Admitting: *Deleted

## 2017-07-03 ENCOUNTER — Encounter: Payer: Self-pay | Admitting: Internal Medicine

## 2017-07-03 ENCOUNTER — Ambulatory Visit (INDEPENDENT_AMBULATORY_CARE_PROVIDER_SITE_OTHER): Payer: Medicare Other | Admitting: Cardiovascular Disease

## 2017-07-03 VITALS — BP 150/68 | HR 60 | Ht 67.0 in | Wt 155.8 lb

## 2017-07-03 DIAGNOSIS — R55 Syncope and collapse: Secondary | ICD-10-CM

## 2017-07-03 DIAGNOSIS — Z951 Presence of aortocoronary bypass graft: Secondary | ICD-10-CM

## 2017-07-03 DIAGNOSIS — Z952 Presence of prosthetic heart valve: Secondary | ICD-10-CM

## 2017-07-03 DIAGNOSIS — I712 Thoracic aortic aneurysm, without rupture, unspecified: Secondary | ICD-10-CM

## 2017-07-03 DIAGNOSIS — Z01810 Encounter for preprocedural cardiovascular examination: Secondary | ICD-10-CM | POA: Insufficient documentation

## 2017-07-03 DIAGNOSIS — I25118 Atherosclerotic heart disease of native coronary artery with other forms of angina pectoris: Secondary | ICD-10-CM

## 2017-07-03 DIAGNOSIS — I10 Essential (primary) hypertension: Secondary | ICD-10-CM | POA: Insufficient documentation

## 2017-07-03 DIAGNOSIS — I351 Nonrheumatic aortic (valve) insufficiency: Secondary | ICD-10-CM

## 2017-07-03 NOTE — Patient Instructions (Addendum)
Please call with blood pressure numbers  In the next two weeks If blood pressure runs high, we could increase the lisinopril  Tell the surgeons to try to minimize fluids periop/post op  Medication Instructions:   No medication changes made  Ok to stop plavix 7  Days before the surgery Stay on asa 81 daily  Antibiotics 2 hours prior to the surgery Amoxicillin 2,000 mg x1 2 hours prior to the procedure  Labwork:  No new labs needed  Testing/Procedures:  No further testing at this time   Follow-Up: It was a pleasure seeing you in the office today. Please call us if you have new issues that need to be addressed before your next appt.  [phone]  Your physician wants you to follow-up in: 6 months with Dr. Saunders Revel You will receive a reminder letter in the mail two months in advance. If you don't receive a letter, please call our office to schedule the follow-up appointment.  If you need a refill on your cardiac medications before your next appointment, please call your pharmacy.  For educational health videos Log in to : [email or web address] Or : [email or web address], password : [email or web address]

## 2017-07-03 NOTE — Progress Notes (Signed)
Follow-up Outpatient Visit Date: 07/03/2017  Primary Care Provider: [name], MD [place] [id number]  Chief Complaint  Patient presents with  . other    3-4 month follow up. Cardiac clearance for right hip replacement; Dr. Myriam Jacobson with [facility]. "doing well."      HPI:  Kevin Burgess is a 77 y.o. 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   In follow-up today he reports that he is doing well,  Very active at baseline No chest pain or SOB  Washes the car, mows, "does everything he wants" Good ADLs Family who presents with him today reports he is on the move all the time  Bad hip pain, limiting his mobility reports having more pain when he sits for prolonged period of time.   Previous echocardiogram November 2018 Echo 40 - 45%  Scheduled for total hip replacement on the right May 7th [place], Dr. Myriam Jacobson  EKG personally reviewed by myself on todays visit Normal sinus rhythm with APCs, T wave abnormality anterolateral leads,  no change from prior EKGs  --------------------------------------------------------------------------------------------------  Past Medical History:  Diagnosis Date  . Aortic insufficiency    a. 10/2016 Echo: Mod AI w/ Ao root dil-->s/p Bentall and AVR w/ [facility] Ease pericardial tissue valve (7mm, model # 3300TFX, ser # [id number]).  Marland Kitchen CAD (coronary artery disease)    a. 11/2016 NSTEMI/Cath: LM nl, LAD 80ost, 70p, 3m, D1 50ost, D2 70, RI nl, LCX 80ost, RCA 95ost/p, 70m, RPDA 90ost, EF 35-45%; b. 11/2016 CABG x 2: LIMA->LAD, VG->RPDA (@ time of Bentall/AVR).    . Carotid artery stenosis, asymptomatic, right   . History of hiatal hernia   . Ischemic cardiomyopathy    a. 11/2016 Echo: EF 40%, diff HK, mild LVH, Gr2 DD, mod AI, 89mm Asc Ao, mild MR, mildly dil LA; b. 01/2017 Echo: EF 40-45%, mild LVH, diff HK, Gr1 DD, Ao bioprosthesis, mild MR, mod TR.  Marland Kitchen Left carotid artery occlusion   . OA (osteoarthritis)    a. Pending R Hip THA  . Prostate cancer ([place])    a. s/p TURP ~ 1999.  . S/P ascending aortic aneurysm repair 12/07/2016   Hemashield straight graft  . S/P Bentall root replacement with bioprosthetic valve 12/07/2016   21 mm [facility] Ease and 24 mm Gelweave Valsalva aortic root graft with reimplantation of left main coronary artery  . 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 Stenosis of right subclavian artery ([place])   . Thoracic ascending aortic aneurysm [facility])    Past Surgical History:  Procedure Laterality Date  . BENTALL PROCEDURE N/A 12/07/2016   Procedure: BIOLOGICAL BENTALL AORTIC ROOT REPLACEMENT;  Surgeon: Rexene Alberts, MD;  Location: [place];  Service: Open Heart Surgery;  Laterality: N/A;  . CORONARY ARTERY BYPASS GRAFT N/A 12/07/2016   Procedure: CORONARY ARTERY BYPASS GRAFTING (CABG) X 2;  Surgeon: Rexene Alberts, MD;  Location: [place];  Service: Open Heart Surgery;  Laterality: N/A;  . ENDOVEIN HARVEST OF GREATER SAPHENOUS VEIN Left 12/07/2016   Procedure: ENDOVEIN HARVEST OF GREATER SAPHENOUS VEIN;  Surgeon: Darylene Price  H, MD;  Location: [place];  Service: Open Heart Surgery;  Laterality: Left;  . LEFT HEART CATH AND CORONARY ANGIOGRAPHY N/A 12/04/2016   Procedure: LEFT HEART CATH AND CORONARY ANGIOGRAPHY;  Surgeon: Nelva Bush, MD;  Location: [place] CV LAB;  Service: Cardiovascular;  Laterality: N/A;  . TEE WITHOUT CARDIOVERSION N/A 12/07/2016   Procedure: TRANSESOPHAGEAL ECHOCARDIOGRAM (TEE);  Surgeon: Rexene Alberts, MD;  Location: [place];  Service: Open Heart Surgery;   Laterality: N/A;  . THORACIC AORTIC ANEURYSM REPAIR N/A 12/07/2016   Procedure: THORACIC ASCENDING ANEURYSM REPAIR (AAA);  Surgeon: Rexene Alberts, MD;  Location: [place];  Service: Open Heart Surgery;  Laterality: N/A;  . TRANSURETHRAL RESECTION OF PROSTATE     a. ~ 1999   No outpatient medications have been marked as taking for the 07/03/17 encounter (Appointment) with Dason Mosley, [place] Gave, MD.    Current Outpatient Medications on File Prior to Visit  Medication Sig Dispense Refill  . alendronate (FOSAMAX) 70 MG tablet Take 70 mg by mouth every Thursday.     Marland Kitchen aspirin 81 MG chewable tablet Chew 1 tablet (81 mg total) by mouth daily.    Marland Kitchen atorvastatin (LIPITOR) 80 MG tablet Take 1 tablet (80 mg total) by mouth daily at 6 PM.    . bicalutamide (CASODEX) 50 MG tablet Take 50 mg by mouth daily.    . carvedilol (COREG) 3.125 MG tablet Take 1 tablet (3.125 mg total) by mouth 2 (two) times daily. 180 tablet 3  . Cholecalciferol (VITAMIN D-3) 5000 units TABS Take 5,000 Units by mouth daily.    . clopidogrel (PLAVIX) 75 MG tablet TAKE 1 TABLET BY MOUTH DAILY 90 tablet 1  . lisinopril (PRINIVIL,ZESTRIL) 2.5 MG tablet Take 1 tablet (2.5 mg total) by mouth daily. 90 tablet 3  . omeprazole (PRILOSEC) 20 MG capsule Take 20 mg by mouth daily.    . traMADol (ULTRAM) 50 MG tablet Take 50 mg by mouth every 4-6 hours PRN severe pain. 28 tablet 0   No current facility-administered medications on file prior to visit.     Allergies: Patient has no known allergies.  Social History   Tobacco Use  . Smoking status: Never Smoker  . Smokeless tobacco: Never Used  Substance Use Topics  . Alcohol use: No  . Drug use: No    No family history on file.  Review of Systems:  Review of Systems  Constitutional: Negative.   Respiratory: Negative.   Cardiovascular: Negative.   Gastrointestinal: Negative.   Musculoskeletal: Positive for joint pain.  Neurological: Negative.   Psychiatric/Behavioral: Negative.     All other systems reviewed and are negative.    --------------------------------------------------------------------------------------------------  Physical Exam: BP (!) 150/68 (BP Location: Left Arm, Patient Position: Sitting, Cuff Size: Normal)   Pulse 60   Ht 5\' 7"  (1.702 m)   Wt 155 lb 12 oz (70.6 kg)   BMI 24.39 kg/m   General: Appears well Constitutional:  oriented to person, place, and time. No distress.  HENT:  Head: Normocephalic and atraumatic.  Eyes:  no discharge. No scleral icterus.  Neck: Normal range of motion. Neck supple. No JVD present.  Cardiovascular: Normal rate, regular rhythm, normal heart sounds and intact distal pulses. Exam reveals no gallop and no friction rub.  Trace edema above the sock line bilaterally  No murmur heard. Pulmonary/Chest: Effort normal and breath sounds normal. No stridor. No respiratory distress.  no wheezes.  no rales.  no tenderness.  Abdominal: Soft.  no distension.  no tenderness.  Musculoskeletal: Normal range of motion.  no  tenderness or deformity.  Neurological:  normal muscle tone. Coordination normal. No atrophy Skin: Skin is warm and dry. No rash noted. not diaphoretic.  Psychiatric:  normal mood and affect. behavior is normal. Thought content normal.    Lab Results  Component Value Date   WBC 6.9 04/10/2017   HGB 10.7 (L) 04/10/2017   HCT 32.8 (L) 04/10/2017   MCV 88 04/10/2017   PLT 277 04/10/2017    Lab Results  Component Value Date   NA 136 01/11/2017   K 4.4 01/11/2017   CL 104 01/11/2017   CO2 23 01/11/2017   BUN 16 01/11/2017   CREATININE 1.03 01/11/2017   GLUCOSE 95 01/11/2017   ALT 34 01/11/2017    Lab Results  Component Value Date   CHOL 135 04/10/2017   HDL 51 04/10/2017   LDLCALC 67 04/10/2017   LDLDIRECT 80 04/10/2017   TRIG 83 04/10/2017   CHOLHDL 2.6 04/10/2017    --------------------------------------------------------------------------------------------------  ASSESSMENT AND  PLAN:  Coronary artery disease without angina  status post CABG in 11/2016. Ideally we would like to continue with DAPT for at least 12 months from time of NSTEMI in 11/2016,  Agree with holding Plavix 7 days for right total hip replacement We will restart Plavix after surgery Will stay on aspirin without any hold Continue carvedilol and atorvastatin.  Active at baseline without anginal symptom  Preop cardiovascular Acceptable risk for surgery No further testing needed Recommended we closely monitor blood pressure in the next week or so and make changes to lisinopril if numbers continue to run high Amoxicillin 2 g 2 hours prior to surgery Recommended trying to minimize IV fluids periop- postop Will hold Plavix 7 days prior to surgery (typically 5 days, 7 days has been requested by surgical team per the family)  ischemic cardiomyopathy Prior echocardiogram with mildly elevated right heart pressures Trace edema above the sock line Made family aware to monitor leg swelling and for any shortness of breath symptoms  continue current doses of carvedilol and lisinopril,  Will likely need to increase the lisinopril for hypertension but will wait for numbers from home in the next week -We will need to be careful about overmedicating given prior history of syncope  Thoracic aortic aneurysm status post repair and aortic valve replacement  recovered well from his surgery.  No further imaging or testing at this time  Hypertension  carvedilol and lisinopril. Suspect he will need lisinopril 10 or 20 but will wait for home blood pressure measurements  Hyperlipidemia Goal LDL < 70; atorvastatin 80 mg daily for now. Total cholesterol 135  Anemia  hemoglobin in 01/2017 was still low at 9.5.  Improved up to 10.7 2 months ago  [place] He will follow-up with Dr. Saunders Revel in 6 months time   Total encounter time more than 25 minutes  Greater than 50% was spent in counseling and coordination of care  with the patient  Signed, Esmond Plants, MD, Ph.D [facility] 07/03/17

## 2017-07-08 NOTE — Addendum Note (Signed)
Addended by: Anselm Pancoast on: 07/08/2017 01:07 PM   Modules accepted: Orders

## 2017-12-16 ENCOUNTER — Encounter: Payer: Medicare Other | Admitting: Thoracic Surgery (Cardiothoracic Vascular Surgery)

## 2018-06-06 ENCOUNTER — Telehealth: Payer: Self-pay | Admitting: *Deleted

## 2018-06-06 NOTE — Telephone Encounter (Signed)
Left message to contact office to schedule future OD 6 month f/u appointment.

## 2019-04-07 ENCOUNTER — Telehealth: Payer: Self-pay | Admitting: Internal Medicine

## 2019-04-07 NOTE — Telephone Encounter (Signed)
Patient states he is not coming back has another doctor   Deleting recall

## 2019-06-19 IMAGING — DX DG CHEST 1V PORT
1 series · 1 of 1 positions shown · non-contrast
Comparison: 12/06/2016.

CLINICAL DATA: Status post CABG.

EXAM:
PORTABLE CHEST 1 VIEW

[chest ap]
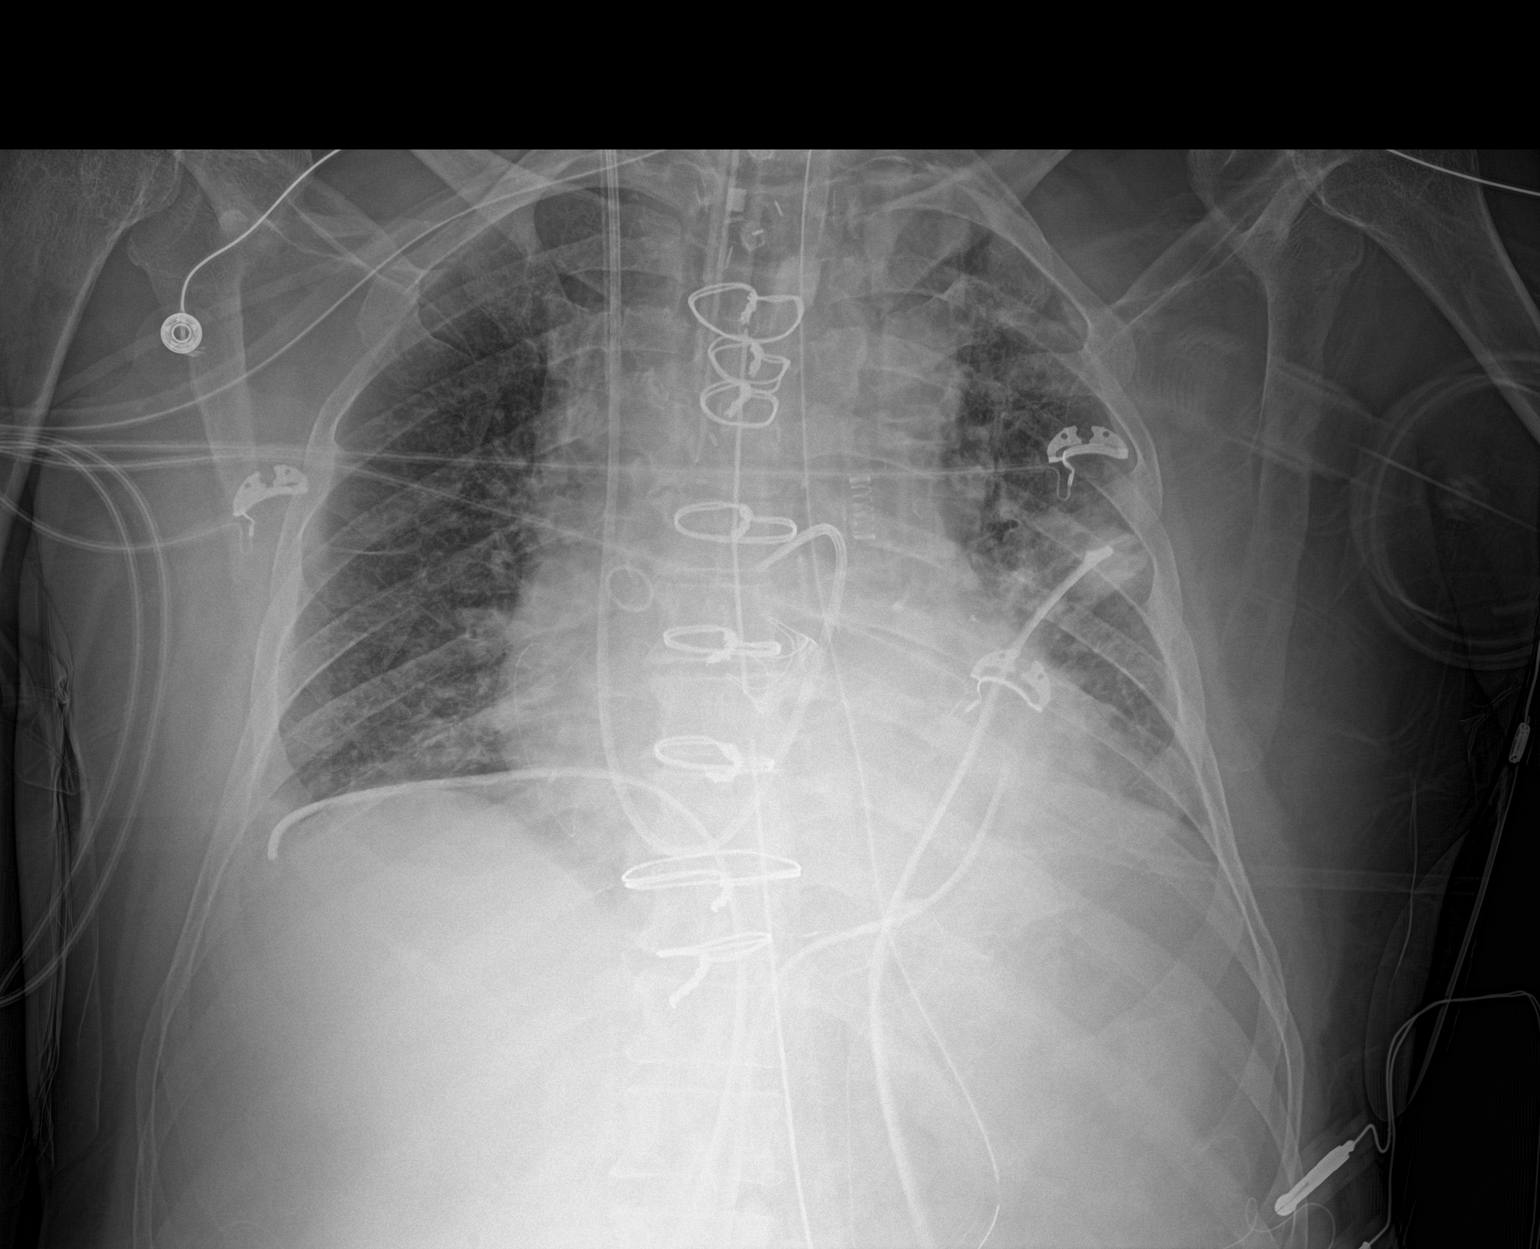

[1 of 1 positions shown; findings below may reference images not displayed]

FINDINGS: Interval post CABG changes. Endotracheal tube tip 1.2 cm above the
carina. Right jugular Swan-Ganz catheter tip in the proximal right
main pulmonary artery. Nasogastric tube extending into the stomach.
Mediastinal and bilateral chest tubes without pneumothorax. Stable
enlarged cardiac silhouette and prominent interstitial markings.
Interval patchy opacity at the left lung base and minimal right
basilar atelectasis. Thoracic spine degenerative changes.
IMPRESSION: 1. Endotracheal tube tip 1.2 cm above the carina. It is recommended
that this be retracted 5 cm.
2. Interval probable patchy atelectasis at the left lung base.
Aspiration pneumonitis could also have this appearance.
3. Minimal right basilar atelectasis.
4. Stable cardiomegaly and probable interstitial pulmonary edema
superimposed on chronic interstitial lung disease.

## 2019-06-21 IMAGING — CR DG CHEST 1V PORT
1 series · 1 of 1 positions shown · non-contrast
Comparison: 12/08/2016 and prior exams

CLINICAL DATA: CABG and aortic valve replacement.

EXAM:
PORTABLE CHEST 1 VIEW

[AP]
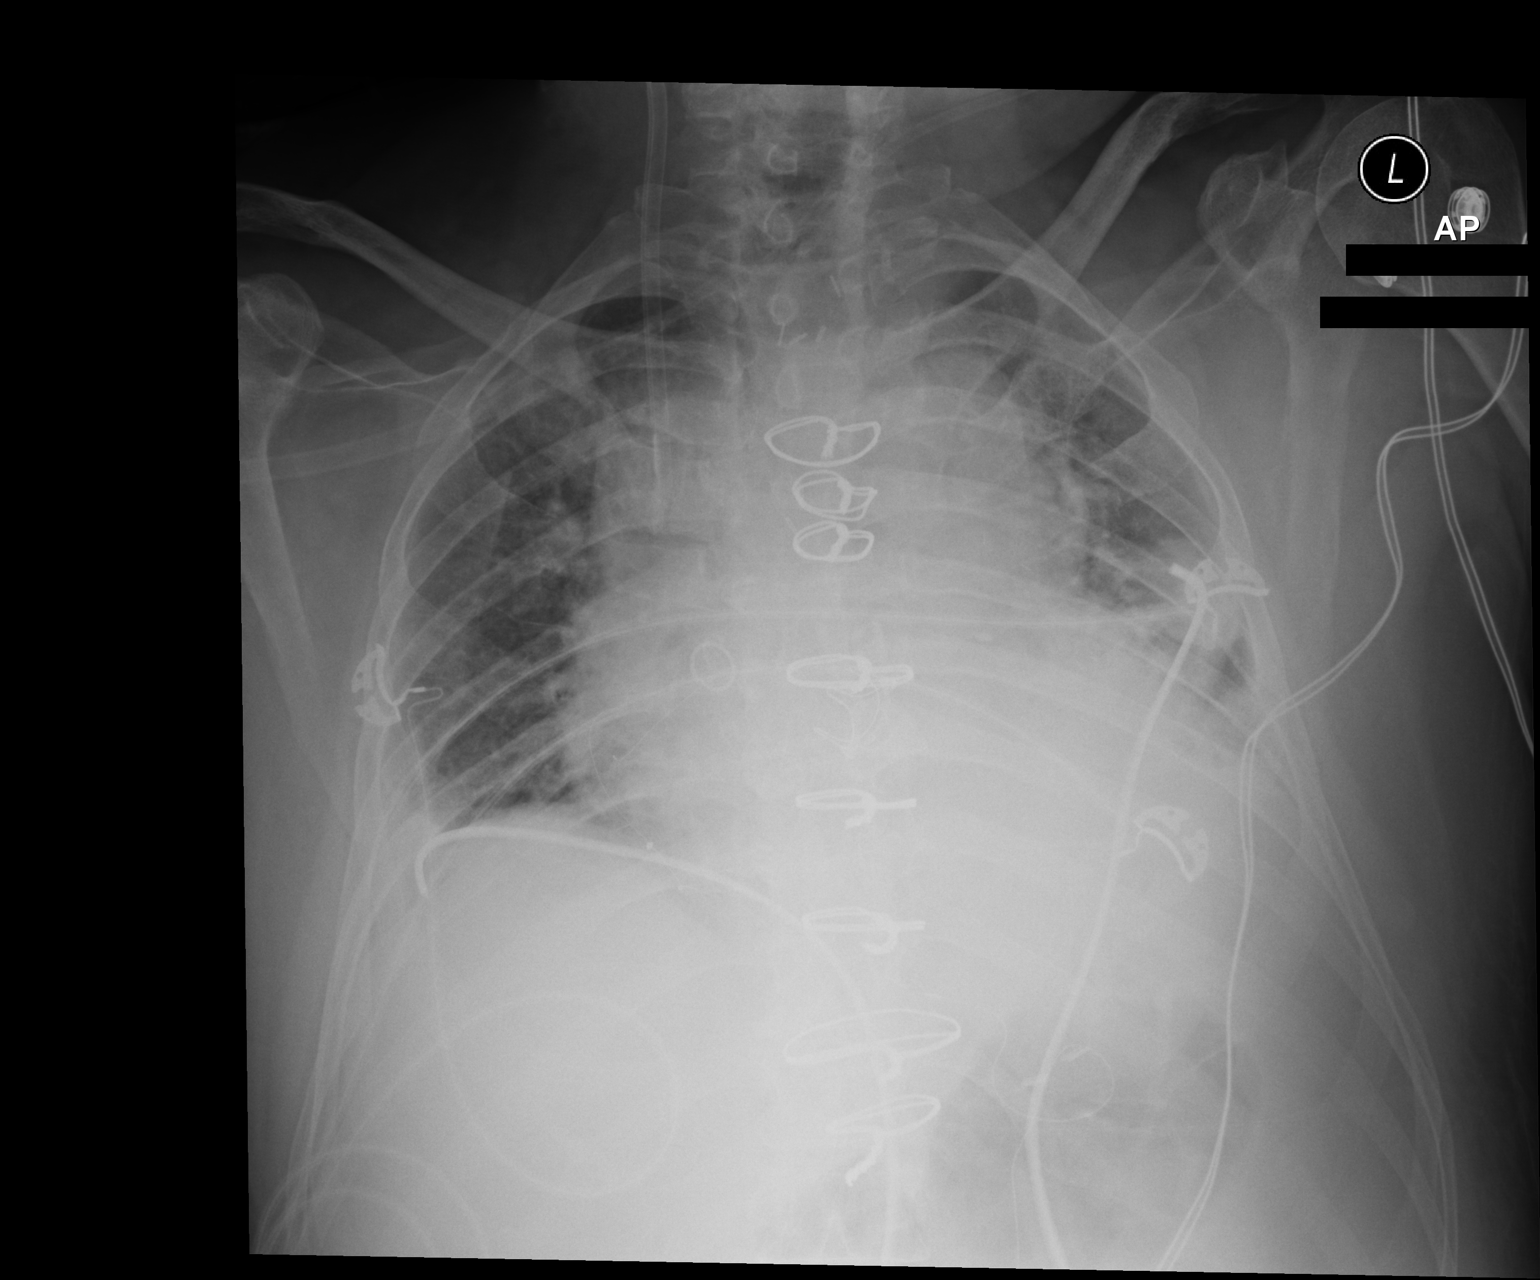

[1 of 1 positions shown; findings below may reference images not displayed]

FINDINGS: Cardiomegaly, CABG and aortic valve replacement changes, and
mediastinal fullness are again noted.

Swan-Ganz catheter, mediastinal tube and left thoracostomy tubes
have been removed.

A right thoracostomy tube and right IJ central venous catheter
sheath remains.

Left lower lung atelectasis remains in this low volume film.

No pneumothorax or other significant change.
IMPRESSION: Support tube removal without other significant change. No
pneumothorax. Continued left lower lung atelectasis.

## 2019-06-23 IMAGING — CR DG CHEST 1V PORT
1 series · 1 of 1 positions shown · non-contrast
Comparison: 12/10/2016.

CLINICAL DATA: Congestive heart failure.

EXAM:
PORTABLE CHEST 1 VIEW

[AP]
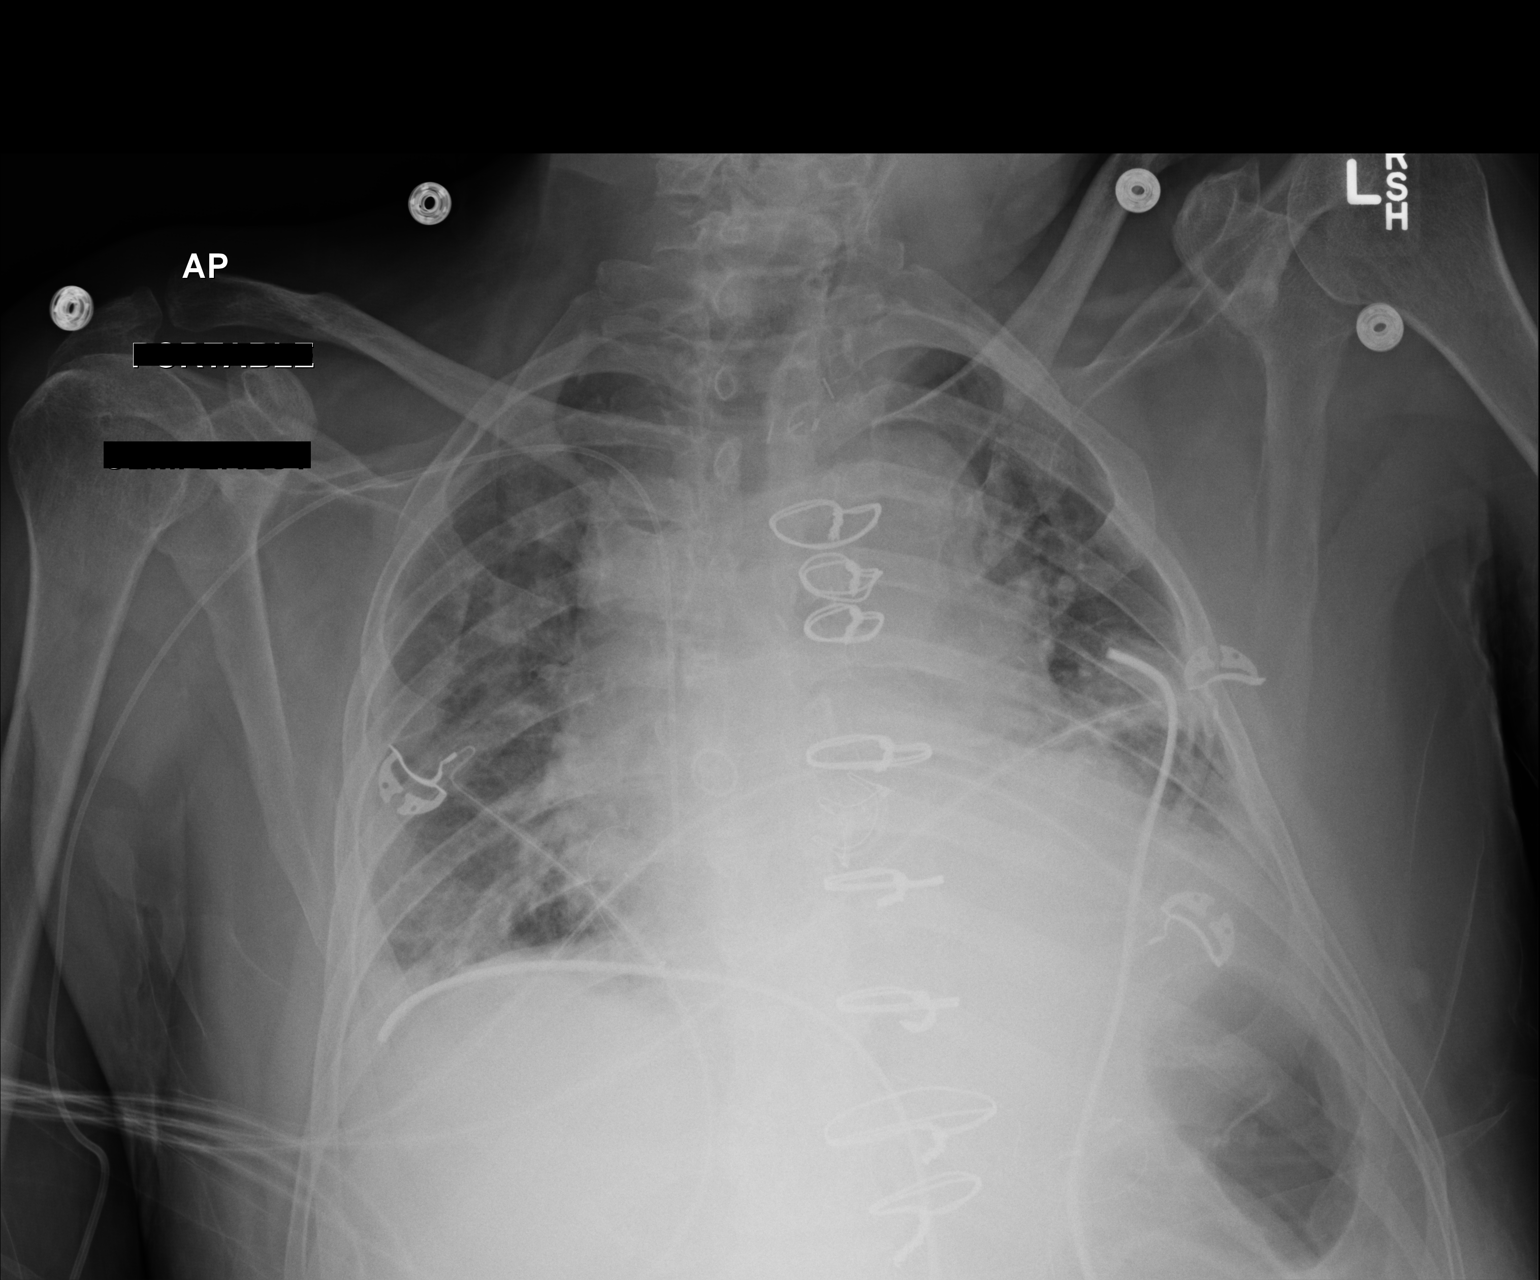

[1 of 1 positions shown; findings below may reference images not displayed]

FINDINGS: Marked, but stable, enlargement cardiac silhouette. PICC line from
RIGHT arm approach lies in the proximal RIGHT atrium, approximately
3 cm below the cavoatrial junction. Joshjax introducer has been
removed. BILATERAL chest tubes unchanged, no pneumothorax. BILATERAL
pulmonary opacities consistent with congestive heart failure.
IMPRESSION: PICC line has been inserted, lies 3 cm below cavoatrial junction. No
pneumothorax. Joshjax introducer removed.

Cardiomegaly with BILATERAL pulmonary opacities consistent
congestive heart failure.

## 2019-06-24 IMAGING — CR DG CHEST 2V
2 series · 2 of 2 positions shown · non-contrast
Comparison: Portable chest x-ray December 11, 2016

CLINICAL DATA: Status post thoracic aortic aneurysm repair as well
than tall procedure and coronary artery bypass grafting on December 07, 2016

EXAM:
CHEST  2 VIEW

[chest lat]
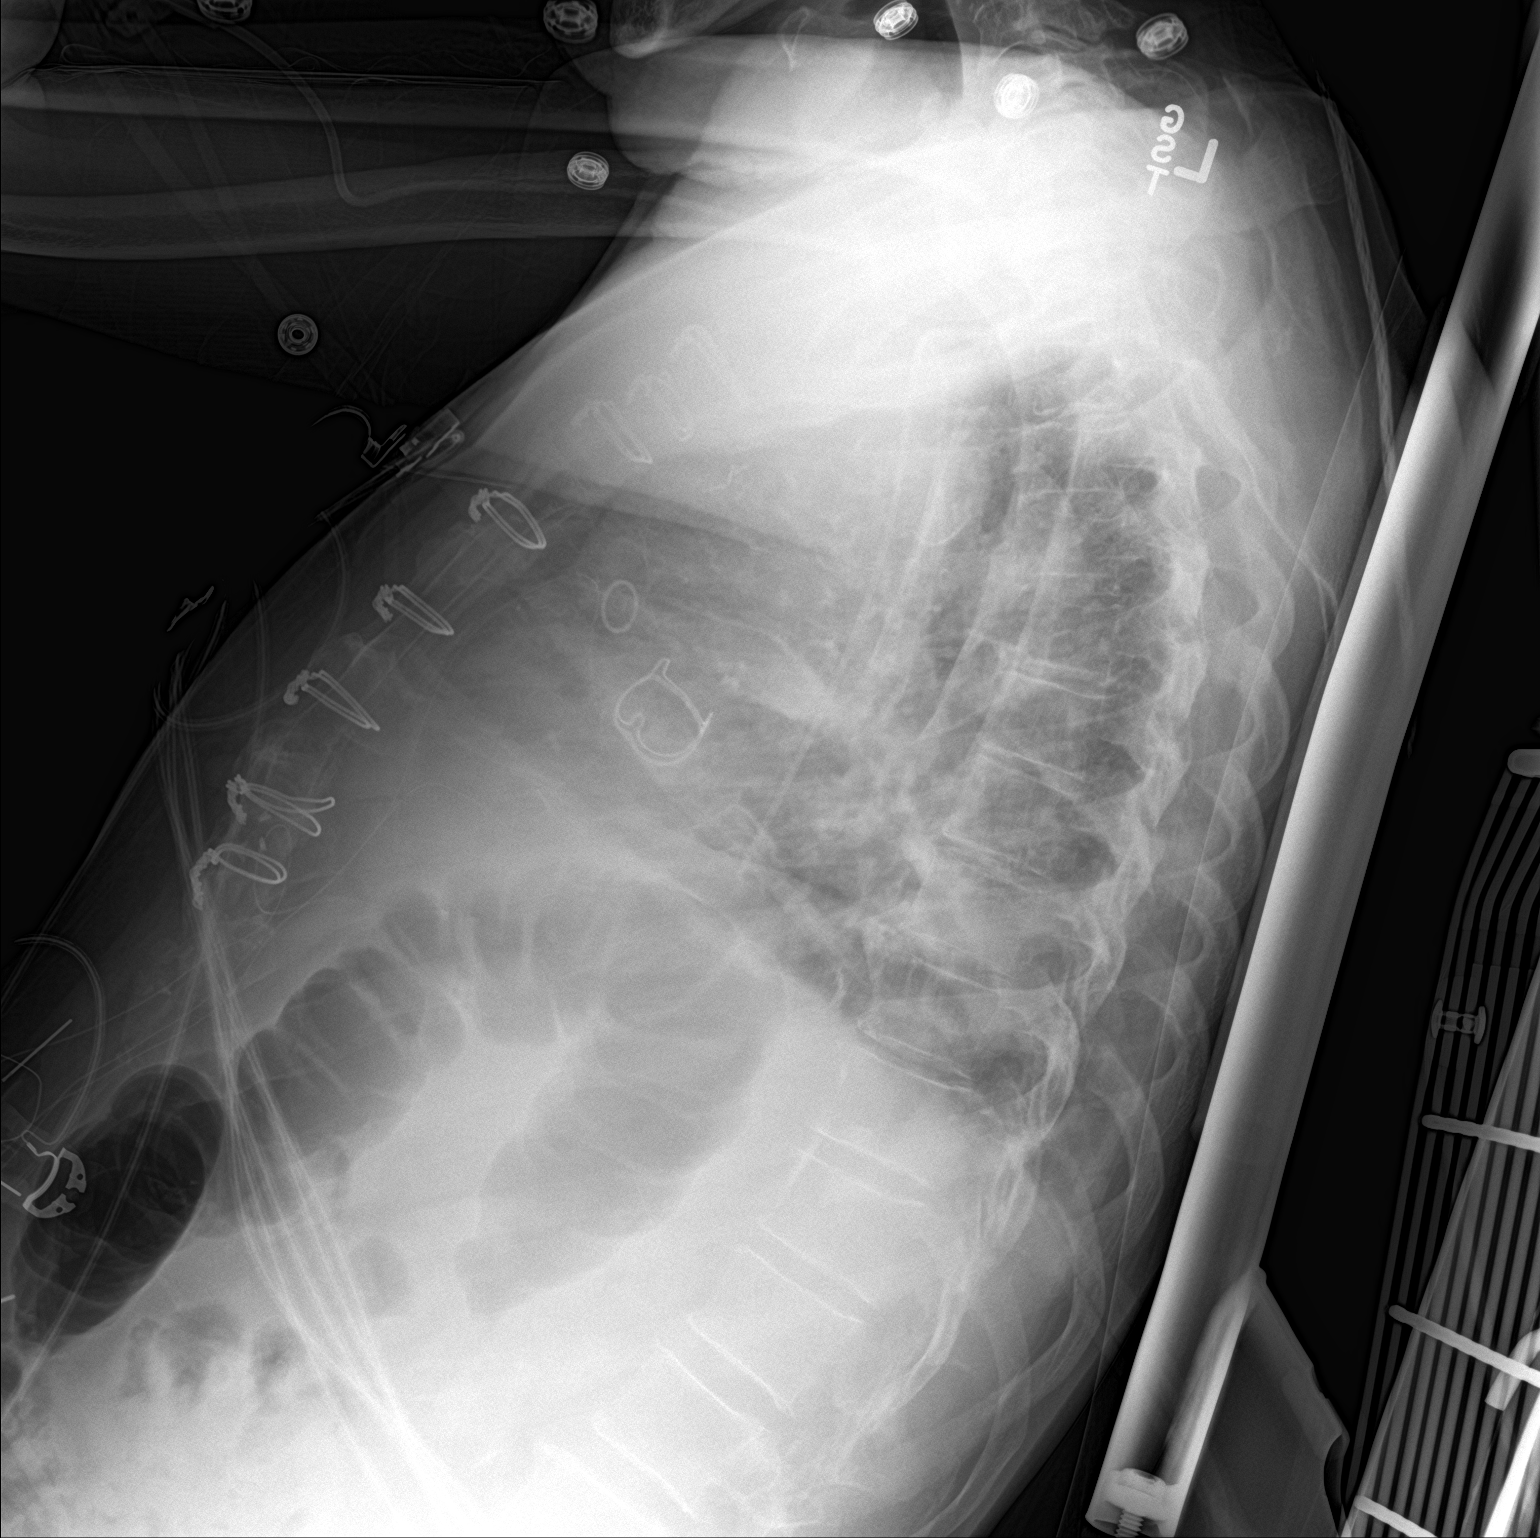

[chest ap]
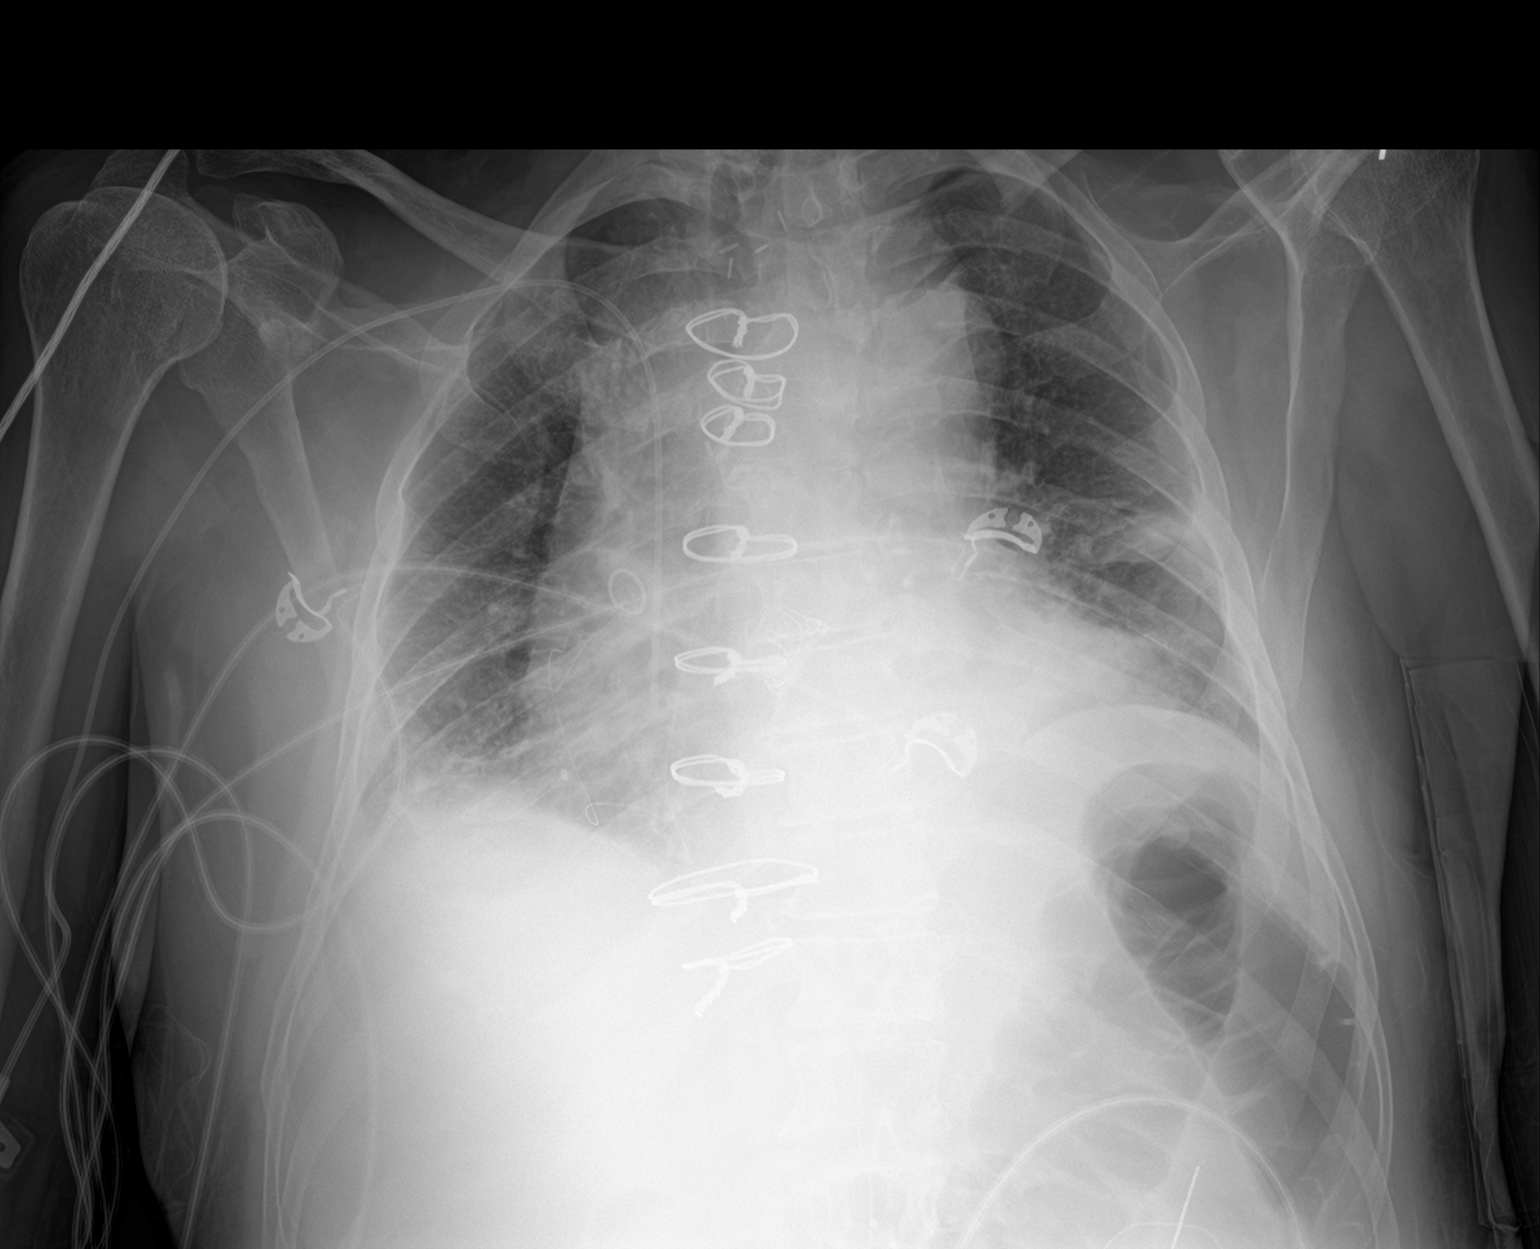

[2 of 2 positions shown; findings below may reference images not displayed]

FINDINGS: The lungs are reasonably well inflated. The interstitial markings
have increased greatly. There is a persistent area of subsegmental
atelectasis at the left lung base there is parenchymal density at
the site of the previous left chest tube. The left chest tube has
been removed. There is a trace of pleural fluid on the right. The
cardiac silhouette remains enlarged. The pulmonary vascularity is
not engorged. The right-sided PICC line tip projects over the distal
third of the SVC.
IMPRESSION: Improved appearance of the pulmonary interstitium bilaterally.
Persistent but improving left basilar subsegmental atelectasis.

## 2020-02-25 DIAGNOSIS — D509 Iron deficiency anemia, unspecified: Secondary | ICD-10-CM | POA: Insufficient documentation

## 2020-05-20 ENCOUNTER — Telehealth: Payer: Self-pay | Admitting: Genetic Counselor

## 2020-05-20 NOTE — Telephone Encounter (Signed)
Called Mr. Boys to offer genetic counseling/testing based on FH questionnaire completed at the cancer center.  Denied filling out a questionnaire or having a family history of cancer.

## 2020-07-28 ENCOUNTER — Telehealth: Payer: Self-pay | Admitting: Oncology

## 2020-07-28 NOTE — Telephone Encounter (Signed)
Patient referred by Dr Joie Bimler for Prostate CA.  Appt made for 08/04/20 Labs 2:45 pm - Consult 3:15 pm  Patient est w/Dr Palermo Jan 2022

## 2020-08-03 NOTE — Progress Notes (Deleted)
[facility]  [address] [place],  [place]  [address] [phone]  Clinic Day:  08/04/2020  Referring physician: [name], MD   HISTORY OF PRESENT ILLNESS:  The patient is a 80 y.o. male who I was asked to consult upon for a rising PSA level.  He underwent a radical prostatectomy in 2003.  Years later, his PSA began to rise to where he was placed on Lupron/bicalutamide.  It brought his PSA level down, but it did not return to an undetectable level.  Earlier this month, his PSA had risen to 2.2. He recently underwent a bone and CT scans which showed no evidence of metastatic disease.  The initial plan was for him to undergo radiation to his prostate bed, but he adamantly refuses to have this modality done.  When inquiring why, he did not give any specific reason.  He denies having any bone pain or other symptoms which concern him for overt disease recurrence.  He is currently of [facility] for his androgen deprivation strategy.  PAST MEDICAL HISTORY:   Past Medical History:  Diagnosis Date   Aortic insufficiency    a. 10/2016 Echo: Mod AI w/ Ao root dil-->s/p Bentall and AVR w/ [facility] Ease pericardial tissue valve (30mm, model # [id number], ser # [id number]).   CAD (coronary artery disease)    a. 11/2016 NSTEMI/Cath: LM nl, LAD 80ost, 70p, 31m, D1 50ost, D2 70, RI nl, LCX 80ost, RCA 95ost/p, 25m, RPDA 90ost, EF 35-45%; b. 11/2016 CABG x 2: LIMA->LAD, VG->RPDA (@ time of Bentall/AVR).   Carotid artery stenosis, asymptomatic, right    History of hiatal hernia    Ischemic cardiomyopathy    a. 11/2016 Echo: EF 40%, diff HK, mild LVH, Gr2 DD, mod AI, 21mm Asc Ao, mild MR, mildly dil LA; b. 01/2017 Echo: EF 40-45%, mild LVH, diff HK, Gr1 DD, Ao bioprosthesis, mild MR, mod TR.   Left carotid artery occlusion    OA (osteoarthritis)    a. Pending R Hip THA   Prostate cancer ([facility])    a. s/p TURP ~ 1999.   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[facility])    Thoracic ascending aortic aneurysm ([facility])     PAST SURGICAL HISTORY:   Past Surgical History:  Procedure Laterality Date   BENTALL PROCEDURE N/A 12/07/2016   Procedure: BIOLOGICAL BENTALL AORTIC ROOT REPLACEMENT;  Surgeon: Rexene Alberts, MD;  Location: [facility];  Service: Open Heart Surgery;  Laterality: N/A;   CORONARY ARTERY BYPASS GRAFT N/A 12/07/2016   Procedure: CORONARY ARTERY BYPASS GRAFTING (CABG) X 2;  Surgeon: Rexene Alberts, MD;  Location: [facility];  Service: Open Heart Surgery;  Laterality: N/A;   ENDOVEIN HARVEST OF GREATER SAPHENOUS VEIN Left 12/07/2016   Procedure: ENDOVEIN HARVEST OF GREATER SAPHENOUS VEIN;  Surgeon: Rexene Alberts, MD;  Location: [facility];  Service: Open Heart Surgery;  Laterality: Left;   LEFT HEART CATH AND CORONARY ANGIOGRAPHY N/A 12/04/2016   Procedure: LEFT HEART CATH AND CORONARY ANGIOGRAPHY;  Surgeon: Nelva Bush, MD;  Location: [facility];  Service: Cardiovascular;  Laterality: N/A;  TEE WITHOUT CARDIOVERSION N/A 12/07/2016   Procedure: TRANSESOPHAGEAL ECHOCARDIOGRAM (TEE);  Surgeon: Rexene Alberts, MD;  Location: [facility];  Service: Open Heart Surgery;  Laterality: N/A;   THORACIC AORTIC ANEURYSM REPAIR N/A 12/07/2016   Procedure: THORACIC ASCENDING ANEURYSM REPAIR (AAA);  Surgeon: Rexene Alberts, MD;  Location: [facility];  Service: Open Heart Surgery;  Laterality: N/A;   TRANSURETHRAL RESECTION OF PROSTATE     a. ~ 1999    CURRENT MEDICATIONS:   Current Outpatient Medications  Medication Sig Dispense Refill   acetaminophen (TYLENOL) 500 MG tablet Take by mouth.     oxyCODONE (OXY IR/ROXICODONE) 5 MG immediate  release tablet TK 1 T PO  Q 4-6 H PRN FOR POST OPERATIVE BREAKTHROUGH PAIN     rosuvastatin (CRESTOR) 40 MG tablet TAKE 1 TABLET(40 MG) BY MOUTH DAILY     alendronate (FOSAMAX) 70 MG tablet Take 70 mg by mouth every Thursday.      aspirin 81 MG chewable tablet Chew 1 tablet (81 mg total) by mouth daily.     atorvastatin (LIPITOR) 80 MG tablet Take 1 tablet (80 mg total) by mouth daily at 6 PM.     bicalutamide (CASODEX) 50 MG tablet Take 50 mg by mouth daily.     calcium-vitamin D (OSCAL WITH D) 500-200 MG-UNIT TABS tablet Take by mouth.     carvedilol (COREG) 3.125 MG tablet Take 1 tablet (3.125 mg total) by mouth 2 (two) times daily. 180 tablet 3   Cholecalciferol (VITAMIN D-3) 5000 units TABS Take 5,000 Units by mouth daily.     clopidogrel (PLAVIX) 75 MG tablet TAKE 1 TABLET BY MOUTH DAILY 90 tablet 1   lisinopril (PRINIVIL,ZESTRIL) 2.5 MG tablet Take 1 tablet (2.5 mg total) by mouth daily. 90 tablet 3   omeprazole (PRILOSEC) 20 MG capsule Take 20 mg by mouth daily.     ondansetron (ZOFRAN) 4 MG tablet Take 1 tablet (4 mg total) by mouth every 4 (four) hours as needed for nausea. 90 tablet 3   prochlorperazine (COMPAZINE) 10 MG tablet Take 1 tablet (10 mg total) by mouth every 6 (six) hours as needed for nausea or vomiting. 90 tablet 3   traMADol (ULTRAM) 50 MG tablet Take 50 mg by mouth every 4-6 hours PRN severe pain. 28 tablet 0   No current facility-administered medications for this visit.    ALLERGIES:   Allergies  Allergen Reactions   Metformin     Other reaction(s): Other (see comments) Diarrhea/lightheadness    FAMILY HISTORY:  His father died from metastatic prostate cancer.  His mother died from diabetes and heart disease.  His 3 brothers are deceased; they died from colon cancer, cirrhosis, and end-stage renal disease, respectively.  He had 3 sisters, 1 of whom died from heart disease.  SOCIAL HISTORY:  The patient was born and raised in [place].  He lives in  [place] with his wife of 66 years.  They have 2 children and 5 grandchildren.  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  There is no history of alcoholism or tobacco abuse.  REVIEW OF SYSTEMS:  Review of Systems  Constitutional:  Negative for fatigue, fever and unexpected weight change.  Respiratory:  Negative for chest tightness, cough, hemoptysis and shortness of breath.   Cardiovascular:  Negative for chest pain and palpitations.  Gastrointestinal:  Negative for abdominal distention, abdominal pain, blood in stool, constipation, diarrhea, nausea and vomiting.  Genitourinary:  Negative for  dysuria, frequency and hematuria.   Musculoskeletal:  Negative for arthralgias, back pain and myalgias.  Skin:  Negative for itching and rash.  Neurological:  Negative for dizziness, headaches and light-headedness.  Psychiatric/Behavioral:  Negative for depression and suicidal ideas. The patient is not nervous/anxious.     PHYSICAL EXAM:  Blood pressure (!) 233/114, pulse 73, temperature 98 F (36.7 C), resp. rate 16, height 5\' 7"  (1.702 m), weight 180 lb 8 oz (81.9 kg), SpO2 100 %. Wt Readings from Last 3 Encounters:  08/15/20 180 lb 14.4 oz (82.1 kg)  08/04/20 180 lb 8 oz (81.9 kg)  07/03/17 155 lb 12 oz (70.6 kg)   Body mass index is 28.27 kg/m. Performance status (ECOG): 1 - Symptomatic but completely ambulatory Physical Exam Constitutional:      Appearance: Normal appearance. He is not ill-appearing.  HENT:     Mouth/Throat:     Mouth: Mucous membranes are moist.     Pharynx: Oropharynx is clear. No oropharyngeal exudate or posterior oropharyngeal erythema.  Cardiovascular:     Rate and Rhythm: Normal rate and regular rhythm.     Heart sounds: No murmur heard.   No friction rub. No gallop.  Pulmonary:     Effort: Pulmonary effort is normal. No respiratory distress.     Breath sounds: Normal breath sounds. No wheezing, rhonchi or rales.  Chest:  Breasts:    Right:  No axillary adenopathy or supraclavicular adenopathy.     Left: No axillary adenopathy or supraclavicular adenopathy.  Abdominal:     General: Bowel sounds are normal. There is no distension.     Palpations: Abdomen is soft. There is no mass.     Tenderness: There is no abdominal tenderness.  Musculoskeletal:        General: No swelling.     Right lower leg: No edema.     Left lower leg: No edema.  Lymphadenopathy:     Cervical: No cervical adenopathy.     Upper Body:     Right upper body: No supraclavicular or axillary adenopathy.     Left upper body: No supraclavicular or axillary adenopathy.     Lower Body: No right inguinal adenopathy. No left inguinal adenopathy.  Skin:    General: Skin is warm.     Coloration: Skin is not jaundiced.     Findings: No lesion or rash.  Neurological:     General: No focal deficit present.     Mental Status: He is alert and oriented to person, place, and time. Mental status is at baseline.     Cranial Nerves: Cranial nerves are intact.  Psychiatric:        Mood and Affect: Mood normal.        Behavior: Behavior normal.        Thought Content: Thought content normal.    ASSESSMENT & PLAN:  A 80 y.o. male who I was asked to consult upon for having a biochemical recurrence of prostate cancer.  Usually, with such a low PSA level and no signs of disease metastasis, I would recommend radiation to the prostate bed as one's disease is likely related to local recurrence.  The patient adamantly refuses radiation and did not give a clear answer as to why despite being encouraged to do so to better understand his thinking.   Based upon this, I will use medication management to hopefully keep his disease in check.  In addition to [place], I will place him on apalutamide 240 mg QD  to foster another form of complete androgen blockade.  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  I will work with [facility] in getting this oral agent  sent to him as quickly as possible.  I will see him back in 6 weeks to reassess his PSA level to see how well his disease has responded to the addition of apalutamide to his [place] therapy.  The patient understands all the plans discussed today and is in agreement with them.  I do appreciate [name], MD for his new consult.   Jalayah Gutridge Macarthur Critchley, MD

## 2020-08-04 ENCOUNTER — Inpatient Hospital Stay: Payer: Medicare Other

## 2020-08-04 ENCOUNTER — Telehealth: Payer: Self-pay | Admitting: Oncology

## 2020-08-04 ENCOUNTER — Other Ambulatory Visit: Payer: Self-pay

## 2020-08-04 ENCOUNTER — Inpatient Hospital Stay: Payer: Medicare Other | Attending: Oncology | Admitting: Oncology

## 2020-08-04 ENCOUNTER — Other Ambulatory Visit: Payer: Self-pay | Admitting: Oncology

## 2020-08-04 DIAGNOSIS — C61 Malignant neoplasm of prostate: Secondary | ICD-10-CM

## 2020-08-04 NOTE — Telephone Encounter (Signed)
Per 5/26 LOS, patient scheduled for July Appt's.  Gave patient Appt Summary

## 2020-08-05 NOTE — Progress Notes (Signed)
Met with patient yesterday, he is worried about the cost of Erleada. Dr. Bobby Rumpf is sending in the Rx to [facility], but there are no open co-pay funds for patients diagnosis. I had him sign an application for free medication through [facility] and [facility], I will fax this in today.

## 2020-08-11 NOTE — Progress Notes (Signed)
Patient was approved for free Erleada through [facility] until 10/08/2020. He will need to sign a Medicare Part D Attestation Form to be approved through the end of the year. I will have him sign this next time he is in. [phone]

## 2020-08-15 ENCOUNTER — Inpatient Hospital Stay: Payer: Medicare Other | Attending: Hematology and Oncology | Admitting: Hematology and Oncology

## 2020-08-15 MED ORDER — PROCHLORPERAZINE MALEATE 10 MG PO TABS: 10.0000 mg | ORAL_TABLET | Freq: Four times a day (QID) | ORAL | 3 refills | Status: DC | PRN

## 2020-08-15 MED ORDER — ONDANSETRON HCL 4 MG PO TABS: 4.0000 mg | ORAL_TABLET | ORAL | 3 refills | Status: DC | PRN

## 2020-08-15 NOTE — Progress Notes (Signed)
The patient is a 80 year old male with prostate cancer.  Patient presents to clinic today for chemotherapy education and palliative care consult.  We will start apalutamide.  We will send in prescriptions for prochlorperazine and ondansetron.  The patient verbalizes understanding of and agreement to the plan as discussed today.  Provided general information including the following: 1.  Date of education: 08/16/2020 2.  Physician name: Dr. Bobby Rumpf 3.  Diagnosis: Prostate cancer 4.  Stage: 5.  Control 6.  Chemotherapy plan including drugs and how often: Apalutamide 7.  Start date: 08/16/2020 8.  Other referrals: None at this time 9.  The patient is to call our office with any questions or concerns.  Our office number [phone], if after hours or on the weekend, call the same number and wait for the answering service.  There is always an oncologist on call 10.  Medications prescribed: Ondansetron, prochlorperazine 11.  The patient has verbalized understanding of the treatment plan and has no barriers to adherence or understanding.  Obtained signed consent from patient.  Discussed symptoms including 1.  Low blood counts including red blood cells, white blood cells and platelets. 2. Infection including to avoid large crowds, wash hands frequently, and stay away from people who were sick.  If fever develops of 100.4 or higher, call our office. 3.  Mucositis-given instructions on mouth rinse (baking soda and salt mixture).  Keep mouth clean.  Use soft bristle toothbrush.  If mouth sores develop, call our clinic. 4.  Nausea/vomiting-gave prescriptions for ondansetron 4 mg every 4 hours as needed for nausea, may take around the clock if persistent.  Compazine 10 mg every 6 hours, may take around the clock if persistent. 5.  Diarrhea-use over-the-counter Imodium.  Call clinic if not controlled. 6.  Constipation-use senna, 1 to 2 tablets twice a day.  If no BM in 2 to 3 days call the clinic. 7.  Loss  of appetite-try to eat small meals every 2-3 hours.  Call clinic if not eating. 8.  Taste changes-zinc 500 mg daily.  If becomes severe call clinic. 9.  Alcoholic beverages. 10.  Drink 2 to 3 quarts of water per day. 11.  Peripheral neuropathy-patient to call if numbness or tingling in hands or feet is persistent  Gave information on the supportive care team and how to contact them regarding services.  Discussed advanced directives.  The patient does not have their advanced directives but will look at the copy provided in their notebook and will call with any questions. [employer] Social worker Advanced directives  Answered questions to patient satisfaction.  Patient is to call with any further questions or concerns.  The medication prescribed to the patient will be printed out from [email or web address] This will give the following information: Name of your medication Approved uses Dose and schedule Storage and handling Handling body fluids and waste Drug and food interactions Possible side effects and management Pregnancy, sexual activity, and contraception Obtaining medication  Dayton Scrape, FNP- [facility]

## 2020-08-17 ENCOUNTER — Telehealth: Payer: Self-pay

## 2020-08-17 NOTE — Telephone Encounter (Signed)
Pt notified of apt made for 08/26/20 2 1pm. He wrote it down, while I was the phone with him. He took his first dose yesterday morning at 7 am.

## 2020-08-17 NOTE — Progress Notes (Signed)
Patient signed the Medicare Part D Attestation Form on 08/15/2020, I faxed it into [facility]. Patient is now approved for free Erleada until 03/11/2021.

## 2020-08-23 ENCOUNTER — Telehealth: Payer: Self-pay

## 2020-08-23 NOTE — Telephone Encounter (Signed)
I spoke with Kevin Burgess. He states, "I'm doing good". Pt taking his medication with his breakfast. He denies missed doses. He denies headaches, increased fatigue, fever, diarrhea, swelling , skin reaction and N/V. Confirmed his next appt this week on 17th @ 1pm..  I reminded pt of the importance of calling us [facility] if temp over 100.4. Pt verbalized understanding.

## 2020-08-26 ENCOUNTER — Inpatient Hospital Stay: Payer: Medicare Other | Admitting: Hematology and Oncology

## 2020-08-26 ENCOUNTER — Inpatient Hospital Stay: Payer: Medicare Other

## 2020-08-29 DIAGNOSIS — C61 Malignant neoplasm of prostate: Secondary | ICD-10-CM | POA: Insufficient documentation

## 2020-08-29 NOTE — Progress Notes (Deleted)
Kevin Burgess  [address] [place],  [place]  [address] [phone]  Clinic Day:  08/04/2020  Referring physician: [name], MD   HISTORY OF PRESENT ILLNESS:  The patient is a 80 y.o. male who I was asked to consult upon for a rising PSA level.  He underwent a radical prostatectomy in 2003. Years later, his PSA level began to rise to where he was placed on Lupron/Casodex.  Initially, his PSA returned to low, but not undetectable, levels.  However, over the past months, there has been a slow, steady climb in his PSA level.  It was 2.2 earlier this month.  He underwent both a bone scan and CT scans, which did not show any evidence of metastatic disease. Of note, he was given a Trelstar injection recently for his biochemical disease recurrence.  The patient denies having any bone pain or other symptoms which have alerted him to having overt disease recurrence.    PAST MEDICAL HISTORY:   Past Medical History:  Diagnosis Date   Aortic insufficiency    a. 10/2016 Echo: Mod AI w/ Ao root dil-->s/p Bentall and AVR w/ [facility] Ease pericardial tissue valve (92mm, model # 3300TFX, ser # [id number]).   CAD (coronary artery disease)    a. 11/2016 NSTEMI/Cath: LM nl, LAD 80ost, 70p, 24m, D1 50ost, D2 70, RI nl, LCX 80ost, RCA 95ost/p, 52m, RPDA 90ost, EF 35-45%; b. 11/2016 CABG x 2: LIMA->LAD, VG->RPDA (@ time of Bentall/AVR).   Carotid artery stenosis, asymptomatic, right    History of hiatal hernia    Ischemic cardiomyopathy    a. 11/2016 Echo: EF 40%, diff HK, mild LVH, Gr2 DD, mod AI, 28mm Asc Ao, mild MR, mildly dil LA; b. 01/2017 Echo: EF 40-45%, mild LVH, diff HK, Gr1 DD, Ao bioprosthesis, mild MR, mod TR.   Left carotid artery occlusion    OA (osteoarthritis)    a. Pending R Hip THA   Prostate cancer ([place])    a. s/p TURP ~ 1999.   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[facility])    Thoracic ascending aortic aneurysm ([place])     PAST SURGICAL HISTORY:   Past Surgical History:  Procedure Laterality Date   BENTALL PROCEDURE N/A 12/07/2016   Procedure: BIOLOGICAL BENTALL AORTIC ROOT REPLACEMENT;  Surgeon: Rexene Alberts, MD;  Location: [place];  Service: Open Heart Surgery;  Laterality: N/A;   CORONARY ARTERY BYPASS GRAFT N/A 12/07/2016   Procedure: CORONARY ARTERY BYPASS GRAFTING (CABG) X 2;  Surgeon: Rexene Alberts, MD;  Location: [place];  Service: Open Heart Surgery;  Laterality: N/A;   ENDOVEIN HARVEST OF GREATER SAPHENOUS VEIN Left 12/07/2016   Procedure: ENDOVEIN HARVEST OF GREATER SAPHENOUS VEIN;  Surgeon: Rexene Alberts, MD;  Location: [place];  Service: Open Heart Surgery;  Laterality: Left;   LEFT HEART CATH AND CORONARY ANGIOGRAPHY N/A 12/04/2016   Procedure: LEFT HEART CATH AND CORONARY ANGIOGRAPHY;  Surgeon: Nelva Bush, MD;  Location: [place] [facility];  Service: Cardiovascular;  Laterality: N/A;   TEE WITHOUT CARDIOVERSION N/A 12/07/2016   Procedure: TRANSESOPHAGEAL ECHOCARDIOGRAM (TEE);  Surgeon: Rexene Alberts, MD;  Location: [place];  Service: Open Heart Surgery;  Laterality: N/A;   THORACIC AORTIC ANEURYSM REPAIR N/A 12/07/2016   Procedure: THORACIC ASCENDING ANEURYSM REPAIR (AAA);  Surgeon: Rexene Alberts, MD;  Location: [place];  Service: Open Heart Surgery;  Laterality: N/A;   TRANSURETHRAL RESECTION OF PROSTATE     a. ~ 1999    CURRENT MEDICATIONS:   Current Outpatient Medications  Medication Sig Dispense Refill   acetaminophen (TYLENOL) 500 MG tablet Take by mouth.     oxyCODONE (OXY IR/ROXICODONE) 5 MG immediate release tablet TK 1 T PO  Q 4-6 H PRN FOR POST OPERATIVE  BREAKTHROUGH PAIN     rosuvastatin (CRESTOR) 40 MG tablet TAKE 1 TABLET(40 MG) BY MOUTH DAILY     alendronate (FOSAMAX) 70 MG tablet Take 70 mg by mouth every Thursday.      aspirin 81 MG chewable tablet Chew 1 tablet (81 mg total) by mouth daily.     atorvastatin (LIPITOR) 80 MG tablet Take 1 tablet (80 mg total) by mouth daily at 6 PM.     bicalutamide (CASODEX) 50 MG tablet Take 50 mg by mouth daily.     calcium-vitamin D (OSCAL WITH D) 500-200 MG-UNIT TABS tablet Take by mouth.     carvedilol (COREG) 3.125 MG tablet Take 1 tablet (3.125 mg total) by mouth 2 (two) times daily. 180 tablet 3   Cholecalciferol (VITAMIN D-3) 5000 units TABS Take 5,000 Units by mouth daily.     clopidogrel (PLAVIX) 75 MG tablet TAKE 1 TABLET BY MOUTH DAILY 90 tablet 1   lisinopril (PRINIVIL,ZESTRIL) 2.5 MG tablet Take 1 tablet (2.5 mg total) by mouth daily. 90 tablet 3   omeprazole (PRILOSEC) 20 MG capsule Take 20 mg by mouth daily.     ondansetron (ZOFRAN) 4 MG tablet Take 1 tablet (4 mg total) by mouth every 4 (four) hours as needed for nausea. 90 tablet 3   prochlorperazine (COMPAZINE) 10 MG tablet Take 1 tablet (10 mg total) by mouth every 6 (six) hours as needed for nausea or vomiting. 90 tablet 3   traMADol (ULTRAM) 50 MG tablet Take 50 mg by mouth every 4-6 hours PRN severe pain. 28 tablet 0   No current facility-administered medications for this visit.    ALLERGIES:   Allergies  Allergen Reactions   Metformin     Other reaction(s): Other (see comments) Diarrhea/lightheadness    FAMILY HISTORY:  His father died from metastatic prostate cancer. One of his 3 deceased brothers died from colon cancer.    SOCIAL HISTORY:  The patient was born and raised in [place].  He lives in the [place] with his wife of 61 years.  They have 2 children and 5 grandchildren.  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  There is no history of alcohol or tobacco use.    REVIEW OF  SYSTEMS:  CNS: The patient denies headaches, changes in hearing, vision, balance or coordination. PULMONARY: The patient denies a productive cough, hemoptysis or baseline shortness of breath. CARDIAC:  The patient denies angina, heart palpitations, or heart failure issues. GI: The patient denies nausea, vomiting, diarrhea, constipation, hematochezia, or weight loss.   GU: The patient denies hematuria, dysuria, or increased urinary frequency.  MUSCULOSKELETAL: The patient denies arthralgias, myalgias, or joint effusions. ENDOCRINE: The patient denies diabetes, hypercholesterolemia, thyroid, or pituitary gland disorders. PSYCHIATRIC: The patient denies depression, anxiety, or other mood disorders. DERMATOLOGIC:  The patient denies petechia, purpura, or other abnormal skin lesions. CONSTITUTIONAL: The patient denies fevers, night sweats, or a decreased energy level.  PHYSICAL EXAM:  Blood pressure (!) 233/114, pulse 73, temperature 98 F (36.7 C), resp. rate 16, height 5\' 7"  (1.702 m), weight 180 lb 8 oz (81.9 kg), SpO2 100 %. Wt Readings from Last 3 Encounters:  08/15/20 180 lb 14.4 oz (82.1 kg)  08/04/20 180 lb 8 oz (81.9 kg)  07/03/17 155 lb 12 oz (70.6 kg)   Body mass index is 28.27 kg/m. Performance status (ECOG): 1 - Symptomatic but completely ambulatory GENERAL: The patient is alert and oriented x3, in no acute distress. HEENT EXAM: Clear oropharynx with no exudate or lesions appreciated. LUNG EXAM: Clear to auscultation bilaterally. CARDIAC EXAM: Regular rate and rhythm. No murmurs, rubs, or gallops. ABDOMINAL EXAM: Soft, nontender and nondistended; no hepatosplenomegaly; normal abdominal bowel sounds EXTREMITY EXAM: No clubbing, cyanosis, or edema. LYMPH NODE SURVEY: No palpable cervical, supraclavicular, axillary, or inguinal lymphadenopathy. NEUROLOGIC EXAM: Cranial nerves II-XII and cerebellar functions are grossly intact. SKIN EXAM: No petechiae, purpura or other abnormal skin  lesions are appreciated.   ASSESSMENT & PLAN:  A 80 y.o. male who I was asked to consult upon for having biochemical recurrence of prostate cancer.  As mentioned previously, I would usually recommend radiation to his prostate bed, particularly as his PSA is low and there was no evidence of metastatic disease.  However, the patient adamantly refuses that have this modality of therapy done.  Based upon this, I will use medication management to treat his biochemical disease recurrence.  As mentioned previously, this gentleman is already taking Trelstar.  I will add apalutamide to his current therapy to facilitate complete androgen blockade.  Apalutamide will be given at 240 mg daily.  I will work with the [facility] in getting this medication sent to him as quickly as possible.  I will see this patient back in 6 weeks to see how well he is doing with the combination of Trelstar/apalutamide.  The patient understands all the plans discussed today and is in agreement with them.  I do appreciate [name], MD for his new consult.   Omkar Stratmann Macarthur Critchley, MD

## 2020-08-29 NOTE — Progress Notes (Signed)
[facility]  [address] [place],  [place]  [address] [phone]  Clinic Day:  08/04/2020  Referring physician: [name], MD   HISTORY OF PRESENT ILLNESS:  The patient is a 80 y.o. male who I was asked to consult upon for a rising PSA level.  He underwent a radical prostatectomy in 2003. Years later, his PSA level began to rise to where he was placed on Lupron/Casodex.  Initially, his PSA returned to low, but not undetectable, levels.  However, over the past months, there has been a slow, steady climb in his PSA level.  It was 2.2 earlier this month.  He underwent both a bone scan and CT scans, which did not show any evidence of metastatic disease. Of note, he was given a Trelstar injection recently for his biochemical disease recurrence.  The patient denies having any bone pain or other symptoms which have alerted him to having overt disease recurrence.    PAST MEDICAL HISTORY:   Past Medical History:  Diagnosis Date   Aortic insufficiency    a. 10/2016 Echo: Mod AI w/ Ao root dil-->s/p Bentall and AVR w/ [facility] Ease pericardial tissue valve (45mm, model # 3300TFX, ser # [id number]).   CAD (coronary artery disease)    a. 11/2016 NSTEMI/Cath: LM nl, LAD 80ost, 70p, 36m, D1 50ost, D2 70, RI nl, LCX 80ost, RCA 95ost/p, 73m, RPDA 90ost, EF 35-45%; b. 11/2016 CABG x 2: LIMA->LAD, VG->RPDA (@ time of Bentall/AVR).   Carotid artery stenosis, asymptomatic, right    History of hiatal hernia    Ischemic cardiomyopathy    a. 11/2016 Echo: EF 40%, diff HK, mild LVH, Gr2 DD, mod AI, 73mm Asc Ao, mild MR, mildly dil LA; b. 01/2017 Echo: EF 40-45%, mild LVH, diff HK, Gr1 DD, Ao bioprosthesis, mild MR, mod TR.   Left carotid artery occlusion    OA (osteoarthritis)    a. Pending R Hip THA   Prostate cancer ([place])    a. s/p TURP ~ 1999.   S/P ascending aortic aneurysm repair 12/07/2016   Hemashield straight graft   S/P Bentall root replacement with  bioprosthetic valve 12/07/2016   21 mm [facility] Ease and 24 mm Gelweave Valsalva aortic root graft with reimplantation of left main coronary artery   S/P CABG x 2 12/07/2016   LIMA to D2, SVG to PDA, EVH via left thigh   Stenosis of right subclavian artery ([facility])    Thoracic ascending aortic aneurysm ([place])     PAST SURGICAL HISTORY:   Past Surgical History:  Procedure Laterality Date   BENTALL PROCEDURE N/A 12/07/2016   Procedure: BIOLOGICAL BENTALL AORTIC ROOT REPLACEMENT;  Surgeon: Rexene Alberts, MD;  Location: [place];  Service: Open Heart Surgery;  Laterality: N/A;   CORONARY ARTERY BYPASS GRAFT N/A 12/07/2016   Procedure: CORONARY ARTERY BYPASS GRAFTING (CABG) X 2;  Surgeon: Rexene Alberts, MD;  Location: [place];  Service: Open Heart Surgery;  Laterality: N/A;   ENDOVEIN HARVEST OF GREATER SAPHENOUS VEIN Left 12/07/2016   Procedure: ENDOVEIN HARVEST OF GREATER SAPHENOUS VEIN;  Surgeon: Rexene Alberts, MD;  Location: [place];  Service: Open Heart Surgery;  Laterality: Left;   LEFT HEART CATH AND CORONARY ANGIOGRAPHY N/A 12/04/2016   Procedure: LEFT HEART CATH AND CORONARY ANGIOGRAPHY;  Surgeon: Nelva Bush, MD;  Location: [place] [facility];  Service: Cardiovascular;  Laterality: N/A;   TEE WITHOUT CARDIOVERSION N/A 12/07/2016   Procedure: TRANSESOPHAGEAL ECHOCARDIOGRAM (TEE);  Surgeon: Rexene Alberts, MD;  Location: [place];  Service: Open Heart Surgery;  Laterality: N/A;   THORACIC AORTIC ANEURYSM REPAIR N/A 12/07/2016   Procedure: THORACIC ASCENDING ANEURYSM REPAIR (AAA);  Surgeon: Rexene Alberts, MD;  Location: [place];  Service: Open Heart Surgery;  Laterality: N/A;   TRANSURETHRAL RESECTION OF PROSTATE     a. ~ 1999    CURRENT MEDICATIONS:   Current Outpatient Medications  Medication Sig Dispense Refill   acetaminophen (TYLENOL) 500 MG tablet Take by mouth.     oxyCODONE (OXY IR/ROXICODONE) 5 MG immediate release tablet TK 1 T PO  Q 4-6 H PRN FOR POST OPERATIVE  BREAKTHROUGH PAIN     rosuvastatin (CRESTOR) 40 MG tablet TAKE 1 TABLET(40 MG) BY MOUTH DAILY     alendronate (FOSAMAX) 70 MG tablet Take 70 mg by mouth every Thursday.      aspirin 81 MG chewable tablet Chew 1 tablet (81 mg total) by mouth daily.     atorvastatin (LIPITOR) 80 MG tablet Take 1 tablet (80 mg total) by mouth daily at 6 PM.     bicalutamide (CASODEX) 50 MG tablet Take 50 mg by mouth daily.     calcium-vitamin D (OSCAL WITH D) 500-200 MG-UNIT TABS tablet Take by mouth.     carvedilol (COREG) 3.125 MG tablet Take 1 tablet (3.125 mg total) by mouth 2 (two) times daily. 180 tablet 3   Cholecalciferol (VITAMIN D-3) 5000 units TABS Take 5,000 Units by mouth daily.     clopidogrel (PLAVIX) 75 MG tablet TAKE 1 TABLET BY MOUTH DAILY 90 tablet 1   lisinopril (PRINIVIL,ZESTRIL) 2.5 MG tablet Take 1 tablet (2.5 mg total) by mouth daily. 90 tablet 3   omeprazole (PRILOSEC) 20 MG capsule Take 20 mg by mouth daily.     ondansetron (ZOFRAN) 4 MG tablet Take 1 tablet (4 mg total) by mouth every 4 (four) hours as needed for nausea. 90 tablet 3   prochlorperazine (COMPAZINE) 10 MG tablet Take 1 tablet (10 mg total) by mouth every 6 (six) hours as needed for nausea or vomiting. 90 tablet 3   traMADol (ULTRAM) 50 MG tablet Take 50 mg by mouth every 4-6 hours PRN severe pain. 28 tablet 0   No current facility-administered medications for this visit.    ALLERGIES:   Allergies  Allergen Reactions   Metformin     Other reaction(s): Other (see comments) Diarrhea/lightheadness    FAMILY HISTORY:  His father died from metastatic prostate cancer. One of his 3 deceased brothers died from colon cancer.    SOCIAL HISTORY:  The patient was born and raised in [place].  He lives in the [place] with his wife of 72 years.  They have 2 children and 5 grandchildren.  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  There is no history of alcohol or tobacco use.    REVIEW OF  SYSTEMS:  CNS: The patient denies headaches, changes in hearing, vision, balance or coordination. PULMONARY: The patient denies a productive cough, hemoptysis or baseline shortness of breath. CARDIAC:  The patient denies angina, heart palpitations, or heart failure issues. GI: The patient denies nausea, vomiting, diarrhea, constipation, hematochezia, or weight loss.   GU: The patient denies hematuria, dysuria, or increased urinary frequency.  MUSCULOSKELETAL: The patient denies arthralgias, myalgias, or joint effusions. ENDOCRINE: The patient denies diabetes, hypercholesterolemia, thyroid, or pituitary gland disorders. PSYCHIATRIC: The patient denies depression, anxiety, or other mood disorders. DERMATOLOGIC:  The patient denies petechia, purpura, or other abnormal skin lesions. CONSTITUTIONAL: The patient denies fevers, night sweats, or a decreased energy level.  PHYSICAL EXAM:  Blood pressure (!) 233/114, pulse 73, temperature 98 F (36.7 C), resp. rate 16, height 5\' 7"  (1.702 m), weight 180 lb 8 oz (81.9 kg), SpO2 100 %. Wt Readings from Last 3 Encounters:  08/15/20 180 lb 14.4 oz (82.1 kg)  08/04/20 180 lb 8 oz (81.9 kg)  07/03/17 155 lb 12 oz (70.6 kg)   Body mass index is 28.27 kg/m. Performance status (ECOG): 1 - Symptomatic but completely ambulatory GENERAL: The patient is alert and oriented x3, in no acute distress. HEENT EXAM: Clear oropharynx with no exudate or lesions appreciated. LUNG EXAM: Clear to auscultation bilaterally. CARDIAC EXAM: Regular rate and rhythm. No murmurs, rubs, or gallops. ABDOMINAL EXAM: Soft, nontender and nondistended; no hepatosplenomegaly; normal abdominal bowel sounds EXTREMITY EXAM: No clubbing, cyanosis, or edema. LYMPH NODE SURVEY: No palpable cervical, supraclavicular, axillary, or inguinal lymphadenopathy. NEUROLOGIC EXAM: Cranial nerves II-XII and cerebellar functions are grossly intact. SKIN EXAM: No petechiae, purpura or other abnormal skin  lesions are appreciated.   ASSESSMENT & PLAN:  A 80 y.o. male who I was asked to consult upon for having biochemical recurrence of prostate cancer.  As mentioned previously, I would usually recommend radiation to his prostate bed, particularly as his PSA is low and there was no evidence of metastatic disease.  However, the patient adamantly refuses that have this modality of therapy done.  Based upon this, I will use medication management to treat his biochemical disease recurrence.  As mentioned previously, this gentleman is already taking Trelstar.  I will add apalutamide to his current therapy to facilitate complete androgen blockade.  Apalutamide will be given at 240 mg daily.  I will work with the [facility] outpatient pharmacy in getting this medication sent to him as quickly as possible.  I will see this patient back in 6 weeks to see how well he is doing with the combination of Trelstar/apalutamide.  The patient understands all the plans discussed today and is in agreement with them.  I do appreciate [name], MD for his new consult.   Asani Mcburney Macarthur Critchley, MD

## 2020-09-05 ENCOUNTER — Encounter: Payer: Self-pay | Admitting: Genetic Counselor

## 2020-09-05 ENCOUNTER — Telehealth: Payer: Self-pay

## 2020-09-05 NOTE — Telephone Encounter (Signed)
I spoke with pt's wife. She states he is doing fine. He has told her, "I cant even tell I'm taking anything different". She states she lays his medication out every morning when he is eating breakfast. No missed doses. No N/V, diarrhea, increased fatigue, swelling, nor skin rashes. I confirmed pt's next appt. I asked her to call us with any questions or concerns.

## 2020-09-08 NOTE — Progress Notes (Signed)
[facility]  [address] [place],  [place]  [address] [phone]  Clinic Day:  09/16/2020  Referring physician: [name], MD  This document serves as a record of services personally performed by Dequincy Macarthur Critchley, MD. It was created on their behalf by [facility] E, a trained medical scribe. The creation of this record is based on the scribe's personal observations and the provider's statements to them.  HISTORY OF PRESENT ILLNESS:  The patient is a 80 y.o. male who I follow for castrate-refractory nonmetastatic prostate cancer.  He comes in today for routine followup.  He continues to take Trelstar/apalutamide for his complete androgen blockade therapy, which he has tolerated without significant difficulty.  The patient denies having any bone pain or other symptoms which have alerted him to having overt disease recurrence.    PHYSICAL EXAM:  Blood pressure (!) 181/81, pulse 69, temperature 98 F (36.7 C), resp. rate 18, height 5\' 7"  (1.702 m), weight 177 lb 9.6 oz (80.6 kg), SpO2 97 %. Wt Readings from Last 3 Encounters:  09/15/20 177 lb 9.6 oz (80.6 kg)  08/15/20 180 lb 14.4 oz (82.1 kg)  08/04/20 180 lb 8 oz (81.9 kg)   Body mass index is 27.82 kg/m. Performance status (ECOG): 1 - Symptomatic but completely ambulatory GENERAL: The patient is alert and oriented x3, in no acute distress. HEENT EXAM: Clear oropharynx with no exudate or lesions appreciated. LUNG EXAM: Clear to auscultation bilaterally. CARDIAC EXAM: Regular rate and rhythm. No murmurs, rubs, or gallops. ABDOMINAL EXAM: Soft, nontender and nondistended; no hepatosplenomegaly; normal abdominal bowel sounds EXTREMITY EXAM: No clubbing, cyanosis, or edema. LYMPH NODE SURVEY: No palpable cervical, supraclavicular, axillary, or inguinal lymphadenopathy. NEUROLOGIC EXAM: Cranial nerves II-XII and cerebellar functions are grossly intact. SKIN EXAM: No petechiae, purpura or other  abnormal skin lesions are appreciated.  LABS:   Ref. Range 09/15/2020 10:14  Prostate Specific Ag, Serum Latest Ref Range: 0.0 - 4.0 ng/mL 0.2     Ref. Range 09/15/2020 00:00  Sodium Latest Ref Range: 137 - 147  141  Potassium Latest Ref Range: 3.4 - 5.3  4.4  Chloride Latest Ref Range: 99 - 108  107  CO2 Latest Ref Range: 13 - 22  23 (A)  Glucose Unknown 87  BUN Latest Ref Range: 4 - 21  31 (A)  Creatinine Latest Ref Range: 0.6 - 1.3  1.3  Calcium Latest Ref Range: 8.7 - 10.7  8.9  Alkaline Phosphatase Latest Ref Range: 25 - 125  74  Albumin Latest Ref Range: 3.5 - 5.0  4.1  AST Latest Ref Range: 14 - 40  30  ALT Latest Ref Range: 10 - 40  14  WBC Unknown 5.9  RBC Latest Ref Range: 3.87 - 5.11  3.76 (A)  Hemoglobin Latest Ref Range: 13.5 - 17.5  11.4 (A)  HCT Latest Ref Range: 41 - 53  35 (A)  Platelets Latest Ref Range: 150 - 399  166  NEUT# Unknown 3.48    ASSESSMENT & PLAN:  A 80 y.o. 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   Based upon his PSA today,  his complete androgen blockade therapy is working well.  His PSA was 2.2 before he began taking his apalutamide.  He will continue taking Trelstar/apalutamide for his disease management until there becomes evidence of his disease losing sensitivity to this regimen.  As he is doing well, I will see him back in 3 months for repeat clinical assessment.  The patient understands all the plans discussed today and is in agreement with them.   I, Rita Ohara, am acting as scribe for Marice Potter, MD    I have reviewed this report as typed by the medical scribe, and it is complete and accurate.  Dequincy Macarthur Critchley, MD

## 2020-09-15 ENCOUNTER — Telehealth: Payer: Self-pay | Admitting: Oncology

## 2020-09-15 ENCOUNTER — Other Ambulatory Visit: Payer: Self-pay | Admitting: Oncology

## 2020-09-15 ENCOUNTER — Inpatient Hospital Stay (INDEPENDENT_AMBULATORY_CARE_PROVIDER_SITE_OTHER): Payer: Medicare Other | Admitting: Oncology

## 2020-09-15 ENCOUNTER — Other Ambulatory Visit: Payer: Self-pay | Admitting: Hematology and Oncology

## 2020-09-15 ENCOUNTER — Inpatient Hospital Stay: Payer: Medicare Other | Attending: Hematology and Oncology

## 2020-09-15 ENCOUNTER — Other Ambulatory Visit: Payer: Self-pay

## 2020-09-15 VITALS — BP 181/81 | HR 69 | Temp 98.0°F | Resp 18 | Ht 67.0 in | Wt 177.6 lb

## 2020-09-15 DIAGNOSIS — C61 Malignant neoplasm of prostate: Secondary | ICD-10-CM

## 2020-09-15 LAB — BASIC METABOLIC PANEL
BUN: 31 — AB (ref 4–21)
CO2: 23 — AB (ref 13–22)
Chloride: 107 (ref 99–108)
Creatinine: 1.3 (ref 0.6–1.3)
Glucose: 87
Potassium: 4.4 (ref 3.4–5.3)
Sodium: 141 (ref 137–147)

## 2020-09-15 LAB — HEPATIC FUNCTION PANEL
ALT: 14 (ref 10–40)
AST: 30 (ref 14–40)
Alkaline Phosphatase: 74 (ref 25–125)

## 2020-09-15 LAB — CBC AND DIFFERENTIAL
HCT: 35 — AB (ref 41–53)
Hemoglobin: 11.4 — AB (ref 13.5–17.5)
Neutrophils Absolute: 3.48
Platelets: 166 (ref 150–399)
WBC: 5.9

## 2020-09-15 LAB — COMPREHENSIVE METABOLIC PANEL
Albumin: 4.1 (ref 3.5–5.0)
Calcium: 8.9 (ref 8.7–10.7)

## 2020-09-15 LAB — CBC: RBC: 3.76 — AB (ref 3.87–5.11)

## 2020-09-15 NOTE — Telephone Encounter (Signed)
Per 7/7 los next appt scheduled and given to patient 

## 2020-09-16 ENCOUNTER — Telehealth: Payer: Self-pay

## 2020-09-16 LAB — PROSTATE-SPECIFIC AG, SERUM (LABCORP): Prostate Specific Ag, Serum: 0.2 ng/mL (ref 0.0–4.0)

## 2020-09-16 NOTE — Telephone Encounter (Signed)
Dr Bobby Rumpf states PSA much better. PSA level is 0.2.  I called, and spoke to pt's wife. I notified her of above, she was pleased to hear that.

## 2020-09-21 ENCOUNTER — Telehealth: Payer: Self-pay

## 2020-09-21 NOTE — Telephone Encounter (Signed)
I spoke with pt's wife,Kevin Burgess. She states Kevin Burgess is doing fine with the [place]. He takes the medication in the morning. No missed doses. Denies N/V . "He hasn't taken the first pill for nausea" per his wife. No skin reactions, no diarrhea, and no fevers. He has to go to see Dr Nila Nephew in Sept for his next hormone injection. I confirmed next appt for 11/02/20. I reminded her to call us if temp is 100.4 or higher. She verbalized understanding.

## 2020-10-20 ENCOUNTER — Telehealth: Payer: Self-pay

## 2020-10-20 NOTE — Telephone Encounter (Addendum)
10/21/20 @ 0929. I spoke with pt's wife. She states he hasn't had the first problem. He is taking his medication in the mornings. No missed doses. I confirmed next appt for 11/02/20.   10/20/20 @ 1328, I attempted call to pt to see how he is tolerating the [place]. No one answered the phone.

## 2020-11-02 ENCOUNTER — Other Ambulatory Visit: Payer: Self-pay | Admitting: Hematology and Oncology

## 2020-11-02 ENCOUNTER — Inpatient Hospital Stay: Payer: Medicare Other | Attending: Hematology and Oncology | Admitting: Hematology and Oncology

## 2020-11-02 ENCOUNTER — Inpatient Hospital Stay: Payer: Medicare Other

## 2020-11-02 ENCOUNTER — Encounter: Payer: Self-pay | Admitting: Hematology and Oncology

## 2020-11-02 ENCOUNTER — Other Ambulatory Visit: Payer: Self-pay

## 2020-11-02 DIAGNOSIS — Z79899 Other long term (current) drug therapy: Secondary | ICD-10-CM | POA: Diagnosis not present

## 2020-11-02 DIAGNOSIS — C61 Malignant neoplasm of prostate: Secondary | ICD-10-CM | POA: Insufficient documentation

## 2020-11-02 DIAGNOSIS — D539 Nutritional anemia, unspecified: Secondary | ICD-10-CM | POA: Insufficient documentation

## 2020-11-02 DIAGNOSIS — I351 Nonrheumatic aortic (valve) insufficiency: Secondary | ICD-10-CM | POA: Diagnosis not present

## 2020-11-02 DIAGNOSIS — R000 Tachycardia, unspecified: Secondary | ICD-10-CM | POA: Diagnosis not present

## 2020-11-02 DIAGNOSIS — K449 Diaphragmatic hernia without obstruction or gangrene: Secondary | ICD-10-CM | POA: Insufficient documentation

## 2020-11-02 DIAGNOSIS — Z8042 Family history of malignant neoplasm of prostate: Secondary | ICD-10-CM | POA: Diagnosis not present

## 2020-11-02 DIAGNOSIS — E875 Hyperkalemia: Secondary | ICD-10-CM | POA: Insufficient documentation

## 2020-11-02 DIAGNOSIS — Z800 Family history of malignant neoplasm of digestive organs: Secondary | ICD-10-CM | POA: Insufficient documentation

## 2020-11-02 DIAGNOSIS — D509 Iron deficiency anemia, unspecified: Secondary | ICD-10-CM | POA: Diagnosis not present

## 2020-11-02 DIAGNOSIS — Z79818 Long term (current) use of other agents affecting estrogen receptors and estrogen levels: Secondary | ICD-10-CM | POA: Diagnosis not present

## 2020-11-02 DIAGNOSIS — I2510 Atherosclerotic heart disease of native coronary artery without angina pectoris: Secondary | ICD-10-CM | POA: Diagnosis not present

## 2020-11-02 DIAGNOSIS — I429 Cardiomyopathy, unspecified: Secondary | ICD-10-CM | POA: Insufficient documentation

## 2020-11-02 LAB — HEPATIC FUNCTION PANEL
ALT: 12 (ref 10–40)
AST: 29 (ref 14–40)
Alkaline Phosphatase: 69 (ref 25–125)
Bilirubin, Total: 0.4

## 2020-11-02 LAB — TSH: TSH: 4.942 u[IU]/mL — ABNORMAL HIGH (ref 0.350–4.500)

## 2020-11-02 LAB — BASIC METABOLIC PANEL
BUN: 27 — AB (ref 4–21)
CO2: 24 — AB (ref 13–22)
Chloride: 104 (ref 99–108)
Creatinine: 1.3 (ref 0.6–1.3)
Glucose: 97
Potassium: 5.3 (ref 3.4–5.3)
Sodium: 136 — AB (ref 137–147)

## 2020-11-02 LAB — FOLATE: Folate: 12.2 ng/mL (ref >5.9)

## 2020-11-02 LAB — CBC AND DIFFERENTIAL
HCT: 34 — AB (ref 41–53)
Hemoglobin: 11.4 — AB (ref 13.5–17.5)
Neutrophils Absolute: 3.72
Platelets: 166 (ref 150–399)
WBC: 5.9

## 2020-11-02 LAB — COMPREHENSIVE METABOLIC PANEL
Albumin: 4 (ref 3.5–5.0)
Calcium: 9 (ref 8.7–10.7)

## 2020-11-02 LAB — CBC: RBC: 3.6 — AB (ref 3.87–5.11)

## 2020-11-02 LAB — VITAMIN B12: Vitamin B-12: 213 pg/mL (ref 180–914)

## 2020-11-02 NOTE — Assessment & Plan Note (Addendum)
Unlikely due to current treatment. He is seeing cardiologist for further evaluation. I will order TSH.

## 2020-11-02 NOTE — Assessment & Plan Note (Addendum)
Patient tolerating treatment well. Will continue as prescribed. He requests that we begin giving his Lupron injections. We verified with Dr. Ara Kussmaul office that they are in agreement with this. Will return for follow-up as scheduled with labs and MD visit.

## 2020-11-02 NOTE — Assessment & Plan Note (Signed)
Cause unknown, will order additional workup

## 2020-11-02 NOTE — Progress Notes (Signed)
[facility]  [address] [place],  [place]  [address] [phone]  Clinic Day:  11/03/2020  Referring physician: [name], MD  ASSESSMENT & PLAN:   Assessment & Plan: Prostate cancer [facility]) Patient tolerating treatment well. Will continue as prescribed. He requests that we begin giving his Lupron injections. We verified with Dr. Ara Kussmaul office that they are in agreement with this. Will return for follow-up as scheduled with labs and MD visit.  Deficiency anemia Cause unknown, will order additional workup   Tachycardia Unlikely due to current treatment. He is seeing cardiologist for further evaluation. I will order TSH.  Hyperkalemia Borderline, we will continue to monitor   The patient understands the plans discussed today and is in agreement with them.  He knows to contact our office if he develops concerns prior to his next appointment.   I provided 20 minutes of face-to-face time during this encounter and > 50% was spent counseling as documented under my assessment and plan.    Marvia Pickles, PA-C   Orders Placed This Encounter  Procedures   TSH    Standing Status:   Future    Number of Occurrences:   1    Standing Expiration Date:   11/02/2021   Vitamin B12    Standing Status:   Future    Number of Occurrences:   1    Standing Expiration Date:   11/02/2021   Folate    Standing Status:   Future    Number of Occurrences:   1    Standing Expiration Date:   11/02/2021   CBC with Differential ([place] Only)    Standing Status:   Standing    Number of Occurrences:   33    Standing Expiration Date:   11/02/2021   CMP ([place] only)    Standing Status:   Standing    Number of Occurrences:   50    Standing Expiration Date:   11/02/2021      CHIEF COMPLAINT:  CC:   Recurrent prostate cancer with rising PSA  Current Treatment:  Lupron and Erleada   HISTORY OF PRESENT ILLNESS:    Oncology History  Prostate  cancer ([place])  08/04/2020 Initial Diagnosis   Prostate cancer ([place])   08/04/2020 Initial Diagnosis   Was referred for recurrent prostate cancer with rising PSA despite Lupron and Casodex   08/16/2020 -  Chemotherapy   Started apalutamide 08/16/2020 for refractory disease      09/15/2020 Tumor Marker   Patient's tumor was tested for the following markers: PSA. Results of the tumor marker test revealed 0.2.      INTERVAL HISTORY:  Kevin Burgess is here today for repeat clinical assessment prior to starting a 3rd month of apalutamide. He states he continues to tolerate apalutamide without difficulty.  He denies missed doses. He has occasional loose stools, but denies frank diarrhea.  He has not had nausea, so has not had to take Zofran or Compazine.  He states he saw [place] cardiologist on August 19th and was placed on diltiazem 120 mg daily for fast heart rate.  He has been referred to a cardiologist and [place] for further treatment. He denies fevers or chills. He denies pain. His appetite is good. His weight has been stable.  Review of his medications with his wife reveals he is no longer taking alendronate, atorvastatin, oxycodone or tramadol.  He is taking vitamin B 12, 2500 mcg daily, in addition to his  other medications.  REVIEW OF SYSTEMS:  Review of Systems  Constitutional:  Negative for appetite change, chills, fatigue, fever and unexpected weight change.  HENT:   Negative for lump/mass, mouth sores and sore throat.   Respiratory:  Negative for cough and shortness of breath.   Cardiovascular:  Negative for chest pain and leg swelling.  Gastrointestinal:  Negative for abdominal pain, constipation, diarrhea, nausea and vomiting.  Genitourinary:  Negative for difficulty urinating, dysuria, frequency and hematuria.   Musculoskeletal:  Negative for arthralgias, back pain and myalgias.  Skin:  Negative for itching, rash and wound.  Neurological:  Negative for dizziness, extremity weakness, headaches,  light-headedness and numbness.  Hematological:  Negative for adenopathy.  Psychiatric/Behavioral:  Negative for depression and sleep disturbance. The patient is not nervous/anxious.     VITALS:  Blood pressure (!) 134/92, pulse (!) 113, temperature 98.5 F (36.9 C), temperature source Oral, resp. rate 18, height '5\' 7"'$  (1.702 m), weight 178 lb 4.8 oz (80.9 kg), SpO2 96 %.  Wt Readings from Last 3 Encounters:  11/02/20 178 lb 4.8 oz (80.9 kg)  09/15/20 177 lb 9.6 oz (80.6 kg)  08/15/20 180 lb 14.4 oz (82.1 kg)    Body mass index is 27.93 kg/m.  Performance status (ECOG): 0 - Asymptomatic  PHYSICAL EXAM:  Physical Exam Vitals and nursing note reviewed.  Constitutional:      General: He is not in acute distress.    Appearance: Normal appearance. He is normal weight.  HENT:     Head: Normocephalic and atraumatic.     Mouth/Throat:     Mouth: Mucous membranes are moist.     Pharynx: Oropharynx is clear. No oropharyngeal exudate or posterior oropharyngeal erythema.  Eyes:     General: No scleral icterus.    Extraocular Movements: Extraocular movements intact.     Conjunctiva/sclera: Conjunctivae normal.     Pupils: Pupils are equal, round, and reactive to light.  Cardiovascular:     Rate and Rhythm: Tachycardia present. Rhythm regularly irregular.  Pulmonary:     Effort: Pulmonary effort is normal.     Breath sounds: Normal breath sounds. No wheezing, rhonchi or rales.  Abdominal:     General: Bowel sounds are normal. There is no distension.     Palpations: Abdomen is soft. There is no hepatomegaly, splenomegaly or mass.     Tenderness: There is no abdominal tenderness.  Musculoskeletal:        General: Normal range of motion.     Cervical back: Normal range of motion and neck supple. No tenderness.     Right lower leg: No edema.     Left lower leg: No edema.  Lymphadenopathy:     Cervical: No cervical adenopathy.     Upper Body:     Right upper body: No supraclavicular or  axillary adenopathy.     Left upper body: No supraclavicular or axillary adenopathy.     Lower Body: No right inguinal adenopathy. No left inguinal adenopathy.  Skin:    General: Skin is warm and dry.     Coloration: Skin is not jaundiced.     Findings: No rash.  Neurological:     Mental Status: He is alert and oriented to person, place, and time.     Cranial Nerves: No cranial nerve deficit.  Psychiatric:        Mood and Affect: Mood normal.        Behavior: Behavior normal.        Thought  Content: Thought content normal.    LABS:   CBC Latest Ref Rng & Units 11/02/2020 09/15/2020 04/10/2017  WBC - 5.9 5.9 6.9  Hemoglobin 13.5 - 17.5 11.4(A) 11.4(A) 10.7(L)  Hematocrit 41 - 53 34(A) 35(A) 32.8(L)  Platelets 150 - 399 166 166 277   CMP Latest Ref Rng & Units 11/02/2020 09/15/2020 01/11/2017  Glucose 65 - 99 mg/dL - - 95  BUN 4 - 21 27(A) 31(A) 16  Creatinine 0.6 - 1.3 1.3 1.3 1.03  Sodium 137 - 147 136(A) 141 136  Potassium 3.4 - 5.3 5.3 4.4 4.4  Chloride 99 - 108 104 107 104  CO2 13 - 22 24(A) 23(A) 23  Calcium 8.7 - 10.7 9.0 8.9 9.0  Total Protein 6.5 - 8.1 g/dL - - 6.9  Total Bilirubin 0.3 - 1.2 mg/dL - - 1.2  Alkaline Phos 25 - 125 69 74 182(H)  AST 14 - 40 29 30 58(H)  ALT 10 - 40 12 14 34     No results found for: CEA1 / No results found for: CEA1 Lab Results  Component Value Date   PSA1 0.2 09/15/2020   No results found for: [id number] No results found for: [id number]  No results found for: TOTALPROTELP, ALBUMINELP, A1GS, A2GS, BETS, BETA2SER, GAMS, MSPIKE, SPEI No results found for: TIBC, FERRITIN, IRONPCTSAT No results found for: LDH  STUDIES:  No results found.    HISTORY:   Past Medical History:  Diagnosis Date   Aortic insufficiency    a. 10/2016 Echo: Mod AI w/ Ao root dil-->s/p Bentall and AVR w/ [facility] Ease pericardial tissue valve (38m, model # [id number], ser # [id number].   CAD (coronary artery disease)    a. 11/2016 NSTEMI/Cath: LM nl, LAD 80ost,  70p, 848mD1 50ost, D2 70, RI nl, LCX 80ost, RCA 95ost/p, 3012mPDA 90ost, EF 35-45%; b. 11/2016 CABG x 2: LIMA->LAD, VG->RPDA (@ time of Bentall/AVR).   Carotid artery stenosis, asymptomatic, right    History of hiatal hernia    Ischemic cardiomyopathy    a. 11/2016 Echo: EF 40%, diff HK, mild LVH, Gr2 DD, mod AI, 63m9mc Ao, mild MR, mildly dil LA; b. 01/2017 Echo: EF 40-45%, mild LVH, diff HK, Gr1 DD, Ao bioprosthesis, mild MR, mod TR.   Left carotid artery occlusion    OA (osteoarthritis)    a. Pending R Hip THA   Prostate cancer (HCC)[place] a. s/p TURP ~ 1999.   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/2016   LIMA to D2, SVG to PDA, EVH via left thigh   Stenosis of right subclavian artery (HCC)    [facility]  Past Surgical History:  Procedure Laterality Date   BENTALL PROCEDURE N/A 12/07/2016   Procedure: BIOLOGICAL BENTALL AORTIC ROOT REPLACEMENT;  Surgeon: OwenRexene Alberts;  [facility];  Laterality: N/A;   CORONARY ARTERY BYPASS GRAFT N/A 12/07/2016   Procedure: CORONARY ARTERY BYPASS GRAFTING (CABG) X 2;  Surgeon: OwenRexene Alberts;  [facility];  Laterality: N/A;   ENDOVEIN HARVEST OF GREATER SAPHENOUS VEIN Left 12/07/2016   Procedure: ENDOVEIN HARVEST OF GREATER SAPHENOUS VEIN;  Surgeon: Rexene Alberts, MD;  Location: [facility];  Service: Open Heart Surgery;  Laterality: Left;   LEFT HEART CATH AND CORONARY ANGIOGRAPHY N/A 12/04/2016   Procedure: LEFT HEART CATH AND CORONARY ANGIOGRAPHY;  Surgeon: Nelva Bush, MD;  Location: [facility] CV LAB;  Service: Cardiovascular;  Laterality: N/A;   TEE WITHOUT CARDIOVERSION N/A 12/07/2016    Procedure: TRANSESOPHAGEAL ECHOCARDIOGRAM (TEE);  Surgeon: Rexene Alberts, MD;  Location: [facility];  Service: Open Heart Surgery;  Laterality: N/A;   THORACIC AORTIC ANEURYSM REPAIR N/A 12/07/2016   Procedure: THORACIC ASCENDING ANEURYSM REPAIR (AAA);  Surgeon: Rexene Alberts, MD;  Location: [facility];  Service: Open Heart Surgery;  Laterality: N/A;   TRANSURETHRAL RESECTION OF PROSTATE     a. ~ 1999    Family History  Problem Relation Age of Onset   Prostate cancer Father 60   Colon cancer Son 81    Social History:  reports that he has never smoked. He has never used smokeless tobacco. He reports that he does not drink alcohol and does not use drugs.The patient is accompanied by his wife today.  Allergies:  Allergies  Allergen Reactions   Metformin     Other reaction(s): Other (see comments) Diarrhea/lightheadness    Current Medications: Current Outpatient Medications  Medication Sig Dispense Refill   acetaminophen (TYLENOL) 500 MG tablet Take by mouth.     apalutamide (ERLEADA) 60 MG tablet Take 4 tablets by mouth daily.     aspirin 81 MG chewable tablet Chew 1 tablet (81 mg total) by mouth daily.     calcium-vitamin D (OSCAL WITH D) 500-200 MG-UNIT TABS tablet Take by mouth.     carvedilol (COREG) 6.25 MG tablet Take 6.25 mg by mouth 2 (two) times daily.     Cholecalciferol (VITAMIN D-3) 5000 units TABS Take 5,000 Units by mouth daily.     clopidogrel (PLAVIX) 75 MG tablet TAKE 1 TABLET BY MOUTH DAILY 90 tablet 1   diltiazem (CARDIZEM CD) 120 MG 24 hr capsule Take 120 mg by mouth daily.     lisinopril (PRINIVIL,ZESTRIL) 2.5 MG tablet Take 1 tablet (2.5 mg total) by mouth daily. 90 tablet 3   omeprazole (PRILOSEC) 20 MG capsule Take 20 mg by mouth daily.     ondansetron (ZOFRAN) 4 MG tablet Take 1 tablet (4 mg total) by mouth every 4 (four) hours as needed for nausea. 90 tablet 3   prochlorperazine (COMPAZINE) 10 MG tablet Take 1 tablet (10 mg total) by mouth every 6 (six) hours as  needed for nausea or vomiting. 90 tablet 3   rosuvastatin (CRESTOR) 40 MG tablet TAKE 1 TABLET(40 MG) BY MOUTH DAILY     No current facility-administered medications for this visit.

## 2020-11-03 ENCOUNTER — Encounter: Payer: Self-pay | Admitting: Hematology and Oncology

## 2020-11-03 ENCOUNTER — Encounter: Payer: Self-pay | Admitting: Oncology

## 2020-11-03 DIAGNOSIS — E875 Hyperkalemia: Secondary | ICD-10-CM | POA: Insufficient documentation

## 2020-11-03 NOTE — Assessment & Plan Note (Signed)
Borderline, we will continue to monitor

## 2020-11-08 ENCOUNTER — Encounter: Payer: Self-pay | Admitting: Oncology

## 2020-11-09 ENCOUNTER — Other Ambulatory Visit: Payer: Self-pay

## 2020-11-09 ENCOUNTER — Inpatient Hospital Stay: Payer: Medicare Other

## 2020-11-09 ENCOUNTER — Encounter: Payer: Self-pay | Admitting: Oncology

## 2020-11-09 VITALS — BP 150/62 | HR 62 | Temp 97.9°F | Resp 18 | Ht 67.0 in | Wt 176.0 lb

## 2020-11-09 DIAGNOSIS — C61 Malignant neoplasm of prostate: Secondary | ICD-10-CM | POA: Diagnosis not present

## 2020-11-09 MED ORDER — LEUPROLIDE ACETATE (3 MONTH) 22.5 MG IM KIT
22.5000 mg | PACK | Freq: Once | INTRAMUSCULAR | Status: AC
  Administered 2020-11-09: 22.5 mg via INTRAMUSCULAR
  Filled 2020-11-09: qty 22.5

## 2020-11-09 NOTE — Patient Instructions (Signed)
Leuprolide injection What is this medication? LEUPROLIDE (loo PROE lide) is a man-made hormone. It is used to treat the symptoms of prostate cancer. This medicine may also be used to treat childrenwith early onset of puberty. It may be used for other hormonal conditions. This medicine may be used for other purposes; ask your health care provider orpharmacist if you have questions. COMMON BRAND NAME(S): Lupron What should I tell my care team before I take this medication? They need to know if you have any of these conditions: diabetes heart disease or previous heart attack high blood pressure high cholesterol pain or difficulty passing urine spinal cord metastasis stroke tobacco smoker an unusual or allergic reaction to leuprolide, benzyl alcohol, other medicines, foods, dyes, or preservatives pregnant or trying to get pregnant breast-feeding How should I use this medication? This medicine is for injection under the skin or into a muscle. You will be taught how to prepare and give this medicine. Use exactly as directed. Take your medicine at regular intervals. Do not take your medicine more often thandirected. It is important that you put your used needles and syringes in a special sharps container. Do not put them in a trash can. If you do not have a sharpscontainer, call your pharmacist or healthcare provider to get one. A special MedGuide will be given to you by the pharmacist with eachprescription and refill. Be sure to read this information carefully each time. Talk to your pediatrician regarding the use of this medicine in children. While this medicine may be prescribed for children as young as 8 years for selectedconditions, precautions do apply. Overdosage: If you think you have taken too much of this medicine contact apoison control center or emergency room at once. NOTE: This medicine is only for you. Do not share this medicine with others. What if I miss a dose? If you miss a  dose, take it as soon as you can. If it is almost time for yournext dose, take only that dose. Do not take double or extra doses. What may interact with this medication? Do not take this medicine with any of the following medications: chasteberry cisapride dronedarone pimozide thioridazine This medicine may also interact with the following medications: herbal or dietary supplements, like black cohosh or DHEA male hormones, like estrogens or progestins and birth control pills, patches, rings, or injections male hormones, like testosterone other medicines that prolong the QT interval (abnormal heart rhythm) This list may not describe all possible interactions. Give your health care provider a list of all the medicines, herbs, non-prescription drugs, or dietary supplements you use. Also tell them if you smoke, drink alcohol, or use illegaldrugs. Some items may interact with your medicine. What should I watch for while using this medication? Visit your doctor or health care professional for regular checks on your progress. During the first week, your symptoms may get worse, but then will improve as you continue your treatment. You may get hot flashes, increased bone pain, increased difficulty passing urine, or an aggravation of nerve symptoms. Discuss these effects with your doctor or health care professional, some ofthem may improve with continued use of this medicine. Male patients may experience a menstrual cycle or spotting during the first 2 months of therapy with this medicine. If this continues, contact your doctor orhealth care professional. This medicine may increase blood sugar. Ask your healthcare provider if changesin diet or medicines are needed if you have diabetes. What side effects may I notice from receiving this medication? Side   effects that you should report to your doctor or health care professionalas soon as possible: allergic reactions like skin rash, itching or hives,  swelling of the face, lips, or tongue breathing problems chest pain depression or memory disorders pain in your legs or groin pain at site where injected severe headache signs and symptoms of high blood sugar such as being more thirsty or hungry or having to urinate more than normal. You may also feel very tired or have blurry vision swelling of the feet and legs visual changes vomiting Side effects that usually do not require medical attention (report to yourdoctor or health care professional if they continue or are bothersome): breast swelling or tenderness decrease in sex drive or performance diarrhea hot flashes loss of appetite muscle, joint, or bone pains nausea redness or irritation at site where injected skin problems or acne This list may not describe all possible side effects. Call your doctor for medical advice about side effects. You may report side effects to FDA at1-800-FDA-1088. Where should I keep my medication? Keep out of the reach of children. Store below 25 degrees C (77 degrees F). Do not freeze. Protect from light. Do not use if it is not clear or if there are particles present. Throw away anyunused medicine after the expiration date. NOTE: This sheet is a summary. It may not cover all possible information. If you have questions about this medicine, talk to your doctor, pharmacist, orhealth care provider.  2022 Elsevier/Gold Standard (2019-01-28 10:57:41)  

## 2020-12-08 NOTE — Progress Notes (Signed)
[facility]  [address] [place],  [place]  [address] [phone]  Clinic Day:  12/16/2020  Referring physician: [name], MD  This document serves as a record of services personally performed by Mallery Harshman Macarthur Critchley, MD. It was created on their behalf by [facility], a trained medical scribe. The creation of this record is based on the scribe's personal observations and the provider's statements to them.  HISTORY OF PRESENT ILLNESS:  The patient is a 80 y.o. 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  He comes in today for routine followup.  He takes Lupron/apalutamide for his complete androgen blockade therapy, which he has tolerated without significant difficulty.  The patient denies having any bone pain or other symptoms which concern him for overt signs of  disease progression.    PHYSICAL EXAM:  Blood pressure (!) 184/79, pulse 65, temperature (!) 97.5 F (36.4 C), resp. rate 18, height 5\' 7"  (1.702 m), weight 173 lb 14.4 oz (78.9 kg), SpO2 99 %. Wt Readings from Last 3 Encounters:  12/16/20 173 lb 14.4 oz (78.9 kg)  11/09/20 176 lb 0.6 oz (79.9 kg)  11/02/20 178 lb 4.8 oz (80.9 kg)   Body mass index is 27.24 kg/m. Performance status (ECOG): 1 - Symptomatic but completely ambulatory GENERAL: The patient is alert and oriented x3, in no acute distress. HEENT EXAM: Clear oropharynx with no exudate or lesions appreciated. LUNG EXAM: Clear to auscultation bilaterally. CARDIAC EXAM: Regular rate and rhythm. No murmurs, rubs, or gallops. ABDOMINAL EXAM: Soft, nontender and nondistended; no hepatosplenomegaly; normal abdominal bowel sounds EXTREMITY EXAM: No clubbing, cyanosis, or edema. LYMPH NODE SURVEY: No palpable cervical, supraclavicular, axillary, or inguinal lymphadenopathy. NEUROLOGIC EXAM: Cranial nerves II-XII and cerebellar functions are grossly intact. SKIN EXAM: No petechiae, purpura or other abnormal skin  lesions are appreciated.  LABS:   Ref. Range 09/15/2020 10:14 12/15/2020 10:45  Prostate Specific Ag, Serum Latest Ref Range: 0.0 - 4.0 ng/mL 0.2 <0.1    Ref. Range 12/15/2020 00:00  Sodium Latest Ref Range: 137 - 147  140  Potassium Latest Ref Range: 3.4 - 5.3  4.4  Chloride Latest Ref Range: 99 - 108  104  CO2 Latest Ref Range: 13 - 22  29 (A)  Glucose Unknown 104  BUN Latest Ref Range: 4 - 21  21  Creatinine Latest Ref Range: 0.6 - 1.3  1.3  Calcium Latest Ref Range: 8.7 - 10.7  9.1  Alkaline Phosphatase Latest Ref Range: 25 - 125  76  Albumin Latest Ref Range: 3.5 - 5.0  4.2  AST Latest Ref Range: 14 - 40  30  ALT Latest Ref Range: 10 - 40  13  Bilirubin, Total Unknown 0.3  WBC Unknown 4.8  RBC Latest Ref Range: 3.87 - 5.11  3.61 (A)  Hemoglobin Latest Ref Range: 13.5 - 17.5  11.3 (A)  HCT Latest Ref Range: 41 - 53  35 (A)  Platelets Latest Ref Range: 150 - 399  160  NEUT# Unknown 3.12    ASSESSMENT & PLAN:  A 80 y.o. 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   I am very pleased as his PSA has become undetectable, which reflects the efficacy of his complete androgen blockade therapy.  He will continue taking Lupron/apalutamide for his disease management until there becomes evidence of his disease losing sensitivity to this regimen.  As he is doing well, I will see him back in 3 months for repeat clinical assessment.  The patient  understands all the plans discussed today and is in agreement with them.   I, Rita Ohara, am acting as scribe for Marice Potter, MD    I have reviewed this report as typed by the medical scribe, and it is complete and accurate.  Pearse Shiffler Macarthur Critchley, MD

## 2020-12-15 ENCOUNTER — Ambulatory Visit: Payer: Medicare Other | Admitting: Oncology

## 2020-12-15 ENCOUNTER — Other Ambulatory Visit: Payer: Self-pay

## 2020-12-15 ENCOUNTER — Inpatient Hospital Stay: Payer: Medicare Other | Attending: Hematology and Oncology

## 2020-12-15 DIAGNOSIS — C61 Malignant neoplasm of prostate: Secondary | ICD-10-CM | POA: Diagnosis present

## 2020-12-15 LAB — CBC: RBC: 3.61 — AB (ref 3.87–5.11)

## 2020-12-15 LAB — HEPATIC FUNCTION PANEL
ALT: 13 (ref 10–40)
AST: 30 (ref 14–40)
Alkaline Phosphatase: 76 (ref 25–125)
Bilirubin, Total: 0.3

## 2020-12-15 LAB — BASIC METABOLIC PANEL
BUN: 21 (ref 4–21)
CO2: 29 — AB (ref 13–22)
Chloride: 104 (ref 99–108)
Creatinine: 1.3 (ref 0.6–1.3)
Glucose: 104
Potassium: 4.4 (ref 3.4–5.3)
Sodium: 140 (ref 137–147)

## 2020-12-15 LAB — CBC AND DIFFERENTIAL
HCT: 35 — AB (ref 41–53)
Hemoglobin: 11.3 — AB (ref 13.5–17.5)
Neutrophils Absolute: 3.12
Platelets: 160 (ref 150–399)
WBC: 4.8

## 2020-12-15 LAB — COMPREHENSIVE METABOLIC PANEL
Albumin: 4.2 (ref 3.5–5.0)
Calcium: 9.1 (ref 8.7–10.7)

## 2020-12-16 ENCOUNTER — Encounter: Payer: Self-pay | Admitting: Oncology

## 2020-12-16 ENCOUNTER — Other Ambulatory Visit: Payer: Self-pay | Admitting: Oncology

## 2020-12-16 ENCOUNTER — Telehealth: Payer: Self-pay | Admitting: Oncology

## 2020-12-16 ENCOUNTER — Inpatient Hospital Stay (INDEPENDENT_AMBULATORY_CARE_PROVIDER_SITE_OTHER): Payer: Medicare Other | Admitting: Oncology

## 2020-12-16 VITALS — BP 184/79 | HR 65 | Temp 97.5°F | Resp 18 | Ht 67.0 in | Wt 173.9 lb

## 2020-12-16 DIAGNOSIS — C61 Malignant neoplasm of prostate: Secondary | ICD-10-CM

## 2020-12-16 LAB — PROSTATE-SPECIFIC AG, SERUM (LABCORP): Prostate Specific Ag, Serum: <0.1 ng/mL (ref 0.0–4.0)

## 2020-12-16 NOTE — Telephone Encounter (Signed)
Per 10/7 LOS, patient scheduled for Nov/March Appt's.  Gave patient Appt Summary

## 2020-12-20 ENCOUNTER — Encounter: Payer: Self-pay | Admitting: Oncology

## 2021-01-16 NOTE — Progress Notes (Signed)
Patient called, he received a letter from [facility] letting him know that starting 03/12/2021 they will no longer offer the free Erleada. They referred me to [facility], who stated that they will not offer the free Erleada either. The only option would be open co-pay funds. I let patient know and that I would search for co-pay funds when it got closer to time. If there are no open funds and the medication is to expensive I let them know we will have to speak with Dr. Bobby Rumpf about next steps. He will receive free medication until 03/11/2021.

## 2021-01-26 ENCOUNTER — Encounter: Payer: Self-pay | Admitting: Oncology

## 2021-02-09 ENCOUNTER — Inpatient Hospital Stay: Payer: Medicare Other | Attending: Hematology and Oncology

## 2021-02-09 ENCOUNTER — Other Ambulatory Visit: Payer: Self-pay

## 2021-02-09 VITALS — BP 120/89 | HR 47 | Temp 97.9°F | Resp 18 | Ht 67.0 in | Wt 174.0 lb

## 2021-02-09 DIAGNOSIS — Z79818 Long term (current) use of other agents affecting estrogen receptors and estrogen levels: Secondary | ICD-10-CM | POA: Insufficient documentation

## 2021-02-09 DIAGNOSIS — C61 Malignant neoplasm of prostate: Secondary | ICD-10-CM | POA: Insufficient documentation

## 2021-02-09 MED ORDER — LEUPROLIDE ACETATE (3 MONTH) 22.5 MG IM KIT
22.5000 mg | PACK | Freq: Once | INTRAMUSCULAR | Status: AC
  Administered 2021-02-09: 22.5 mg via INTRAMUSCULAR
  Filled 2021-02-09: qty 22.5

## 2021-02-09 NOTE — Patient Instructions (Signed)
Leuprolide injection What is this medication? LEUPROLIDE (loo PROE lide) is a man-made hormone. It is used to treat the symptoms of prostate cancer. This medicine may also be used to treat children with early onset of puberty. It may be used for other hormonal conditions. This medicine may be used for other purposes; ask your health care provider or pharmacist if you have questions. COMMON BRAND NAME(S): Lupron What should I tell my care team before I take this medication? They need to know if you have any of these conditions: diabetes heart disease or previous heart attack high blood pressure high cholesterol pain or difficulty passing urine spinal cord metastasis stroke tobacco smoker an unusual or allergic reaction to leuprolide, benzyl alcohol, other medicines, foods, dyes, or preservatives pregnant or trying to get pregnant breast-feeding How should I use this medication? This medicine is for injection under the skin or into a muscle. You will be taught how to prepare and give this medicine. Use exactly as directed. Take your medicine at regular intervals. Do not take your medicine more often than directed. It is important that you put your used needles and syringes in a special sharps container. Do not put them in a trash can. If you do not have a sharps container, call your pharmacist or healthcare provider to get one. A special MedGuide will be given to you by the pharmacist with each prescription and refill. Be sure to read this information carefully each time. Talk to your pediatrician regarding the use of this medicine in children. While this medicine may be prescribed for children as young as 8 years for selected conditions, precautions do apply. Overdosage: If you think you have taken too much of this medicine contact a poison control center or emergency room at once. NOTE: This medicine is only for you. Do not share this medicine with others. What if I miss a dose? If you miss  a dose, take it as soon as you can. If it is almost time for your next dose, take only that dose. Do not take double or extra doses. What may interact with this medication? Do not take this medicine with any of the following medications: chasteberry cisapride dronedarone pimozide thioridazine This medicine may also interact with the following medications: herbal or dietary supplements, like black cohosh or DHEA male hormones, like estrogens or progestins and birth control pills, patches, rings, or injections male hormones, like testosterone other medicines that prolong the QT interval (abnormal heart rhythm) This list may not describe all possible interactions. Give your health care provider a list of all the medicines, herbs, non-prescription drugs, or dietary supplements you use. Also tell them if you smoke, drink alcohol, or use illegal drugs. Some items may interact with your medicine. What should I watch for while using this medication? Visit your doctor or health care professional for regular checks on your progress. During the first week, your symptoms may get worse, but then will improve as you continue your treatment. You may get hot flashes, increased bone pain, increased difficulty passing urine, or an aggravation of nerve symptoms. Discuss these effects with your doctor or health care professional, some of them may improve with continued use of this medicine. Male patients may experience a menstrual cycle or spotting during the first 2 months of therapy with this medicine. If this continues, contact your doctor or health care professional. This medicine may increase blood sugar. Ask your healthcare provider if changes in diet or medicines are needed if you have  diabetes. What side effects may I notice from receiving this medication? Side effects that you should report to your doctor or health care professional as soon as possible: allergic reactions like skin rash, itching or  hives, swelling of the face, lips, or tongue breathing problems chest pain depression or memory disorders pain in your legs or groin pain at site where injected severe headache signs and symptoms of high blood sugar such as being more thirsty or hungry or having to urinate more than normal. You may also feel very tired or have blurry vision swelling of the feet and legs visual changes vomiting Side effects that usually do not require medical attention (report to your doctor or health care professional if they continue or are bothersome): breast swelling or tenderness decrease in sex drive or performance diarrhea hot flashes loss of appetite muscle, joint, or bone pains nausea redness or irritation at site where injected skin problems or acne This list may not describe all possible side effects. Call your doctor for medical advice about side effects. You may report side effects to FDA at 1-800-FDA-1088. Where should I keep my medication? Keep out of the reach of children. Store below 25 degrees C (77 degrees F). Do not freeze. Protect from light. Do not use if it is not clear or if there are particles present. Throw away any unused medicine after the expiration date. NOTE: This sheet is a summary. It may not cover all possible information. If you have questions about this medicine, talk to your doctor, pharmacist, or health care provider.  2022 Elsevier/Gold Standard (2020-11-15 00:00:00)

## 2021-03-28 ENCOUNTER — Other Ambulatory Visit (HOSPITAL_COMMUNITY): Payer: Self-pay

## 2021-03-28 ENCOUNTER — Telehealth: Payer: Self-pay | Admitting: Pharmacy Technician

## 2021-03-28 ENCOUNTER — Encounter: Payer: Self-pay | Admitting: Oncology

## 2021-03-28 NOTE — Telephone Encounter (Signed)
Submitted a Prior Authorization request to [facility] for  Erleada 60mg   via CoverMyMeds. Will update once we receive a response.   (Key[place]ivia [place]) - [id number]

## 2021-03-28 NOTE — Telephone Encounter (Signed)
Oral Oncology Patient Advocate Encounter  Prior Authorization for Erleada 60mg  has been approved.    PA# [id number] Effective dates: 03/12/21 through 03/11/22  Patients co-pay is $3,255.89  Oral Oncology Clinic will continue to follow.

## 2021-03-28 NOTE — Telephone Encounter (Addendum)
Patient has been approved for $3,500 Prostate copay grant through [employer]. Approved through 03/28/21 through 03/29/22.   Processing information:  Cardholder: [id number] BIN: [id number] PCN: [id number] Group: [id number]   Phone: [phone]

## 2021-03-29 ENCOUNTER — Encounter: Payer: Self-pay | Admitting: Oncology

## 2021-03-29 ENCOUNTER — Other Ambulatory Visit (HOSPITAL_COMMUNITY): Payer: Self-pay

## 2021-03-29 ENCOUNTER — Other Ambulatory Visit: Payer: Self-pay | Admitting: Oncology

## 2021-03-29 MED ORDER — ERLEADA 60 MG PO TABS
240.0000 mg | ORAL_TABLET | Freq: Every day | ORAL | 5 refills | Status: DC
  Filled 2021-03-29 – 2021-03-30 (×2): qty 120, 30d supply, fill #0
  Filled 2021-04-24: qty 120, 30d supply, fill #1
  Filled 2021-05-23: qty 120, 30d supply, fill #2

## 2021-03-29 NOTE — Telephone Encounter (Signed)
Patient has been approved for $8,000 Prostate copay grant through [facility]. Approved through 02/27/21 through 02/26/22.   Processing information:  CARD NO. [id number]   BIN [id number]   PCN [id number]   PC GROUP [id number]   HELP DESK [phone]

## 2021-03-30 ENCOUNTER — Other Ambulatory Visit (HOSPITAL_COMMUNITY): Payer: Self-pay

## 2021-03-30 NOTE — Telephone Encounter (Signed)
Patient's 1st shipment from [facility] scheduled to deliver on 04/04/21

## 2021-04-03 ENCOUNTER — Other Ambulatory Visit (HOSPITAL_COMMUNITY): Payer: Self-pay

## 2021-04-10 ENCOUNTER — Other Ambulatory Visit (HOSPITAL_COMMUNITY): Payer: Self-pay

## 2021-04-12 DEATH — deceased

## 2021-04-24 ENCOUNTER — Other Ambulatory Visit (HOSPITAL_COMMUNITY): Payer: Self-pay

## 2021-05-02 ENCOUNTER — Other Ambulatory Visit (HOSPITAL_COMMUNITY): Payer: Self-pay

## 2021-05-04 ENCOUNTER — Other Ambulatory Visit (HOSPITAL_COMMUNITY): Payer: Self-pay

## 2021-05-08 NOTE — Progress Notes (Signed)
?[facility]  ?[address] ?[place],  [place]  [address] ?[phone] ? ?Clinic Day:  05/12/2021 ? ?Referring physician: [name], MD ? ?This document serves as a record of services personally performed by Marice Potter, MD. It was created on their behalf by Curry,Lauren E, a trained medical scribe. The creation of this record is based on the scribe's personal observations and the provider's statements to them. ? ?HISTORY OF PRESENT ILLNESS:  ?The patient is a 81 y.o. 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  He comes in today for routine followup.  He takes Lupron/apalutamide for his complete androgen blockade therapy, which he has tolerated without any significant difficulty.  The patient denies having any bone pain or other symptoms which concern him for overt signs of  disease progression.   ? ?PHYSICAL EXAM:  ?Blood pressure (!) 188/76, pulse 66, temperature 97.9 ?F (36.6 ?C), resp. rate 16, height 5\' 7"  (1.702 m), weight 173 lb (78.5 kg), SpO2 96 %. ?Wt Readings from Last 3 Encounters:  ?05/12/21 173 lb (78.5 kg)  ?05/12/21 173 lb (78.5 kg)  ?02/09/21 174 lb (78.9 kg)  ? ?Body mass index is 27.1 kg/m?Marland Kitchen ?Performance status (ECOG): 1 - Symptomatic but completely ambulatory ?GENERAL: The patient is alert and oriented x3, in no acute distress. ?HEENT EXAM: Clear oropharynx with no exudate or lesions appreciated. ?LUNG EXAM: Clear to auscultation bilaterally. ?CARDIAC EXAM: Regular rate and rhythm. No murmurs, rubs, or gallops. ?ABDOMINAL EXAM: Soft, nontender and nondistended; no hepatosplenomegaly; normal abdominal bowel sounds ?EXTREMITY EXAM: No clubbing, cyanosis, or edema. ?LYMPH NODE SURVEY: No palpable cervical, supraclavicular, axillary, or inguinal lymphadenopathy. ?NEUROLOGIC EXAM: Cranial nerves II-XII and cerebellar functions are grossly intact. ?SKIN EXAM: No petechiae, purpura or other abnormal skin lesions are  appreciated. ? ?LABS: ? ? Latest Reference Range & Units 09/15/20 10:14 12/15/20 10:45 05/11/21 10:33  ?Prostate Specific Ag, Serum 0.0 - 4.0 ng/mL 0.2 <0.1   ?Prostatic Specific Antigen 0.00 - 4.00 ng/mL   0.04  ? ? ? Latest Reference Range & Units 05/11/21 00:00  ?Sodium 137 - 147  141 (E)  ?Potassium 3.4 - 5.3  4.4 (E)  ?Chloride 99 - 108  107 (E)  ?CO2 13 - 22  26 ! (E)  ?Glucose  98 (E)  ?BUN 4 - 21  27 ! (E)  ?Creatinine 0.6 - 1.3  1.4 ! (E)  ?Calcium 8.7 - 10.7  9.2 (E)  ?Alkaline Phosphatase 25 - 125  84 (E)  ?Albumin 3.5 - 5.0  4.1 (E)  ?AST 14 - 40  35 (E)  ?ALT 10 - 40  22 (E)  ?Bilirubin, Total  0.3 (E)  ?WBC  5.6 (E)  ?RBC 3.87 - 5.11  3.73 ! (E)  ?Hemoglobin 13.5 - 17.5  11.8 ! (E)  ?HCT 41 - 53  36 ! (E)  ?MCV 80 - 94  95 ! (E)  ?Platelets 150 - 399  188 (E)  ?NEUT#  3.75 (E)  ? ? ?ASSESSMENT & PLAN:  ?A 81 y.o. 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   I am very pleased as his PSA .04 is consistent with being undetectable (<0.1), which reflects the continued efficacy of his complete androgen blockade therapy.  He will continue taking Lupron/apalutamide for his disease management until there becomes evidence of his disease losing sensitivity to this regimen.  As he is doing well, I will see him back in 3 months for repeat clinical assessment.  The patient  understands all the plans discussed today and is in agreement with them. ? ? ?I, Rita Ohara, am acting as scribe for Marice Potter, MD   ? ?I have reviewed this report as typed by the medical scribe, and it is complete and accurate. ? ?Haily Caley Macarthur Critchley, MD ? ? ? ?  ?

## 2021-05-11 ENCOUNTER — Other Ambulatory Visit: Payer: Self-pay | Admitting: Hematology and Oncology

## 2021-05-11 ENCOUNTER — Other Ambulatory Visit: Payer: Self-pay

## 2021-05-11 ENCOUNTER — Inpatient Hospital Stay: Payer: Medicare Other | Attending: Hematology and Oncology

## 2021-05-11 DIAGNOSIS — C61 Malignant neoplasm of prostate: Secondary | ICD-10-CM | POA: Diagnosis not present

## 2021-05-11 DIAGNOSIS — Z79818 Long term (current) use of other agents affecting estrogen receptors and estrogen levels: Secondary | ICD-10-CM | POA: Insufficient documentation

## 2021-05-11 LAB — CBC
MCV: 95 — AB (ref 80–94)
RBC: 3.73 — AB (ref 3.87–5.11)

## 2021-05-11 LAB — CBC AND DIFFERENTIAL
HCT: 36 — AB (ref 41–53)
Hemoglobin: 11.8 — AB (ref 13.5–17.5)
Neutrophils Absolute: 3.75
Platelets: 188 (ref 150–399)
WBC: 5.6

## 2021-05-11 LAB — BASIC METABOLIC PANEL
BUN: 27 — AB (ref 4–21)
CO2: 26 — AB (ref 13–22)
Chloride: 107 (ref 99–108)
Creatinine: 1.4 — AB (ref 0.6–1.3)
Glucose: 98
Potassium: 4.4 (ref 3.4–5.3)
Sodium: 141 (ref 137–147)

## 2021-05-11 LAB — HEPATIC FUNCTION PANEL
ALT: 22 (ref 10–40)
AST: 35 (ref 14–40)
Alkaline Phosphatase: 84 (ref 25–125)
Bilirubin, Total: 0.3

## 2021-05-11 LAB — COMPREHENSIVE METABOLIC PANEL
Albumin: 4.1 (ref 3.5–5.0)
Calcium: 9.2 (ref 8.7–10.7)

## 2021-05-11 LAB — PSA: Prostatic Specific Antigen: 0.04 ng/mL (ref 0.00–4.00)

## 2021-05-12 ENCOUNTER — Inpatient Hospital Stay (INDEPENDENT_AMBULATORY_CARE_PROVIDER_SITE_OTHER): Payer: Medicare Other | Admitting: Oncology

## 2021-05-12 ENCOUNTER — Inpatient Hospital Stay: Payer: Medicare Other

## 2021-05-12 VITALS — BP 188/76 | HR 66 | Temp 97.9°F | Resp 16 | Ht 67.0 in | Wt 173.0 lb

## 2021-05-12 VITALS — BP 175/63 | HR 57 | Temp 97.9°F | Resp 18 | Ht 67.0 in | Wt 173.0 lb

## 2021-05-12 DIAGNOSIS — C61 Malignant neoplasm of prostate: Secondary | ICD-10-CM

## 2021-05-12 MED ORDER — LEUPROLIDE ACETATE (3 MONTH) 22.5 MG IM KIT
22.5000 mg | PACK | Freq: Once | INTRAMUSCULAR | Status: AC
  Administered 2021-05-12: 22.5 mg via INTRAMUSCULAR
  Filled 2021-05-12: qty 22.5

## 2021-05-12 NOTE — Patient Instructions (Signed)
Leuprolide injection ?What is this medication? ?LEUPROLIDE (loo PROE lide) is a man-made hormone. It is used to treat the symptoms of prostate cancer. This medicine may also be used to treat children with early onset of puberty. It may be used for other hormonal conditions. ?This medicine may be used for other purposes; ask your health care provider or pharmacist if you have questions. ?COMMON BRAND NAME(S): Lupron ?What should I tell my care team before I take this medication? ?They need to know if you have any of these conditions: ?diabetes ?heart disease or previous heart attack ?high blood pressure ?high cholesterol ?pain or difficulty passing urine ?spinal cord metastasis ?stroke ?tobacco smoker ?an unusual or allergic reaction to leuprolide, benzyl alcohol, other medicines, foods, dyes, or preservatives ?pregnant or trying to get pregnant ?breast-feeding ?How should I use this medication? ?This medicine is for injection under the skin or into a muscle. You will be taught how to prepare and give this medicine. Use exactly as directed. Take your medicine at regular intervals. Do not take your medicine more often than directed. ?It is important that you put your used needles and syringes in a special sharps container. Do not put them in a trash can. If you do not have a sharps container, call your pharmacist or healthcare provider to get one. ?A special MedGuide will be given to you by the pharmacist with each prescription and refill. Be sure to read this information carefully each time. ?Talk to your pediatrician regarding the use of this medicine in children. While this medicine may be prescribed for children as young as 8 years for selected conditions, precautions do apply. ?Overdosage: If you think you have taken too much of this medicine contact a poison control center or emergency room at once. ?NOTE: This medicine is only for you. Do not share this medicine with others. ?What if I miss a dose? ?If you miss  a dose, take it as soon as you can. If it is almost time for your next dose, take only that dose. Do not take double or extra doses. ?What may interact with this medication? ?Do not take this medicine with any of the following medications: ?chasteberry ?cisapride ?dronedarone ?pimozide ?thioridazine ?This medicine may also interact with the following medications: ?herbal or dietary supplements, like black cohosh or DHEA ?male hormones, like estrogens or progestins and birth control pills, patches, rings, or injections ?male hormones, like testosterone ?other medicines that prolong the QT interval (abnormal heart rhythm) ?This list may not describe all possible interactions. Give your health care provider a list of all the medicines, herbs, non-prescription drugs, or dietary supplements you use. Also tell them if you smoke, drink alcohol, or use illegal drugs. Some items may interact with your medicine. ?What should I watch for while using this medication? ?Visit your doctor or health care professional for regular checks on your progress. During the first week, your symptoms may get worse, but then will improve as you continue your treatment. You may get hot flashes, increased bone pain, increased difficulty passing urine, or an aggravation of nerve symptoms. Discuss these effects with your doctor or health care professional, some of them may improve with continued use of this medicine. ?Male patients may experience a menstrual cycle or spotting during the first 2 months of therapy with this medicine. If this continues, contact your doctor or health care professional. ?This medicine may increase blood sugar. Ask your healthcare provider if changes in diet or medicines are needed if you have  diabetes. ?What side effects may I notice from receiving this medication? ?Side effects that you should report to your doctor or health care professional as soon as possible: ?allergic reactions like skin rash, itching or  hives, swelling of the face, lips, or tongue ?breathing problems ?chest pain ?depression or memory disorders ?pain in your legs or groin ?pain at site where injected ?severe headache ?signs and symptoms of high blood sugar such as being more thirsty or hungry or having to urinate more than normal. You may also feel very tired or have blurry vision ?swelling of the feet and legs ?visual changes ?vomiting ?Side effects that usually do not require medical attention (report to your doctor or health care professional if they continue or are bothersome): ?breast swelling or tenderness ?decrease in sex drive or performance ?diarrhea ?hot flashes ?loss of appetite ?muscle, joint, or bone pains ?nausea ?redness or irritation at site where injected ?skin problems or acne ?This list may not describe all possible side effects. Call your doctor for medical advice about side effects. You may report side effects to FDA at 1-800-FDA-1088. ?Where should I keep my medication? ?Keep out of the reach of children. ?Store below 25 degrees C (77 degrees F). Do not freeze. Protect from light. Do not use if it is not clear or if there are particles present. Throw away any unused medicine after the expiration date. ?NOTE: This sheet is a summary. It may not cover all possible information. If you have questions about this medicine, talk to your doctor, pharmacist, or health care provider. ?? 2022 Elsevier/Gold Standard (2020-11-15 00:00:00) ? ?

## 2021-05-13 ENCOUNTER — Encounter: Payer: Self-pay | Admitting: Oncology

## 2021-05-23 ENCOUNTER — Other Ambulatory Visit (HOSPITAL_COMMUNITY): Payer: Self-pay

## 2021-06-02 ENCOUNTER — Other Ambulatory Visit (HOSPITAL_COMMUNITY): Payer: Self-pay

## 2021-06-06 ENCOUNTER — Other Ambulatory Visit: Payer: Self-pay

## 2021-06-06 DIAGNOSIS — R55 Syncope and collapse: Secondary | ICD-10-CM

## 2021-06-15 NOTE — Progress Notes (Signed)
?  [facility]  ?[address] ?[place],  [place]  [address] ?[phone] ? ?Clinic Day:  06/16/2021  ? ?Referring physician: [name], MD ? ?HISTORY OF PRESENT ILLNESS:  ?The patient is a 81 y.o. 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  He comes in today after being recently discharged from [facility] with a hypertensive crisis.  During his work-up there, physicians thought his apalutamide therapy for his prostate cancer was factoring into his hypertension.  Based upon this, he was told to discontinue this medicine.  Since stopping this medicine, the patient claims to be feeling better.  He claims he was having periodic headaches while on apalutamide.  Up until recently, he was taking currently blockade therapy, which consisted Lupron/apalutamide.  As it pertains to his metastatic prostate cancer, the patient denies having any bone pain or other symptoms which concern him for the possibility of metastatic disease. ? ?PHYSICAL EXAM:  ?Blood pressure 135/67, pulse 80, temperature 98 ?F (36.7 ?C), resp. rate 16, height '5\' 7"'$  (1.702 m), weight 169 lb 12.8 oz (77 kg), SpO2 93 %. ?Wt Readings from Last 3 Encounters:  ?06/16/21 169 lb 12.8 oz (77 kg)  ?05/12/21 173 lb (78.5 kg)  ?05/12/21 173 lb (78.5 kg)  ? ?Body mass index is 26.59 kg/m?Marland Kitchen ?Performance status (ECOG): 1 - Symptomatic but completely ambulatory ?GENERAL: The patient is alert and oriented x3, in no acute distress. ?HEENT EXAM: Clear oropharynx with no exudate or lesions appreciated. ?LUNG EXAM: Clear to auscultation bilaterally. ?CARDIAC EXAM: Regular rate and rhythm. No murmurs, rubs, or gallops. ?ABDOMINAL EXAM: Soft, nontender and nondistended; no hepatosplenomegaly; normal abdominal bowel sounds ?EXTREMITY EXAM: No clubbing, cyanosis, or edema. ?LYMPH NODE SURVEY: No palpable cervical, supraclavicular, axillary, or inguinal lymphadenopathy. ?NEUROLOGIC EXAM: Cranial nerves II-XII and  cerebellar functions are grossly intact. ?SKIN EXAM: No petechiae, purpura or other abnormal skin lesions are appreciated. ? ?LABS: ? Latest Reference Range & Units 09/15/20 10:14 12/15/20 10:45 05/11/21 10:33  ?Prostate Specific Ag, Serum 0.0 - 4.0 ng/mL 0.2 <0.1   ?Prostatic Specific Antigen 0.00 - 4.00 ng/mL   0.04  ? ?ASSESSMENT & PLAN:  ?A 81 y.o. 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  As mentioned previously, this gentleman had his apalutamide medicine discontinued when he was recently hospitalized for hypertensive crisis.  Although the patient attributes his hypertension to his apalutamide therapy, I reminded the patient that his blood pressure at his first visit to our center registered at 233/114.  This severely elevated blood pressure was recorded even before his apalutamide therapy was started.  Furthermore, when looking at additional notes, this gentleman does have longstanding hypertension, as well as heart disease.  I personally do not believe his apalutamide therapy is behind his hypertension.  In fact, his blood pressure today is elevated, and this is with him being off apalutamide for numerous weeks.  Although he does have castrate refractory disease, I will hold his apalutamide therapy for now.  The patient understands that if his PSA begins to rise, giving him some form of antiandrogen to re-establish complete androgen blockade therapy will likely be necessary.  He already has a follow-up appointment with me in June 2023, which he knows to keep.  The patient and his wife understand all the plans discussed today and are in agreement with them. ? ?Shelby Anderle Macarthur Critchley, MD ? ? ? ?  ?

## 2021-06-16 ENCOUNTER — Inpatient Hospital Stay: Payer: Medicare Other

## 2021-06-16 ENCOUNTER — Inpatient Hospital Stay: Payer: Medicare Other | Attending: Hematology and Oncology | Admitting: Oncology

## 2021-06-16 VITALS — BP 135/67 | HR 80 | Temp 98.0°F | Resp 16 | Ht 67.0 in | Wt 169.8 lb

## 2021-06-16 DIAGNOSIS — C61 Malignant neoplasm of prostate: Secondary | ICD-10-CM

## 2021-06-16 DIAGNOSIS — I10 Essential (primary) hypertension: Secondary | ICD-10-CM | POA: Insufficient documentation

## 2021-06-16 LAB — CBC AND DIFFERENTIAL
HCT: 33 — AB (ref 41–53)
Hemoglobin: 10.8 — AB (ref 13.5–17.5)
Neutrophils Absolute: 2.79
Platelets: 192 10*3/uL (ref 150–400)
WBC: 4.5

## 2021-06-16 LAB — HEPATIC FUNCTION PANEL
ALT: 21 U/L (ref 10–40)
AST: 36 (ref 14–40)
Alkaline Phosphatase: 77 (ref 25–125)
Bilirubin, Total: 0.5

## 2021-06-16 LAB — PSA: Prostatic Specific Antigen: <0.01 ng/mL (ref 0.00–4.00)

## 2021-06-16 LAB — COMPREHENSIVE METABOLIC PANEL
Albumin: 4.1 (ref 3.5–5.0)
Calcium: 9.5 (ref 8.7–10.7)

## 2021-06-16 LAB — BASIC METABOLIC PANEL
BUN: 27 — AB (ref 4–21)
CO2: 26 — AB (ref 13–22)
Chloride: 102 (ref 99–108)
Creatinine: 1.3 (ref 0.6–1.3)
Glucose: 101
Potassium: 4.4 mEq/L (ref 3.5–5.1)
Sodium: 139 (ref 137–147)

## 2021-06-16 LAB — CBC: RBC: 3.52 — AB (ref 3.87–5.11)

## 2021-06-26 ENCOUNTER — Other Ambulatory Visit (HOSPITAL_COMMUNITY): Payer: Self-pay

## 2021-08-10 ENCOUNTER — Other Ambulatory Visit: Payer: Self-pay | Admitting: Pharmacist

## 2021-08-10 ENCOUNTER — Inpatient Hospital Stay: Payer: Medicare Other | Attending: Hematology and Oncology

## 2021-08-10 DIAGNOSIS — C61 Malignant neoplasm of prostate: Secondary | ICD-10-CM | POA: Insufficient documentation

## 2021-08-10 DIAGNOSIS — Z79818 Long term (current) use of other agents affecting estrogen receptors and estrogen levels: Secondary | ICD-10-CM | POA: Diagnosis not present

## 2021-08-10 LAB — CBC AND DIFFERENTIAL
HCT: 33 — AB (ref 41–53)
Hemoglobin: 10.4 — AB (ref 13.5–17.5)
Neutrophils Absolute: 4.29
Platelets: 194 10*3/uL (ref 150–400)
WBC: 6.5

## 2021-08-10 LAB — HEPATIC FUNCTION PANEL
ALT: 17 U/L (ref 10–40)
AST: 31 (ref 14–40)
Alkaline Phosphatase: 86 (ref 25–125)
Bilirubin, Total: 0.4

## 2021-08-10 LAB — BASIC METABOLIC PANEL
BUN: 24 — AB (ref 4–21)
CO2: 25 — AB (ref 13–22)
Chloride: 104 (ref 99–108)
Creatinine: 1.2 (ref 0.6–1.3)
Glucose: 102
Potassium: 4.7 mEq/L (ref 3.5–5.1)
Sodium: 140 (ref 137–147)

## 2021-08-10 LAB — COMPREHENSIVE METABOLIC PANEL
Albumin: 4.3 (ref 3.5–5.0)
Calcium: 9.3 (ref 8.7–10.7)

## 2021-08-10 LAB — CBC: RBC: 3.7 — AB (ref 3.87–5.11)

## 2021-08-10 LAB — PSA: Prostatic Specific Antigen: 0.01 ng/mL (ref 0.00–4.00)

## 2021-08-11 ENCOUNTER — Other Ambulatory Visit: Payer: Self-pay | Admitting: Oncology

## 2021-08-11 ENCOUNTER — Inpatient Hospital Stay: Payer: Medicare Other | Admitting: Oncology

## 2021-08-11 ENCOUNTER — Encounter: Payer: Self-pay | Admitting: Oncology

## 2021-08-11 ENCOUNTER — Inpatient Hospital Stay: Payer: Medicare Other

## 2021-08-11 VITALS — BP 162/84 | HR 93 | Temp 97.6°F | Resp 18 | Wt 170.1 lb

## 2021-08-11 VITALS — BP 166/71 | HR 80 | Temp 98.1°F | Resp 18 | Ht 67.0 in | Wt 172.1 lb

## 2021-08-11 DIAGNOSIS — C61 Malignant neoplasm of prostate: Secondary | ICD-10-CM | POA: Diagnosis not present

## 2021-08-11 MED ORDER — LEUPROLIDE ACETATE (3 MONTH) 22.5 MG IM KIT
22.5000 mg | PACK | Freq: Once | INTRAMUSCULAR | Status: AC
  Administered 2021-08-11: 22.5 mg via INTRAMUSCULAR
  Filled 2021-08-11: qty 22.5

## 2021-08-11 NOTE — Patient Instructions (Signed)
Leuprolide injection What is this medication? LEUPROLIDE (loo PROE lide) is a man-made hormone. It is used to treat the symptoms of prostate cancer. This medicine may also be used to treat children with early onset of puberty. It may be used for other hormonal conditions. This medicine may be used for other purposes; ask your health care provider or pharmacist if you have questions. COMMON BRAND NAME(S): Lupron What should I tell my care team before I take this medication? They need to know if you have any of these conditions: diabetes heart disease or previous heart attack high blood pressure high cholesterol pain or difficulty passing urine spinal cord metastasis stroke tobacco smoker an unusual or allergic reaction to leuprolide, benzyl alcohol, other medicines, foods, dyes, or preservatives pregnant or trying to get pregnant breast-feeding How should I use this medication? This medicine is for injection under the skin or into a muscle. You will be taught how to prepare and give this medicine. Use exactly as directed. Take your medicine at regular intervals. Do not take your medicine more often than directed. It is important that you put your used needles and syringes in a special sharps container. Do not put them in a trash can. If you do not have a sharps container, call your pharmacist or healthcare provider to get one. A special MedGuide will be given to you by the pharmacist with each prescription and refill. Be sure to read this information carefully each time. Talk to your pediatrician regarding the use of this medicine in children. While this medicine may be prescribed for children as young as 8 years for selected conditions, precautions do apply. Overdosage: If you think you have taken too much of this medicine contact a poison control center or emergency room at once. NOTE: This medicine is only for you. Do not share this medicine with others. What if I miss a dose? If you miss  a dose, take it as soon as you can. If it is almost time for your next dose, take only that dose. Do not take double or extra doses. What may interact with this medication? Do not take this medicine with any of the following medications: chasteberry cisapride dronedarone pimozide thioridazine This medicine may also interact with the following medications: herbal or dietary supplements, like black cohosh or DHEA male hormones, like estrogens or progestins and birth control pills, patches, rings, or injections male hormones, like testosterone other medicines that prolong the QT interval (abnormal heart rhythm) This list may not describe all possible interactions. Give your health care provider a list of all the medicines, herbs, non-prescription drugs, or dietary supplements you use. Also tell them if you smoke, drink alcohol, or use illegal drugs. Some items may interact with your medicine. What should I watch for while using this medication? Visit your doctor or health care professional for regular checks on your progress. During the first week, your symptoms may get worse, but then will improve as you continue your treatment. You may get hot flashes, increased bone pain, increased difficulty passing urine, or an aggravation of nerve symptoms. Discuss these effects with your doctor or health care professional, some of them may improve with continued use of this medicine. Male patients may experience a menstrual cycle or spotting during the first 2 months of therapy with this medicine. If this continues, contact your doctor or health care professional. This medicine may increase blood sugar. Ask your healthcare provider if changes in diet or medicines are needed if you have  diabetes. What side effects may I notice from receiving this medication? Side effects that you should report to your doctor or health care professional as soon as possible: allergic reactions like skin rash, itching or  hives, swelling of the face, lips, or tongue breathing problems chest pain depression or memory disorders pain in your legs or groin pain at site where injected severe headache signs and symptoms of high blood sugar such as being more thirsty or hungry or having to urinate more than normal. You may also feel very tired or have blurry vision swelling of the feet and legs visual changes vomiting Side effects that usually do not require medical attention (report to your doctor or health care professional if they continue or are bothersome): breast swelling or tenderness decrease in sex drive or performance diarrhea hot flashes loss of appetite muscle, joint, or bone pains nausea redness or irritation at site where injected skin problems or acne This list may not describe all possible side effects. Call your doctor for medical advice about side effects. You may report side effects to FDA at 1-800-FDA-1088. Where should I keep my medication? Keep out of the reach of children. Store below 25 degrees C (77 degrees F). Do not freeze. Protect from light. Do not use if it is not clear or if there are particles present. Throw away any unused medicine after the expiration date. NOTE: This sheet is a summary. It may not cover all possible information. If you have questions about this medicine, talk to your doctor, pharmacist, or health care provider.  2023 [employer] Standard (2021-01-27 00:00:00)  

## 2021-08-11 NOTE — Progress Notes (Signed)
  Kevin Burgess  [address] [place],  [place]  [address] [phone]  Clinic Day:  08/11/2021   Referring physician: [name], MD  HISTORY OF PRESENT ILLNESS:  The patient is a 81 y.o. 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  He currently takes Lupron for his disease management.  In the past, he was also on apalutamide.  However, this medication was discontinued due to it affecting his blood pressure and him having side effects from this agent.  He comes in today for routine follow-up.  Since his last visit, the patient claims to be doing well.  As it pertains to his prostate cancer, the patient denies having any bone pain or other symptoms which concern him for the possibility of having metastatic disease.  PHYSICAL EXAM:  Blood pressure (!) 166/71, pulse 80, temperature 98.1 F (36.7 C), temperature source Oral, resp. rate 18, height '5\' 7"'$  (1.702 m), weight 172 lb 1.6 oz (78.1 kg), SpO2 96 %. Wt Readings from Last 3 Encounters:  08/11/21 170 lb 1.3 oz (77.1 kg)  08/11/21 172 lb 1.6 oz (78.1 kg)  06/16/21 169 lb 12.8 oz (77 kg)   Body mass index is 26.95 kg/m.  Performance status (ECOG): 1 - Symptomatic but completely ambulatory GENERAL: The patient is alert and oriented x3, in no acute distress. HEENT EXAM: Clear oropharynx with no exudate or lesions appreciated. LUNG EXAM: Clear to auscultation bilaterally. CARDIAC EXAM: Regular rate and rhythm. No murmurs, rubs, or gallops. ABDOMINAL EXAM: Soft, nontender and nondistended; no hepatosplenomegaly; normal abdominal bowel sounds EXTREMITY EXAM: No clubbing, cyanosis, or edema. LYMPH NODE SURVEY: No palpable cervical, supraclavicular, axillary, or inguinal lymphadenopathy. NEUROLOGIC EXAM: Cranial nerves II-XII and cerebellar functions are grossly intact. SKIN EXAM: No petechiae, purpura or other abnormal skin lesions are appreciated.  LABS:  Latest Reference Range &  Units 08/10/21 11:00  Prostatic Specific Antigen 0.00 - 4.00 ng/mL 0.01     Latest Reference Range & Units 09/15/20 10:14 12/15/20 10:45 05/11/21 10:33  Prostate Specific Ag, Serum 0.0 - 4.0 ng/mL 0.2 <0.1   Prostatic Specific Antigen 0.00 - 4.00 ng/mL   0.04   ASSESSMENT & PLAN:  A 81 y.o. 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  I am pleased as his PSA remains at an undetectable level.  As that is the case, he will continue taking Lupron for his androgen deprivation therapy.  The patient understands that if his PSA rises over time, I would consider adding another antiandrogen agent to his therapy, as well as repeat scans to assess for possible occult disease metastasis.  Clinically, he appears to be doing well.  I will see him back in 3 months for repeat clinical assessment.  The patient and his wife understand all the plans discussed today and are in agreement with them.  Makayah Pauli Macarthur Critchley, MD

## 2021-11-01 ENCOUNTER — Encounter: Payer: Self-pay | Admitting: Oncology

## 2021-11-10 ENCOUNTER — Inpatient Hospital Stay: Payer: Medicare Other

## 2021-11-14 ENCOUNTER — Ambulatory Visit: Payer: Medicare Other

## 2021-11-14 ENCOUNTER — Ambulatory Visit: Payer: Medicare Other | Admitting: Oncology

## 2021-12-01 ENCOUNTER — Inpatient Hospital Stay: Payer: Medicare Other | Attending: Oncology

## 2021-12-04 ENCOUNTER — Encounter: Payer: Self-pay | Admitting: Oncology

## 2021-12-05 ENCOUNTER — Ambulatory Visit: Payer: Medicare Other

## 2021-12-05 ENCOUNTER — Ambulatory Visit: Payer: Medicare Other | Admitting: Oncology

## 2022-12-11 DEATH — deceased
